# Patient Record
Sex: Female | Born: 1985 | Race: White | Hispanic: No | Marital: Single | Smoking: Current every day smoker
Health system: Southern US, Community
[De-identification: ages and names within clinical notes are randomized; demographics above are authoritative.]

## PROBLEM LIST (undated history)

## (undated) DIAGNOSIS — J45909 Unspecified asthma, uncomplicated: Secondary | ICD-10-CM

## (undated) DIAGNOSIS — T7840XA Allergy, unspecified, initial encounter: Secondary | ICD-10-CM

## (undated) DIAGNOSIS — G8929 Other chronic pain: Secondary | ICD-10-CM

## (undated) DIAGNOSIS — M549 Dorsalgia, unspecified: Secondary | ICD-10-CM

## (undated) DIAGNOSIS — M797 Fibromyalgia: Secondary | ICD-10-CM

## (undated) DIAGNOSIS — F319 Bipolar disorder, unspecified: Secondary | ICD-10-CM

## (undated) DIAGNOSIS — F32A Depression, unspecified: Secondary | ICD-10-CM

## (undated) DIAGNOSIS — F329 Major depressive disorder, single episode, unspecified: Secondary | ICD-10-CM

## (undated) HISTORY — DX: Allergy, unspecified, initial encounter: T78.40XA

## (undated) HISTORY — DX: Other chronic pain: G89.29

## (undated) HISTORY — DX: Depression, unspecified: F32.A

## (undated) HISTORY — DX: Unspecified asthma, uncomplicated: J45.909

## (undated) HISTORY — DX: Dorsalgia, unspecified: M54.9

## (undated) HISTORY — DX: Major depressive disorder, single episode, unspecified: F32.9

## (undated) HISTORY — PX: WISDOM TOOTH EXTRACTION: SHX21

---
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
## 2007-12-30 ENCOUNTER — Ambulatory Visit: Payer: Self-pay | Admitting: Obstetrics & Gynecology

## 2008-03-02 ENCOUNTER — Encounter: Payer: Self-pay | Admitting: Obstetrics and Gynecology

## 2008-03-02 ENCOUNTER — Ambulatory Visit: Payer: Self-pay | Admitting: Obstetrics and Gynecology

## 2009-05-15 ENCOUNTER — Inpatient Hospital Stay (HOSPITAL_COMMUNITY): Admission: AD | Admit: 2009-05-15 | Discharge: 2009-05-16 | Payer: Self-pay | Admitting: Obstetrics & Gynecology

## 2009-05-22 ENCOUNTER — Inpatient Hospital Stay (HOSPITAL_COMMUNITY): Admission: AD | Admit: 2009-05-22 | Discharge: 2009-05-22 | Payer: Self-pay | Admitting: Obstetrics & Gynecology

## 2009-12-19 ENCOUNTER — Encounter: Payer: Self-pay | Admitting: Family Medicine

## 2009-12-19 ENCOUNTER — Ambulatory Visit (HOSPITAL_COMMUNITY): Admission: RE | Admit: 2009-12-19 | Discharge: 2009-12-19 | Payer: Self-pay | Admitting: Family Medicine

## 2010-01-12 ENCOUNTER — Ambulatory Visit (HOSPITAL_COMMUNITY): Admission: RE | Admit: 2010-01-12 | Discharge: 2010-01-12 | Payer: Self-pay | Admitting: Family Medicine

## 2010-01-29 ENCOUNTER — Ambulatory Visit (HOSPITAL_COMMUNITY): Admission: RE | Admit: 2010-01-29 | Discharge: 2010-01-29 | Payer: Self-pay | Admitting: Family Medicine

## 2010-01-29 ENCOUNTER — Encounter: Payer: Self-pay | Admitting: Family Medicine

## 2010-05-01 ENCOUNTER — Ambulatory Visit: Payer: Self-pay | Admitting: Gynecology

## 2010-05-01 ENCOUNTER — Inpatient Hospital Stay (HOSPITAL_COMMUNITY): Admission: AD | Admit: 2010-05-01 | Discharge: 2010-05-02 | Payer: Self-pay | Admitting: Family Medicine

## 2010-05-04 ENCOUNTER — Ambulatory Visit: Payer: Self-pay | Admitting: Advanced Practice Midwife

## 2010-05-04 ENCOUNTER — Inpatient Hospital Stay (HOSPITAL_COMMUNITY): Admission: AD | Admit: 2010-05-04 | Discharge: 2010-05-05 | Payer: Self-pay | Admitting: Obstetrics and Gynecology

## 2010-06-23 ENCOUNTER — Inpatient Hospital Stay (HOSPITAL_COMMUNITY): Admission: AD | Admit: 2010-06-23 | Discharge: 2010-06-23 | Payer: Self-pay | Admitting: Obstetrics & Gynecology

## 2010-07-07 ENCOUNTER — Inpatient Hospital Stay (HOSPITAL_COMMUNITY): Admission: AD | Admit: 2010-07-07 | Discharge: 2010-07-09 | Payer: Self-pay | Admitting: Obstetrics and Gynecology

## 2010-11-04 ENCOUNTER — Encounter: Payer: Self-pay | Admitting: Family Medicine

## 2010-11-15 ENCOUNTER — Other Ambulatory Visit: Payer: Self-pay | Admitting: Obstetrics and Gynecology

## 2010-12-27 LAB — CBC
HCT: 32.2 % — ABNORMAL LOW (ref 36.0–46.0)
HCT: 33.6 % — ABNORMAL LOW (ref 36.0–46.0)
HCT: 37.2 % (ref 36.0–46.0)
Hemoglobin: 11.1 g/dL — ABNORMAL LOW (ref 12.0–15.0)
Hemoglobin: 11.6 g/dL — ABNORMAL LOW (ref 12.0–15.0)
MCH: 32.1 pg (ref 26.0–34.0)
MCH: 32.2 pg (ref 26.0–34.0)
MCHC: 34.4 g/dL (ref 30.0–36.0)
MCHC: 34.5 g/dL (ref 30.0–36.0)
MCHC: 34.6 g/dL (ref 30.0–36.0)
MCV: 93.1 fL (ref 78.0–100.0)
MCV: 93.3 fL (ref 78.0–100.0)
Platelets: 234 K/uL (ref 150–400)
Platelets: 255 K/uL (ref 150–400)
Platelets: 294 10*3/uL (ref 150–400)
RBC: 3.46 MIL/uL — ABNORMAL LOW (ref 3.87–5.11)
RBC: 3.6 MIL/uL — ABNORMAL LOW (ref 3.87–5.11)
RDW: 13.7 % (ref 11.5–15.5)
RDW: 13.8 % (ref 11.5–15.5)
RDW: 13.9 % (ref 11.5–15.5)
WBC: 15.6 K/uL — ABNORMAL HIGH (ref 4.0–10.5)
WBC: 21.8 K/uL — ABNORMAL HIGH (ref 4.0–10.5)

## 2010-12-27 LAB — SYPHILIS: RPR W/REFLEX TO RPR TITER AND TREPONEMAL ANTIBODIES, TRADITIONAL SCREENING AND DIAGNOSIS ALGORITHM: RPR Ser Ql: NONREACTIVE

## 2010-12-27 LAB — RH IMMUNE GLOB WKUP(>/=20WKS)(NOT WOMEN'S HOSP): Fetal Screen: NEGATIVE

## 2010-12-27 LAB — URINALYSIS, DIPSTICK ONLY
Ketones, ur: 40 mg/dL — AB
Leukocytes, UA: NEGATIVE
Nitrite: NEGATIVE
Protein, ur: NEGATIVE mg/dL
Specific Gravity, Urine: 1.015 (ref 1.005–1.030)
Urobilinogen, UA: 0.2 mg/dL (ref 0.0–1.0)
pH: 7 (ref 5.0–8.0)

## 2010-12-29 LAB — URINALYSIS, ROUTINE W REFLEX MICROSCOPIC
Bilirubin Urine: NEGATIVE
Glucose, UA: NEGATIVE mg/dL
Glucose, UA: NEGATIVE mg/dL
Ketones, ur: NEGATIVE mg/dL
Nitrite: NEGATIVE
Specific Gravity, Urine: 1.025 (ref 1.005–1.030)
pH: 6 (ref 5.0–8.0)

## 2010-12-29 LAB — URINE MICROSCOPIC-ADD ON

## 2010-12-29 LAB — URINE CULTURE: Colony Count: 30000

## 2010-12-29 LAB — CBC
HCT: 32.6 % — ABNORMAL LOW (ref 36.0–46.0)
MCH: 31.5 pg (ref 26.0–34.0)
Platelets: 248 10*3/uL (ref 150–400)
RBC: 3.57 MIL/uL — ABNORMAL LOW (ref 3.87–5.11)
RDW: 14 % (ref 11.5–15.5)
WBC: 10.9 10*3/uL — ABNORMAL HIGH (ref 4.0–10.5)

## 2010-12-29 LAB — WET PREP, GENITAL: Yeast Wet Prep HPF POC: NONE SEEN

## 2011-01-19 LAB — HCG, QUANTITATIVE, PREGNANCY
hCG, Beta Chain, Quant, S: 12 m[IU]/mL — ABNORMAL HIGH (ref <5)
hCG, Beta Chain, Quant, S: 19 m[IU]/mL — ABNORMAL HIGH (ref <5)

## 2011-01-19 LAB — CBC
Hemoglobin: 12.4 g/dL (ref 12.0–15.0)
MCHC: 34.9 g/dL (ref 30.0–36.0)
RBC: 3.9 MIL/uL (ref 3.87–5.11)
WBC: 13.7 10*3/uL — ABNORMAL HIGH (ref 4.0–10.5)

## 2011-01-19 LAB — GC/CHLAMYDIA PROBE AMP, GENITAL: Chlamydia, DNA Probe: NEGATIVE

## 2011-01-19 LAB — HCG, SERUM, QUALITATIVE: Preg, Serum: POSITIVE — AB

## 2011-02-26 NOTE — Group Therapy Note (Signed)
NAME:  Sarah Mcclure, Sarah Mcclure NO.:  [id number]   MEDICAL RECORD NO.:  [id number]          PATIENT TYPE:  WOC   LOCATION:  [facility]                   FACILITY:  [facility]   PHYSICIAN:  Elsie Lincoln, MD      DATE OF BIRTH:  28-May-1986   DATE OF SERVICE:  12/30/2007                                  CLINIC NOTE   Patient is a 25 year old para 1 female who presents after a ASCUS Pap  smear with positive HPV.  Colpo revealed CIN 1, and pathology showed  detached fragment of atypical squamous metaplasia.  I called Dr. Dierdre Searles in  pathology, and __________ called it atypical squamous metaplasia because  it was just some random cells, and they were just oriented, and there  was no underlying stroma, so it was hard to say what type of thickness  it was, so it could be dysplasia, or it could just be some random cells  and some metaplasia.  Given that she is only 25 years old, and there was  good visualization of the T zone, we are going to elect to follow her  with Pap smears here at [facility] [place] by the MDs.  If  she does have an abnormal Pap smear again in two months, I think she  should have another colposcopy here at [facility].  She does need birth control because her IUD fell out during the last  colposcopy, and I gave her a prescription for NuvaRing.  Patient is  familiar with this method of birth control.  Patient is to come in two  months for a Pap smear.           ______________________________  Elsie Lincoln, MD     KL/MEDQ  D:  12/30/2007  T:  12/30/2007  Job:  [id number]
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
## 2011-03-01 NOTE — H&P (Signed)
NAME:  Sarah Mcclure, Sarah Mcclure                            ACCOUNT NO.:  [id number]   MEDICAL RECORD NO.:  [id number]                   PATIENT TYPE:  INP   LOCATION:  0100                                 FACILITY:  [facility]   PHYSICIAN:  Beverly Milch, MD                  DATE OF BIRTH:  [birth year]   DATE OF ADMISSION:  03/21/2004  DATE OF DISCHARGE:                         PSYCHIATRIC ADMISSION ASSESSMENT   IDENTIFYING DATA:  This 55-52/25-year-old female, who made some type of  personal arrangements with the 11th grade at [employer] to  finish when she could so that she could live in [place] with mother  rather than with grandmother in [place], is admitted voluntarily in  transfer from [facility] for inpatient  stabilization of depression and anxiety.  The patient was transferred to [facility]  from 1-2 day stay, June 2 and June 3, in transfer from [facility]  as her acute renal failure did not improve but actually exacerbated.  The  patient was vomiting and too physically ill to participate in mental health  treatment.  They did not give at [facility] but rather managed  electrolytes, hydration and nutrition through the course of her  spontaneously gradually remitting acute renal failure so that she did not  also require dialysis.  However, they did feel she could be trusted to  remain safe outside the hospital setting with the patient having only 24  hours without somatic complaints prior to discharge and with some  restoration of energy, interest in participation.  The patient indicates she  has not used any drugs in the interim, so that she has now been free from  marijuana for at least a week.  She has now not had any Lexapro for over a  week.  The patient did have a MRI of the brain and an EKG while at [facility] and her workup for acute renal failure was otherwise negative  except for the overdose of 55 Coricidin Cough & Cold  that necessitated her  original presentation to [facility] and the [facility].  Renal failure was concluded to be due to the overdose but remitting rather  than prerenal as [facility] initially suspected.  The patient also admitted  to overdosing with up to 5 Lexapro in the interim since her January 31, 2004  to February 06, 2004 [facility] hospitalization when she was at  [facility] and noted that she had been noncompliant with the Lexapro as she had run  out early.   HISTORY OF PRESENT ILLNESS:  The patient is known to me from her extensive  dictations of her last two-day hospitalization, including admission  assessment and discharge summary.  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  [facility] was worried that the patient also has an eating disorder though,  from reviewing again, with the patient and mother, the patient was  comfortable eating and likes to eat and has no body image distortion.  However, she has lost 10 pounds since her last admission, though she was in  acute renal failure when she was last admitted.  She had gained weight from  her first hospitalization the end of April, losing from 114 pounds to 110  pounds to 117 pounds during her March 15, 2004 admission.  She now returns at  107 pounds.  The patient has multiple contributing factors to her general  medical symptoms, especially with the gastrointestinal tract.  She may now  be having some SSRI discontinuation, though she only complained of some  dizziness and some headache 18 hours after admission.  The patient uses  cannabis with her older sister, who serves as a surrogate mother.  Mother is  no longer using alcohol and escaped a near-death experience from  consequences of her substance abuse with alcohol two years ago, according to  the patient.   Mother and the patient were opposed to the [facility] inpatient care and had avoided all medical care for three days after  her overdose with 55 Coricidin, self-medicating the patient with Phenergan  and monitoring by the grandmother, who is a nurse until she was sent by  [facility] back to the [facility].  The patient had taken  Zoloft one year ago and reported at [facility] that she had some improvement on  Zoloft but that it caused gastrointestinal side effects.  She had informed  me previously that Zoloft did not result in any improvement but did cause GI  side effects.  She has tolerated the Lexapro well but Lexapro was held by  nursing because of her vomiting the night before transfer to [facility] and then  was held by [facility] as they treated the acute renal failure as that had higher  priority than depression or any SSRI discontinuation symptoms.  She returns  in a good mood, being motivated to get better.  She does seem apprehensive  about returning to mother's home, even though she hesitates to acknowledge  such.  She has been in outpatient psychotherapy for 14 months with Emiliano Dyer at [facility] and has been seeing Dr. Marlou Porch at [facility] for psychiatry care since her hospitalization in late April at  [facility].  Mother and patient sought to see Emiliano Dyer and Dr. Marlou Porch instead of coming to the [facility]  after the patient's serious overdose but no immediate appointments were  available and [facility] insisted that the patient be  taken to [facility] because of her decompensation medically  following the overdose.   PAST MEDICAL HISTORY:  The patient's hypertension, on readmission to the  [facility], March 15, 2004 and again at [facility], has required p.r.n. hydralazine while at [facility] and she was discharged on a dose of  hydrochlorothiazide 12.5 mg daily as  1/2 of a 25 mg tablet.  As mentioned  above, the patient had a CT scan and not a MRI while at [facility], which was  negative and showed no evidence of a bleed and no evidence of any factor  contributing to vomiting and renal failure.  EKG was also normal and showed  no hypertensive changes, particularly no  chronicity to her elevated blood  pressure.  The patient's blood pressure was 144/97 and 135/81 on the day of  discharge from [facility].  Her weight was down from 51.6 to 46.9 kilograms.  Her  streptococcal antibodies were negative, showing no evidence of  glomerulonephritis.  Her creatinine appeared to be down to 1.9 from the  preceding level of 2.4 down from her 7.9 at the time of transfer to [facility].  Calorie count was underway and they feared the patient may have some eating  disorder symptoms, though overall they have given nutritional supplements  and the patient states that she loves to eat.  The patient has healing scars  from self-cutting but she has no active wounds except from intravenous sites  and venipunctures.  The patient had a blood pressure of 151/101 when  admitted on March 15, 2004 to the [facility].  She had been  drinking mostly water at home and her urine sodium was up at 47 along with  her urine protein being 30 and she had some blood in her urine by the time  of discharge.  The patient thinks something is wrong with her liver at the  time of her last readmit and seems to identify with her mother.  Some of her  vomiting may be anxious in origin.  She had taken Zantac in the past for pre-  ulceration of a peptic nature in the fourth grade.  She had multiple  contusions from falls when intoxicated with Coricidin.  She had an abortion  in January of 2005.  She has allergic rhinitis requiring Clarinex p.r.n.  She has had no syncope or seizures.  She has had no heart murmur or  arrhythmia.   REVIEW OF SYSTEMS:  The patient has no somatic complaints at the time of her   readmission but subsequently develops headache, dizziness and vomiting again  episodically.  She has never had porphyria or other endocrine or metabolic  diagnoses.  She did not have any known migraine.  She has hyponatremia in  the emergency room at [facility] with a sodium of 130 that normalized  during her hospital stay.  Her potassium had also been low and normalized.  Her phosphorus was rising during her last hospital stay and, at [facility], these  abnormalities were all normal and regulated by the time of transfer except  for elevated creatinine.  She has no dysuria or arthralgia.   IMMUNIZATIONS:  Up to date.   FAMILY HISTORY:  There is a maternal family history of depression and  paternal family history of anxiety, according to the last admission.  Mother  has had alcoholism, as has father with father being emotionally abusive to the family.  Mother is sober now but we are not certain about father.  Father is not involved in the patient's life currently.  Older sister uses  cannabis with the patient, though the patient predominantly uses her own  supply and that with friends.  The patient indicates using cannabis to cope  and, although she states she is planning to cut down greatly, she will not  yet state that she is going be totally abstinent.  The patient remains  sexually active.  She has switched from the birth control patch to the birth  control pill and then has discontinued all, though apparently planning to  start some type of contraceptive in an upcoming cycle.  The patient did not  get back to school for her EOGs or other exams  but overdosed the day before  she and mother were to come for such testing.  The patient indicated that  she overdosed Mar 11, 2004 because she was failing in all of her  relationships outside of the family and boyfriend.  The patient has not been  more specific in this regard.  Mother indicates the patient will have some  type of pre-employment and  pre-hire education course over the next month and  then will finish summer school work to complete the 11th grade, though the  patient is hoping she can work with animals at [employer] in a job pretty  soon.   ASSETS:  The patient is intellectually capable of benefiting from treatment.   MENTAL STATUS EXAM:  Admission weight is 107 pounds with height of 61  inches.  Blood pressure 156/99 and heart rate of 69 (sitting) and 140/101  with heart rate of 104 (standing).  On the morning after admission, her  blood pressure was 127/84 with heart rate of 87 (supine) and 133/94 with  heart rate of 105 (standing).  The patient is not somatic in her complaints  on return and she indicates a comfort being at the hospital rather than a  conflict.  She does acknowledge that she is not suicidal but [facility] had not  been able to trust her during her inpatient stay on nephrology.  The patient  states she is doing better and functioning better but she does then resume  somatoform concerns, particularly as mother arrives.  The patient had been  more somatic during her last hospital stay with mother and grandmother.  The  patient noted a willingness to resume antidepressant and could agree that  Lexapro would have been a good medicine, although she was open to whatever  was necessary.  The patient reemphasized that she is sober and that she is  proud of being able to go for over a week without cannabis, though she is  not willing to commit to total abstinence but rather to cutting down.  She  and I worked on the need to be able to commit to total sobriety.  [facility] has  been concerned that the patient shows some diathesis towards suicidality but  she has not been suicidal or self-injurious.  The patient is less depressed  but more anxious on return but she did not have somatic complaints until approximately 18 hours into her hospitalization, initially complaining of  dizziness and headache and needing to lay down  and then, subsequently,  complaining of an episode of nausea and vomiting with greasy food.   IMPRESSION:   AXIS I:  1. Dysthymic disorder, early onset, severe with atypical features.  2. Generalized anxiety disorder with social anxiety features.  3. Cannabis dependence.  4. Coricidin abuse.  5. Parent-child problem.  6. Other interpersonal problem.  7. Other specified family circumstances.  8. Noncompliance with treatment.   AXIS II:  Diagnosis deferred.   AXIS III:  1. Resolving acute renal failure due to Coricidin overdose.  2. Hypertension associated with acute renal failure due to Coricidin     overdose.  3. Allergic rhinitis.  4. Allergy to PENICILLIN.  5. Ten-pound weight loss associated with nausea and vomiting, likely     associated with above #6.  6. Pregnancy termination by abortion in January of 2005.  7. History of pre-ulcerative gastritis.  8. Recent discontinuation of birth control pills.  9. Lexapro discontinuation symptoms.   AXIS IV:  Stressors:  Family--severe to extreme,  acute and chronic; peer  relations--severe, acute and chronic; school--severe, acute; phase of life--  severe, acute; medical--severe, acute.   AXIS V:  Global Assessment of Functioning on admission 44; highest in last  year 70.   PLAN:  The patient was admitted for inpatient adolescent psychiatric and  multidisciplinary, multimodal behavioral health treatment in a team-based  program at a locked psychiatric unit.  Will reinstitute Lexapro at 10 mg  nightly.  Will use Zofran as needed for nausea and vomiting as Phenergan did  not work well last admission and mother declines Ativan for this purpose.  The patient is off of birth control pills but did plan to restart  contraception in the future.  Will address behavioral nutrition, start a  multivitamin and monitor blood pressure and mood.  Cognitive behavioral  therapy, substance abuse intervention and family therapy are continued.    ESTIMATED LENGTH OF STAY:  Three to four days with target symptoms for  discharge being stabilization of depression and anxiety, re-establishment of  Lexapro, generalization of safety and containment for safe participation in  outpatient treatment and stabilization of behavioral nutrition.                                               Beverly Milch, MD    GJ/MEDQ  D:  03/22/2004  T:  03/22/2004  Job:  [id number]
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
## 2011-03-01 NOTE — Discharge Summary (Signed)
NAME:  Sarah Mcclure, Sarah Mcclure                            ACCOUNT NO.:  [id number]   MEDICAL RECORD NO.:  [id number]                   PATIENT TYPE:  INP   LOCATION:  0100                                 FACILITY:  [facility]   PHYSICIAN:  Beverly Milch, MD                  DATE OF BIRTH:  [birth year]   DATE OF ADMISSION:  03/21/2004  DATE OF DISCHARGE:  03/24/2004                                 DISCHARGE SUMMARY   IDENTIFYING DATA:  This 40-41/25-year-old female, who is attempting to  complete her 11th grade year at [employer], was admitted  voluntarily in transfer from [facility] pediatric nephrology for  stabilization of depression and suicide risk as her previous treatment at  the [facility] June 2 and March 16, 2004 was interrupted by her  acute renal failure, requiring transfer to [facility].  It was not possible for the  patient to work on her mental health needs while at [facility], though she did not  require dialysis.  She was discharged on hydrochlorothiazide 12.5 mg daily,  though she had required hydralazine p.r.n. to control her blood pressure  during her stay at [facility].  She had been discontinued from other medications,  including her Lexapro, though they had no contraindication or objection to  her being on the Lexapro, but could not attend to the mental health needs  while being treated for acute renal failure.  The patient did acknowledge,  while at [facility], that she had overdosed with up to 5 Lexapro in the interim  since her hospitalization at the [facility] at the end of  April of 2005 and she therefore had been noncompliant with Lexapro at times  including because she had run out too soon.  The patient continued to use  cannabis daily and her renal failure was concluded to be due to a suicide  attempt with 55 Coricidin Cold & Cough tablets, though she also uses  Coricidin recreationally.  For full details, please see the typed admission  assessment.   HISTORY OF PRESENT ILLNESS:  The patient is concluded to have cannabis  dependence as being more evident than during her April hospitalization when  she was more depressed and cannabis abuse was concluded.  The patient had  gained weight from that hospitalization at the end of April, having been  admitted at the end of April with weight of 114, dropping to 110 pounds  before discharge.  She gained weight to 117 pounds at the time of her  admission March 15, 2004 but, while at [facility], dropped to 107 pounds with [facility]  worrying that she may have an eating disorder, though the patient had been  vomiting continually during much of her acute renal failure.  The patient  had attempted to wait out the patient's overdose consequences at  grandmother's home with the patient overdosing Mar 11, 2004 the night  before  she was to return to [place] and take tests at [facility].  She reported that she had been planning suicide for several weeks,  due to disappointment with relationships, apparently outside the family.  She continues to use cannabis with her older sister, who is a surrogate  mother.  Mother apparently remains sober from alcohol, having nearly died  from alcohol consequences a couple of years ago.  The patient has been in  therapy with Emiliano Dyer for 14 months and is now seeing Dr. Marlou Porch for  psychiatric follow-up.  The family was willing to see them after her  overdose Mar 11, 2004 but was not willing to be at the [facility] again, though she was required to do so once the [facility] got her to [facility].  Her EKG and CT of the head at [facility]  were normal, though her blood pressure was elevated.  Her creatinine was  apparently down to the 1.9 to 2.4 range from the 7.9 at the time of  transfer.  She had hyponatremia in the emergency room at [facility] March 15, 2004 with a sodium of 130 when in renal failure and her phosphorus was   rising as well as her potassium with the phosphorus being elevated by the  time of her transfer.   INITIAL MENTAL STATUS EXAM:  The patient denied suicidality but [facility]  concluded that she was not trustworthy during her stay there.  However, her  mood was improved and she was more willing to work on her mental health and  behavioral concerns on return to the [facility] from [place].  She  was much less somatic on return to the [facility] but, by 24  hours into her admission, she was having multiple somatic complaints  including vomiting again.  She was more anxious but less depressed overall  and still not working effectively on her problems but more seeming to depend  on mother and grandmother to nurture her because she was sick.   LABORATORY DATA:  During her readmission this time, the patient had only one  basic metabolic panel on March 23, 2004.  Her creatinine was restored to  normal at 1.4 with reference range 0.5-1.7 and BUN 20.  Her potassium was  low at 2.8 with reference range 3.5-5.5.  She had started vomiting again for  18-24 hours preceding that test, though only intermittently and she was on  hydrochlorothiazide for her hypertension.  Sodium was normal at 137,  chloride 96, glucose 89, CO2 28, calcium 9.8.   HOSPITAL COURSE AND TREATMENT:  General medical exam, by Vic Ripper,  P.A.-C., noted excision sites for suspicious nevi on the left forearm.  There was menarche at age 4 and menses are regular.  She is sexually active  and had abortion of a pregnancy in January of 2005.  She had been anemic in  the past.  Her last GYN exam was one month ago [facility] the [facility].  Mickie Deery Adams, P.A.-C. preferred nephrology consult, though the patient  was not to be in the hospital more than a brief [facility] sponsorship and she had just been discharged from [facility] pediatric nephrology.  Mickie Deery Adams, P.A.-C. ordered  clonidine 0.1 mg for a  blood pressure of 159/108 despite hydrochlorothiazide having been increased  that morning to 25 mg daily.  There was also the need to supplement  potassium chloride elixir for low potassium, receiving 40 mEq the first day  and 20 mEq daily thereafter.  The patient did find that Zofran stabilized  her nausea and vomiting which may have been associated with headaches and  possibly Lexapro discontinuation.  The patient's mood was improved and she  was working much more effectively on problems and generalizing this to home.  She was motivated to complete the remainder of her school testing for the  11th grade and she and mother had specific plans about summer activities  including preparation for employment.  She was free of suicidal ideation at  the time of discharge.   FINAL DIAGNOSES:   AXIS I:  1. Dysthymic disorder, early onset, severe with atypical features.  2. Generalized anxiety disorder with social anxiety features.  3. Cannabis dependence.  4. Coricidin abuse.  5. Parent-child problem.  6. Other interpersonal problem.  7. Other specified family circumstances.  8. Noncompliance with treatment.   AXIS II:  Diagnosis deferred.   AXIS III:  1. Resolving acute renal failure due to Coricidin overdose.  2. Hypertension associated with acute renal failure.  3. Allergic rhinitis.  4. Allergy to PENICILLIN.  5. Weight loss.  6. Pregnancy termination by abortion in January of 2005.  7. Recent discontinuation of birth control pills and Lexapro.  8. Hypokalemia, at least partly associated with hydrochlorothiazide     treatment.   AXIS IV:  Stressors:  Family--severe to extreme, acute and chronic; peer  relations--severe, acute and chronic; school--severe, acute; phase of life--  severe, acute; medical--severe, acute.   AXIS V:  Global Assessment of Functioning on admission 44; highest in last  year 70; discharge Global Assessment of Functioning 54.    CONDITION ON DISCHARGE:  The patient was discharged to mother in improved  condition free of suicidal ideation.  The patient was tolerating her  medications well by the time of discharge with resolution of vomiting.  The  patients' blood pressure on admission had been 141/97 with heart rate of 56  (sitting) and 130/91 with heart rate of 70 (standing).  Her maximum blood  pressure, during the hospital stay, was 147/109 (standing).  On the morning  of discharge, her supine blood pressure was 113/73 with heart rate of 71 and  standing blood pressure 118/84 with heart rate of 95.   DISCHARGE MEDICATIONS:  1. Lexapro 10 mg nightly.  2. Zofran 4 mg every six hours as needed for nausea.  3. Hydrochlorothiazide 25 mg every morning.  4. Potassium chloride elixir 20 mEq every morning.   DIET:  The patient was discharged on a bland diet to tolerance to gradually  restore to a low-sodium diet.  ACTIVITY:  She has no restrictions on physical activity.   FOLLOW UP:  She is to abstain from any use of cannabis, Coricidin or other  substances of abuse.  She needs substance abuse treatment at the [facility].  She will have her individual and family therapy with Emiliano Dyer  with mother setting up that follow-up appointment.  She will see Dr. Marlou Porch  and Elon Spanner for psychiatric medication management with appointment  March 29, 2004 at 10 a.m. and April 04, 2004 at 16:30.  She will seek  substance abuse treatment at the [facility] and will have medical  management, including of her hypertension with Dr. Lucky Cowboy.  Crisis  and safety plans are outlined if needed.  Beverly Milch, MD    GJ/MEDQ  D:  03/26/2004  T:  03/26/2004  Job:  [id number]   cc:   Lucky Cowboy, M.D.  [address], [address]  [place], [place] [address]  Fax: [phone]   Emiliano Dyer  [address]  [place], [place] [address]   Idalia Needle  [address], [address]  [place] [address]  Fax: [phone]   [facility]  [facility]  [address] [place], [place] [address]   [facility]  [address] [place], [place] [address]

## 2011-03-01 NOTE — Discharge Summary (Signed)
NAME:  Sarah Mcclure, Sarah Mcclure                            ACCOUNT NO.:  [id number]   MEDICAL RECORD NO.:  [id number]                   PATIENT TYPE:  INP   LOCATION:  0105                                 FACILITY:  [facility]   PHYSICIAN:  Beverly Milch, MD                  DATE OF BIRTH:  12-Jun-1986   DATE OF ADMISSION:  03/15/2004  DATE OF DISCHARGE:  03/16/2004                                 DISCHARGE SUMMARY   PATIENT IDENTIFICATION:  This 25 year old female who did not complete the  11th grade at [employer] yet, then mother states she has  the missing exams all structured for the future, is admitted voluntarily in  transfer from [facility] Room on a [facility] sponsorship after Child Protective Services report  initiated intervention in the emergency room for inpatient stabilization of  suicide attempt Mar 11, 2004.  The patient overdosed with 55 Coricidin Cough  and Cold in order to kill herself Mar 11, 2004, in the evening at home in  [place] in [employer].  Mother learned of the overdose the  following day and mother and grandmother apparently nursed the patient  primarily at grandmother's house in [place] May 30 through March 14, 2004,  taking the patient to [facility] ER at the insistence of  [facility] when they did contact mental health seeking  further interventions.  They were trying to avoid psychiatric  hospitalization and stated this both to [facility] and to  [facility] Emergency Room.  The patient reported that she intended to kill  herself because of relationship problems apparently outside of the family  and boyfriend.  She was seeking an appointment with Dr. Marlou Porch at [facility] or with Emiliano Dyer, her therapist of 15 months.  She  had been at the [facility] April 19 through February 06, 2004,  after cutting  herself multiple times, requiring sutures in two of the  lacerations as a suicide attempt at that time.  The patient has been  compliant with Lexapro 10 mg nightly most of time though she has had some  times of being noncompliant.  She has had no definite SSRI discontinuation  symptoms or side effects from Lexapro though she did have some  gastrointestinal disturbance from Zoloft one year ago prescribed by Lucky Cowboy, M.D., at [facility].  For  full details, please see the typed admission assessment.   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  She has atypical features to her depression.  She has used cannabis daily for two years and has used Coricidin  episodically including liquid and pill formulations predominantly  recreationally but now as  a suicide attempt.  The patient suggests that  things are going better with mother and older sister though apparently older  sister uses cannabis with the patient sometimes but the patient states she  generally buys her own or obtains it from the friends that she was killing  herself over.  The patient is apparently discontinuing her birth control  pill and planning to start a birth control ring.  She has used Claritin in  the past and more recently has had Phenergan on an outpatient basis  apparently from grandmother that she has been using for nausea and vomiting.  She did fall multiple times when she was intoxicated with Coricidin with  abrasions and contusions, particularly on the forehead and extremities.  She  had some peptic preulceration in the fourth grade and has taken Zantac at  times p.r.n. for GI difficulties.  She reportedly had a blood reaction as an  infant that has not been clarified.  Her renal function studies were normal  during her last  hospitalization at the end of April including creatinine  0.7.  She has gained weight since then, having lost weight during her last  hospitalization from 113 pounds January 31, 2004, to 110 pounds on February 06, 2004.  However, she is readmitted now with a weight of 117 pounds.  She had  an abortion in January 2005 for pregnancy with her current boyfriend.  She  has had precancerous birthmarks excised from the left forearm in two places.  She has allergic rhinitis.  She had asthma during childhood.  She is  allergic to all PENICILLINS.  They now acknowledge a family history of  depression on the maternal side and anxiety on the paternal though during  her last admission they only acknowledged alcoholism in mother and father,  reporting that mother is now sober after nearly dying from alcohol  complications to the liver two years ago.  The patient states that the  family has decided that working with animals in some form of employment will  be the best solution to the patient's relationship problems.  She has had  some stealing of lighters, candy, and cold pills in the past and has missed  a lot of school by skipping, particularly when living with grandmother in  [place] prior to her last psychiatric hospitalization.   INITIAL MENTAL STATUS EXAM:  The patient seems recently dependent on the  nursing care of mother and grandmother.  She states she has now realized  that they really do need her and love her and she has a reason to live.  She  states she can get over the problems with her friends.  She has no  hallucinations or delusions.  She has no manic symptoms.  Her chronic  depression is less severe than five days ago.  Her generalized anxiety is  better on Lexapro though her depression has been better at times and worse  at others.  Both the patient and mother devalue her last hospitalization  seemingly in the service of explaining of why they did not want her to re-  enter psychiatric  hospital.   LABORATORY FINDINGS:  CBC in the [facility] Room at 1821 hours  March 14, 2004, revealed white count elevated at 13,600 with hemoglobin normal  at 13.6, MCV at 85, and platelet count 295,000.  In the emergency room at  the same time, the comprehensive metabolic panel revealed sodium low at 130  with lower limit of normal 135,  glucose 110, BUN 41 with upper limit of  normal 23, and creatinine 7.4 with reference range 0.5-1.7.  Albumin was  slightly low at 3.4 with lower limit of normal 3.5 while calcium was normal  at 9, AST 28, ALT 36, potassium 3.8, chloride 94, and CO2 22 with reference  range 19-32.  During her last hospitalization January 31, 2004, the patient's  sodium was 137, potassium 3.4 with lower limit of normal 3.5, chloride 109,  CO2 23, creatinine 0.7, and BUN 7 with calcium 8.8.  Repeat comprehensive  metabolic panel March 15, 2004, at 1300 hours revealed sodium borderline low  at 134, BUN elevated at 45, creatinine 7.9 with glucose 127, potassium 3.7,  chloride 97, and CO2 24.  On the day of discharge, basic metabolic panel  revealed sodium normal at 137, potassium 4.4, chloride 97, and CO2 27.  Glucose was elevated at 103 fasting, BUN 44, creatinine 7.9, and calcium  9.7.  Urinalysis in the emergency room had urine specific gravity of 1.005,  small amount of occult blood, protein of 30, 0-2 wbc's, 0-2 rbc's, and rare  bacteria with pH 6.5.  The urinalysis was repeated at 1130 on March 15, 2004,  and at 0600 on March 16, 2004, with the urine specific gravity fixed at 1.005  or 1.006, pH 6.5, small occult blood on March 15, 2004, and large March 16, 2004, protein 30 on March 15, 2004, and negative on March 16, 2004, with 0-2  wbc's and 0-2 rbc's on both.  Serum osmolality on the day of discharge was  299 with reference range 275-300 while urine osmolality was 185 with  reference range [phone].  CK was normal at 144 with reference range 24-195.  Urine pregnancy test  was negative.  Free T4 was normal at 1.23 and TSH at  0.879.  Urine drug screen was positive for tetrahydrocannabinol; otherwise  negative.  In the emergency room, acetaminophen was less than 10 and  salicylate less than 4.  At the time of discharge, a urine for potassium,  sodium, and creatinine was being collected but not definitely sent to the  lab before her discharge.   HOSPITAL COURSE AND TREATMENT:  General medical exam at the time of  admission revealed abrasion more than contusion on the right forehead, both  hands, and the lower extremities.  There was full range of motion of the  right shoulder, which was also mechanically painful, having fallen on her  shoulder as well when intoxicated with Coricidin.  The patient's blood  pressures were elevated in the emergency room with maximum value of 151/101.  At the [facility], the patient's height was 62 inches and weight 117 pounds with  blood pressure 141/97 and heart rate 56 sitting on  admission and standing blood pressure 130/91 with heart rate of 70.  At the  time of discharge, supine blood pressure was 123/78 with heart rate of 54  and standing blood pressure 129/98 with heart rate of 68.  The patient had a  urine output of approximately 720 mL over the 12 hours prior to scheduling  her discharge though she vomited approximately 200 mL and drank about 300  mL.  The patient's electrocardiogram in the emergency room revealed sinus  bradycardia with short PR of 104 msec with rate of 56, PR 108, QRS of 104,  and QTc of 443 msec.  The patient continued vomiting despite allowing bed  rest though we did attempt several times to engage  her in the treatment  program and in the family therapy activities.  Family therapy interventions  had to be carried out in the patient's room, therefore.  She did receive  Pepcid but did not receive her Lexapro at bedtime because of vomiting.  Wound care was provided for abrasions with  Neosporin.  She had no other  medications except Phenergan 25 mg two doses, Maalox for one dose, and  Vistaril 50 mg for one dose during her hospital stay.  Therefore, she did  not receive any Lexapro though she had been taking it regularly three days  prior to admission by family history though noncompliant for a few days  intermittently prior to that.  The patient did report some pain that she  thought was in her liver and she seemed to fear that she had done liver  damage like her mother had with alcohol in the past.  Mother thought the  pain was the patient's kidneys.  The patient did not manifest definite SSRI  withdrawal nor other drug withdrawal including from cannabis during her  hospital stay.  She did improve in her mood though still remaining mildly to  moderately dysphoric by the time of discharge.  Her anxiety was much less  than last hospitalization.  Her suicidal ideation remitted even by the time  of hospitalization.  The patient may have been taking some vitamins prior to  admission.  Family therapy and individual therapy were carried out,  particularly relative to anger management, substance abuse intervention and  sobriety, parenting and parent management, as well as compliance.  Mother  did make contact with the patient's outpatient psychotherapist and we  attempted to exchange information with Dr. Deniece Portela Herrick's office including  for setting up subsequent appointments.  Although the PDR mentions that case  reports have been made of persons taking Lexapro having experienced acute  renal failure, they could make no causual association and therefore, there  has been no definite link of Lexapro to acute renal failure.  I did share  this with mother though I have no reservation to the patient continuing  Lexapro though if she goes into more severe renal failure, the dose may need  to be carefully monitored.  As the acute renal failure became more symptomatic than the  suicide attempt and depression, the patient's need for  medical treatment, particularly regarding elevating phosphorus and sustained  elevation of creatinine and BUN require pediatric nephrology treatment.  This was discussed with pediatrics at [facility] including house  staff as well as with Dr. Gregary Signs Hipp at [facility].  Treatment was  available at [facility] and was felt to be best by all and the patient  was transferred there with mother's participation in arranging such as well.  [facility] was notified, particularly regarding their  placement at [facility] for four days.   FINAL DIAGNOSES:   AXIS I:  1. Dysthymic disorder, early onset, severe with atypical features.  2. Generalized anxiety disorder with social anxiety features.  3. Cannabis dependence.  4. Coricidin abuse.  5. Parent-child problem.  6. Other interpersonal problem.  7. Other specified family circumstances.   AXIS II:  Diagnosis deferred.   AXIS III:  1. Acute renal failure.  2. Vomiting and dehydration in the emergency room with failure to respond to     rehydration in the emergency room before transfer to the [facility].  3. Coricidin overdose.  4. Multiple contusions and abrasions.  5. Allergic rhinitis.  6. Allergy to penicillin.  7. History of preulcerative gastritis.  8. Pregnancy managed with abortion in January 2005.  9. Recent discontinuation of birth control pills.   AXIS IV:  Stressors: Family- severe to extreme, acute and chronic; peer  relations- severe, acute and chronic; school- severe, acute; phase of life-  severe, acute.   AXIS V:  Global assessment of functioning at the time of admission was 40  with highest in the last year 70 and discharge global assessment of  functioning 49.   PLAN:  Child Protective Services reporting was initiated in the emergency  room relative to the failure of the family to seek medical  care for the  patient for the overdose and the pattern of suicide attempts prior to crisis  problem-solving.  The patient is in ongoing therapy with Emiliano Dyer in  [place] and they have established communication regarding upcoming  appointment as soon as the patient is released for psychotherapy.  The  patient sees Dr. Idalia Needle and [facility] will call  an appointment to mother for medication management regarding Lexapro.  The  patient is transferred to [facility] in [facility],  Dr. Gregary Signs Hipp.  Continuation of Lexapro 10 mg nightly, quantity #30 with no  refill prescribed is recommended unless pediatric nephrology advises against  continuing it or needs to significantly reduce the dose during the patient's  treatment for acute renal failure.  Crisis and safety plans are outlined  with the patient and mother.  Diet modifications are expected with the patient having received predominantly clear liquids during her hospital  stay.  [facility] is notified of the transfer regarding  the sponsorship.  The patient is psychiatrically stable and cooperative for  the necessary medical care and is emergently transferred to [facility] in  [place].  She is not suicidal at the time of transfer.  Her mood is  improved over admission but still mildly to moderately dysphoric.  Her  anxiety is much less than during her last hospitalization.  There is a  signed release on the chart for the courtesy copies.                                               Beverly Milch, MD    GJ/MEDQ  D:  03/16/2004  T:  03/16/2004  Job:  [id number]   cc:   Elige Radon, M.D.  [facility]  [address]  [place], [place] [address]  Fax [phone]   Emiliano Dyer  [address]  [place], [place] [address]   [facility]  [address]  [place], [place] [address]   Idalia Needle  [address]  [place]  [place] [address]  Fax: [phone]

## 2011-10-15 DIAGNOSIS — G8929 Other chronic pain: Secondary | ICD-10-CM

## 2011-10-15 HISTORY — DX: Other chronic pain: G89.29

## 2012-02-04 IMAGING — US US OB NUCHAL TRANSLUCENCY 1ST GEST
1 series · 14 of 28 positions shown · non-contrast
Comparison: none

OBSTETRICAL ULTRASOUND:
 This ultrasound was performed in The [HOSPITAL], and the [facility] report will be stored to [REDACTED] PACS.  This report is also available in [HOSPITAL]?s accessANYware.

[Series 1: us ob nuchal translucency 1st gest · 14 of 38 slices shown]
[im 2/38]
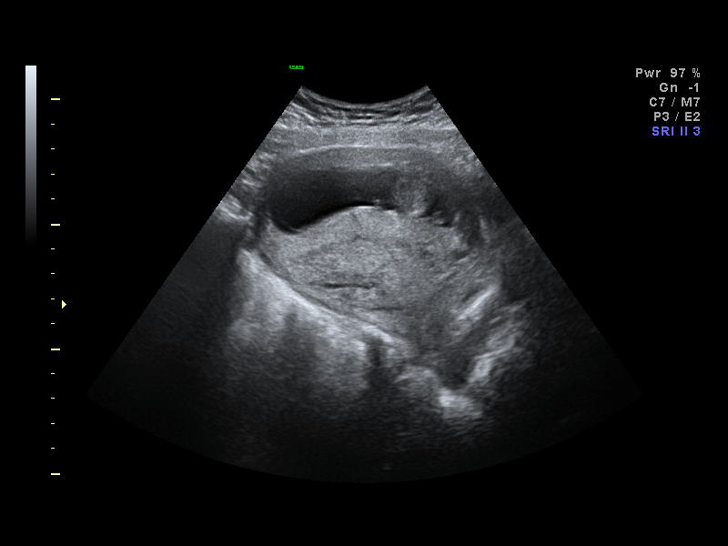
[im 5/38]
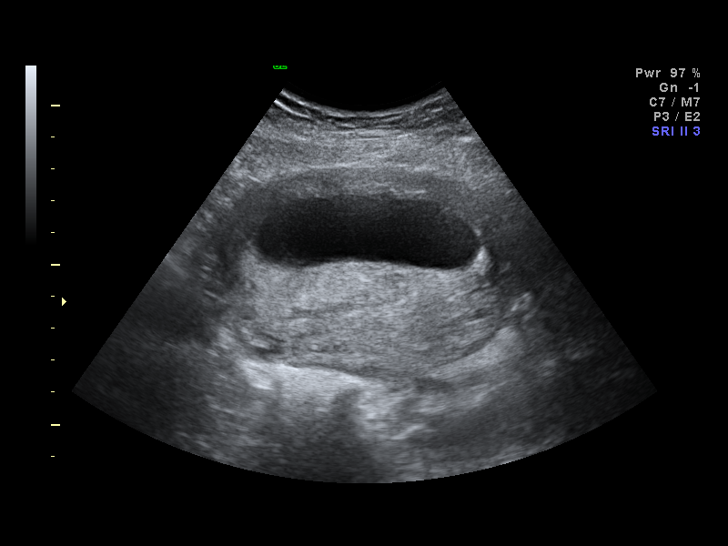
[im 7/38]
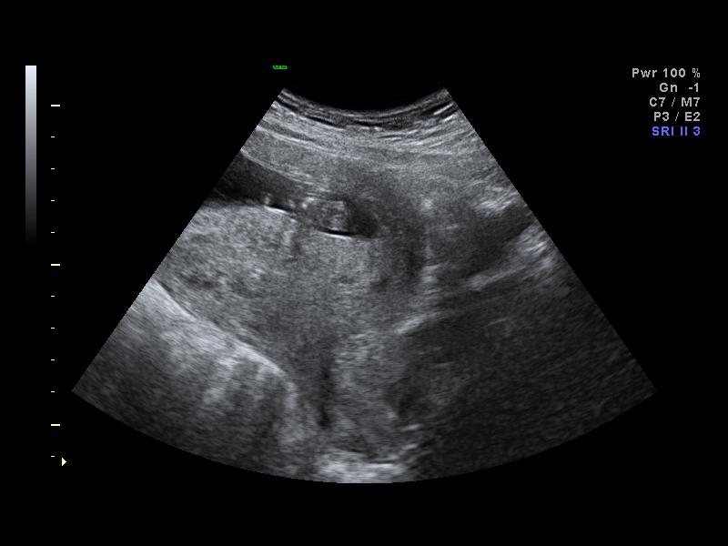
[im 10/38]
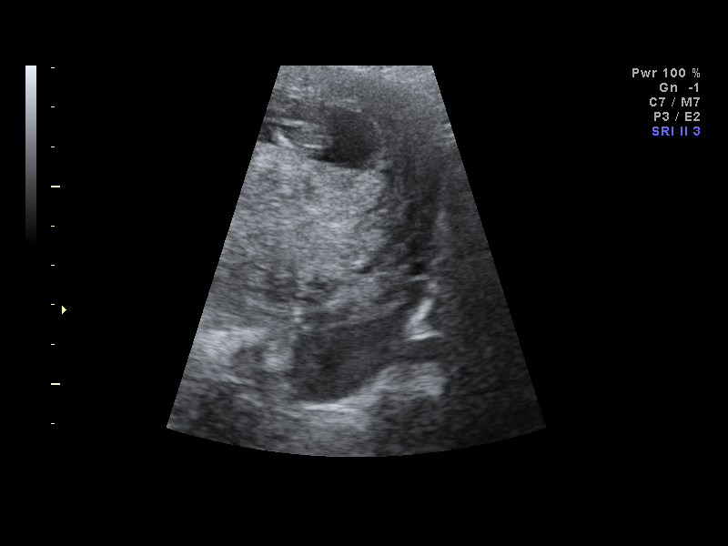
[im 13/38]
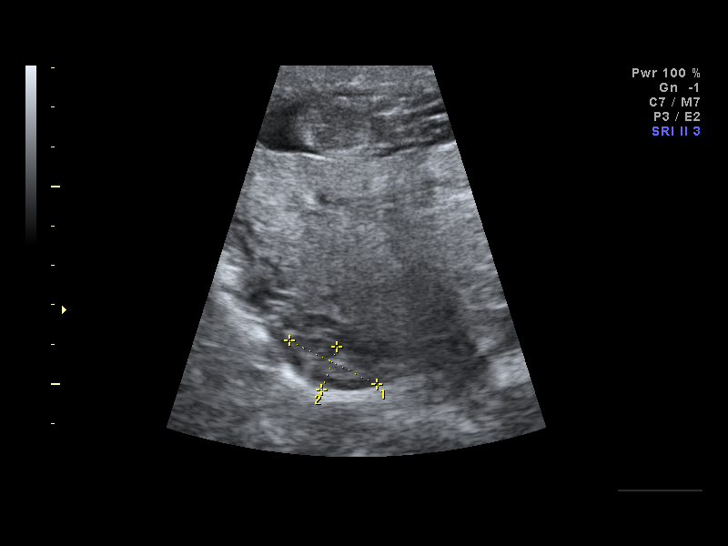
[im 16/38]
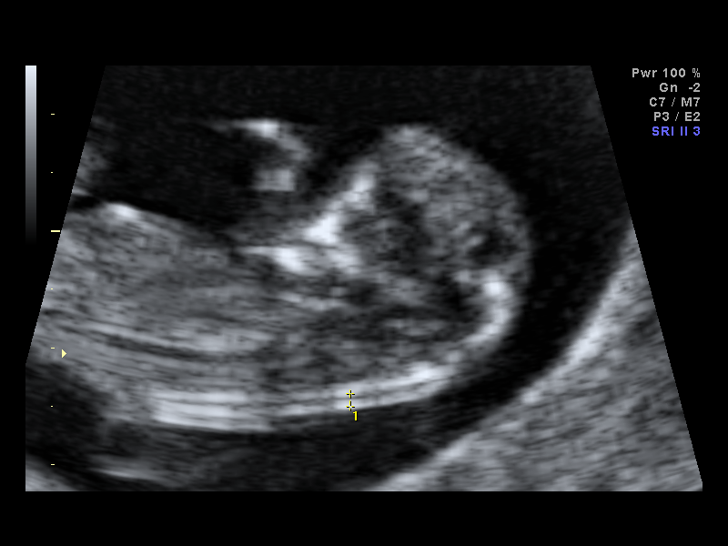
[im 18/38]
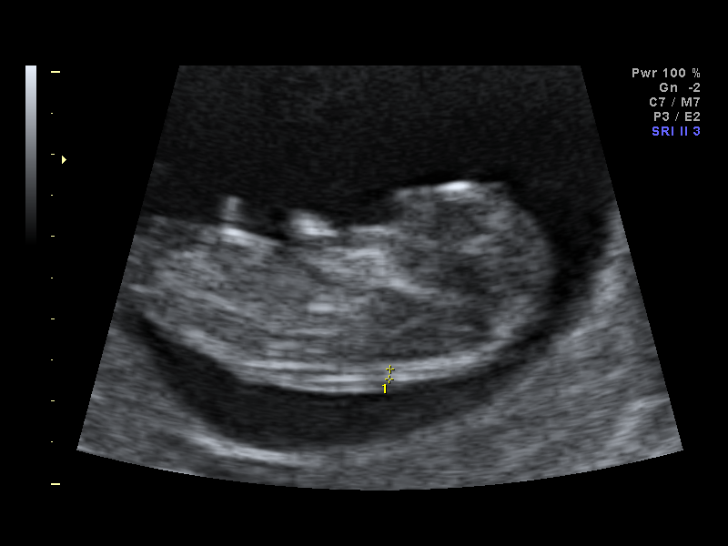
[im 21/38]
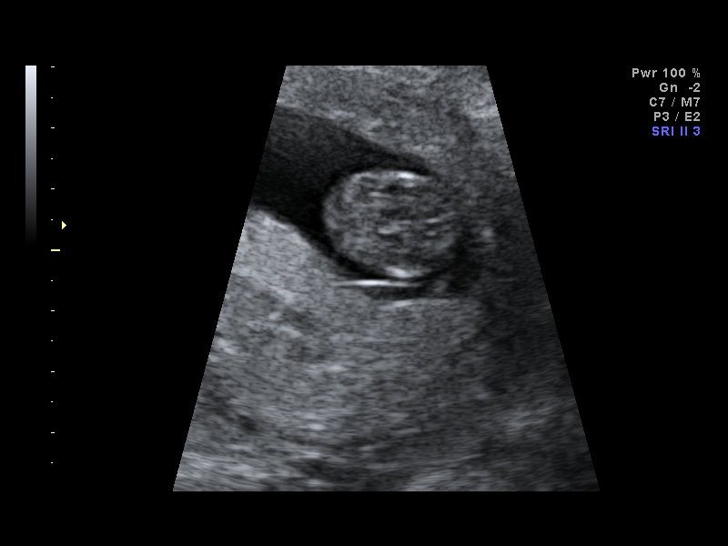
[im 24/38]
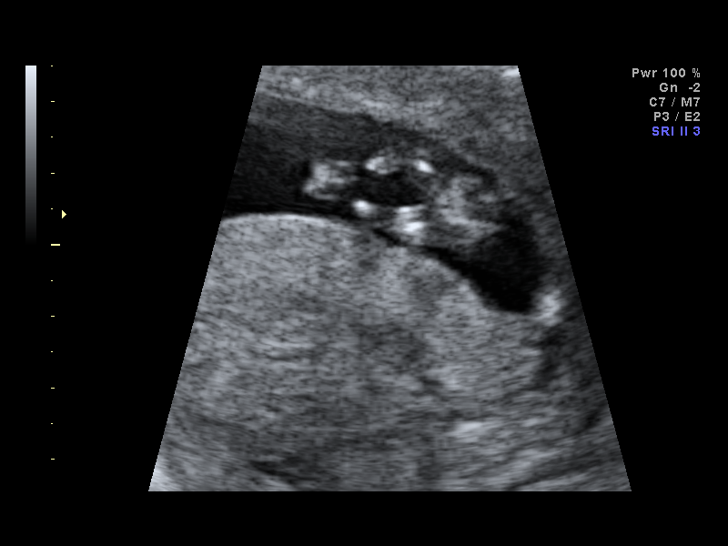
[im 27/38]
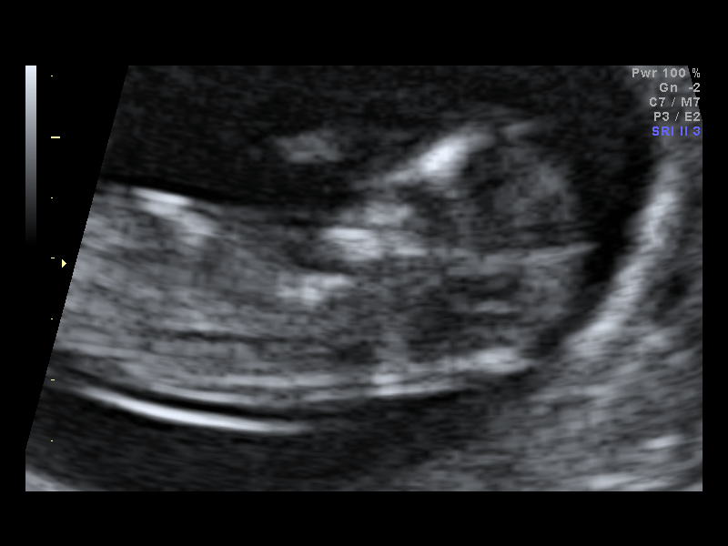
[im 29/38]
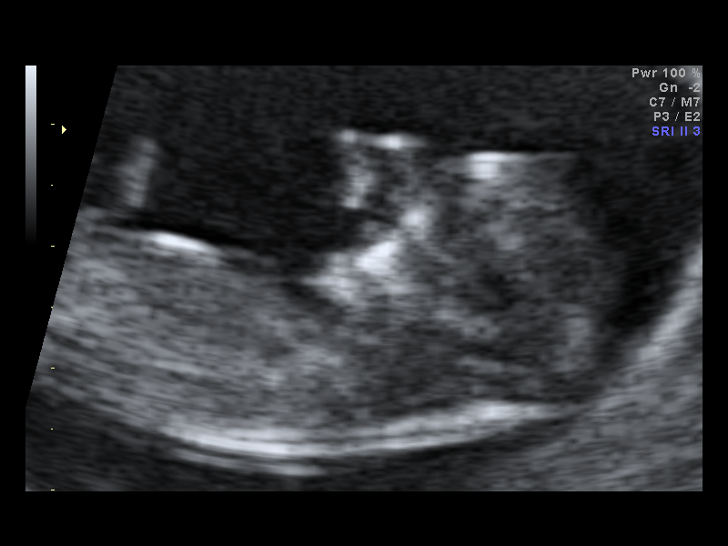
[im 32/38]
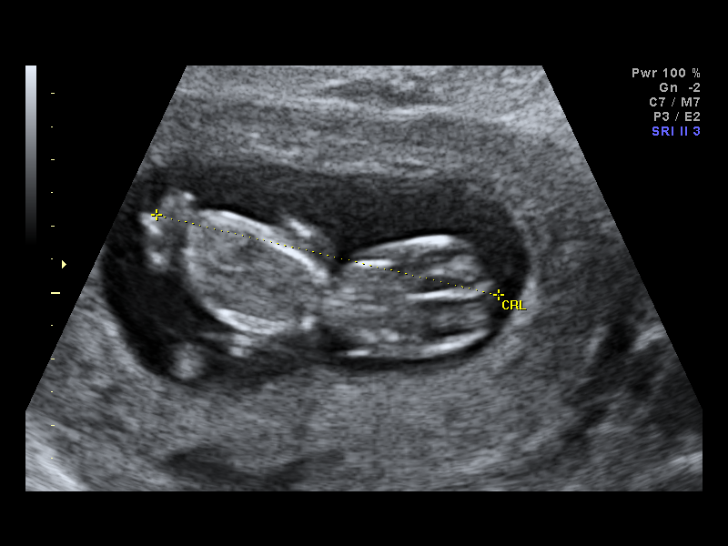
[im 35/38]
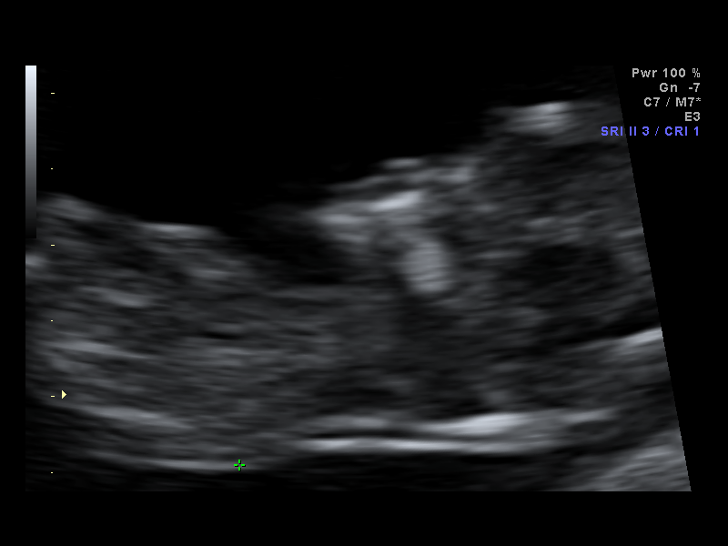
[im 38/38]
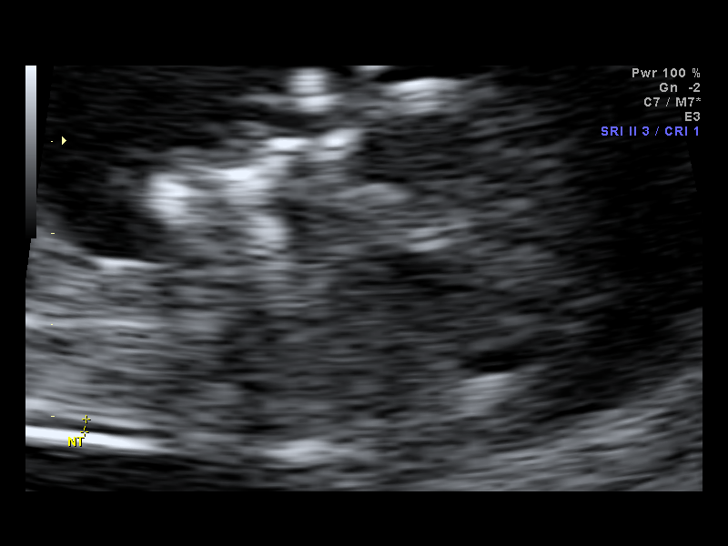

[14 of 28 positions shown; findings below may reference images not displayed]

IMPRESSION: [facility] has also been faxed to the ordering physician.

## 2012-04-13 ENCOUNTER — Encounter: Payer: Self-pay | Admitting: Family Medicine

## 2012-04-13 ENCOUNTER — Ambulatory Visit (INDEPENDENT_AMBULATORY_CARE_PROVIDER_SITE_OTHER): Payer: Medicaid Other | Admitting: Family Medicine

## 2012-04-13 VITALS — BP 121/77 | HR 75 | Temp 98.5°F | Ht 62.5 in | Wt 155.0 lb

## 2012-04-13 DIAGNOSIS — M549 Dorsalgia, unspecified: Secondary | ICD-10-CM

## 2012-04-13 DIAGNOSIS — Z8269 Family history of other diseases of the musculoskeletal system and connective tissue: Secondary | ICD-10-CM

## 2012-04-13 DIAGNOSIS — R071 Chest pain on breathing: Secondary | ICD-10-CM

## 2012-04-13 DIAGNOSIS — S86899A Other injury of other muscle(s) and tendon(s) at lower leg level, unspecified leg, initial encounter: Secondary | ICD-10-CM

## 2012-04-13 DIAGNOSIS — R0781 Pleurodynia: Secondary | ICD-10-CM

## 2012-04-13 DIAGNOSIS — Z840 Family history of diseases of the skin and subcutaneous tissue: Secondary | ICD-10-CM

## 2012-04-13 LAB — COMPREHENSIVE METABOLIC PANEL
ALT: 26 U/L (ref 0–35)
Alkaline Phosphatase: 60 U/L (ref 39–117)
CO2: 24 mEq/L (ref 19–32)
Potassium: 4.1 mEq/L (ref 3.5–5.3)
Sodium: 139 mEq/L (ref 135–145)
Total Bilirubin: 0.3 mg/dL (ref 0.3–1.2)
Total Protein: 5.9 g/dL — ABNORMAL LOW (ref 6.0–8.3)

## 2012-04-13 LAB — POCT URINALYSIS DIPSTICK
Bilirubin, UA: NEGATIVE
Blood, UA: NEGATIVE
Glucose, UA: NEGATIVE
Ketones, UA: NEGATIVE
Spec Grav, UA: 1.01

## 2012-04-13 NOTE — Assessment & Plan Note (Signed)
No current symptoms- not typical for cardiac pain, will work up for Lupus.

## 2012-04-13 NOTE — Assessment & Plan Note (Signed)
Patient's father with early death from Lupus, and she is having a strange conglomerate of symptoms.  Will order ANA, UA (look for protein) ESR, CBC, and CMET.

## 2012-04-13 NOTE — Assessment & Plan Note (Signed)
Advised to continue NSAIDS, hand out given on shin splints- advised icing, rest.

## 2012-04-13 NOTE — Assessment & Plan Note (Signed)
No red flags in exam or history.  Most concerning part is length of symptoms.  Will do testing for Lupus.

## 2012-04-13 NOTE — Patient Instructions (Signed)
It was nice to meet you.  Congratulations on your weight loss and efforts to get healthy- keep it up.  I have ordered some blood work to help determine if your back pain could be from Lupus.  I will call you when I get the results.   Please see the hand out for shin splints- if they do not improve please come back and see me.

## 2012-04-13 NOTE — Progress Notes (Signed)
  Subjective:    Patient ID: Sarah Mcclure, female    DOB: 1985-12-13, 26 y.o.   MRN: [id number]  HPI  Ms. Swarey comes in to establish care.  She has been having trouble with back pain for more than 6 months.  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  They also told her she might have Fibromyalgia.  She says she has lost about 30 lbs since then, because she thought her weight was causing her back pain.  She and her fiance have started exercising and eating healthier.  However, her back has not started feeling better.  She has pain in her upper and mid back, along with some numbness and tingling in her back and hands.  She also has numbness in lower extremities.  She has pain in her lumbar back.  She denies any injuries.    Her father had SLE, and had a heart attack and died at a young age.  She is concerned her symptoms might be from Lupus.  She also says she has had an intermittent rash on her hands and wrists, as well as occasional pain when taking a deep breath.   The patient also complains of shin splints. She says she had them a few years ago, but they went away.  The started again a few months ago.  She is doing low impact exercise, but they still hurt after working out.    Past Medical History  Diagnosis Date  . Asthma     As a child  . Allergy   . Depression   . Chronic back pain greater than 3 months duration 2013   Family History  Problem Relation Age of Onset  . Alcohol abuse Mother   . Arthritis Mother   . Lupus Father   . Alcohol abuse Father   . Heart disease Father   . Depression Father    History  Substance Use Topics  . Smoking status: Never Smoker   . Smokeless tobacco: Never Used  . Alcohol Use: No    Review of Systems Pertinent items in HPI.     Objective:   Physical Exam BP 121/77  Pulse 75  Temp 98.5 F (36.9 C) (Oral)  Ht 5' 2.5" (1.588 m)  Wt 155 lb (70.308 kg)  BMI 27.90 kg/m2  LMP 03/22/2012 General  appearance: alert, cooperative and no distress Head: Normocephalic, without obvious abnormality, atraumatic Lungs: clear to auscultation bilaterally Heart: regular rate and rhythm, S1, S2 normal, no murmur, click, rub or gallop Skin: Skin color, texture, turgor normal. No rashes or lesions Lymph nodes: Cervical, supraclavicular, and axillary nodes normal. Back: FROM, no deformity, normal curvature + TTP at base of neck, bra line, lumbar spine.   Neck: Negative spurling's Full neck range of motion LE: Strength is 5/5 and symmetric, sensation is in tact.        Assessment & Plan:

## 2012-04-14 LAB — CBC WITH DIFFERENTIAL/PLATELET
Basophils Relative: 0 % (ref 0–1)
Eosinophils Absolute: 0.3 10*3/uL (ref 0.0–0.7)
Lymphs Abs: 2.7 10*3/uL (ref 0.7–4.0)
MCH: 30.1 pg (ref 26.0–34.0)
Neutro Abs: 3.8 10*3/uL (ref 1.7–7.7)
Neutrophils Relative %: 52 % (ref 43–77)
Platelets: 278 10*3/uL (ref 150–400)
RBC: 4.32 MIL/uL (ref 3.87–5.11)

## 2012-04-15 ENCOUNTER — Telehealth: Payer: Self-pay | Admitting: Family Medicine

## 2012-04-15 MED ORDER — DICLOFENAC SODIUM 75 MG PO TBEC: 75.0000 mg | DELAYED_RELEASE_TABLET | Freq: Two times a day (BID) | ORAL | Status: DC

## 2012-04-15 NOTE — Telephone Encounter (Signed)
Called patient to let her know- all labs normal (CBC, CMET, ANA, Sed Rate), no signs of autoimmune disease.  Advised to continue exercising as tolerated, do back stretches.  If it does not resolve she can come in for office visit again.  Patient voices understanding.

## 2012-05-25 ENCOUNTER — Ambulatory Visit (INDEPENDENT_AMBULATORY_CARE_PROVIDER_SITE_OTHER): Payer: Medicaid Other | Admitting: Family Medicine

## 2012-05-25 ENCOUNTER — Encounter: Payer: Self-pay | Admitting: Family Medicine

## 2012-05-25 VITALS — BP 118/77 | HR 66 | Temp 98.2°F | Ht 62.5 in | Wt 148.0 lb

## 2012-05-25 DIAGNOSIS — M549 Dorsalgia, unspecified: Secondary | ICD-10-CM

## 2012-05-25 DIAGNOSIS — J3089 Other allergic rhinitis: Secondary | ICD-10-CM

## 2012-05-25 DIAGNOSIS — J3081 Allergic rhinitis due to animal (cat) (dog) hair and dander: Secondary | ICD-10-CM

## 2012-05-25 MED ORDER — FLUTICASONE PROPIONATE 50 MCG/ACT NA SUSP: 2.0000 | Freq: Every day | NASAL | Status: DC

## 2012-05-25 MED ORDER — CETIRIZINE HCL 10 MG PO TABS: 10.0000 mg | ORAL_TABLET | Freq: Every day | ORAL | Status: DC

## 2012-05-25 MED ORDER — TRAMADOL HCL 50 MG PO TABS: 50.0000 mg | ORAL_TABLET | Freq: Three times a day (TID) | ORAL | Status: DC | PRN

## 2012-05-25 NOTE — Progress Notes (Signed)
  Subjective:    Patient ID: Sarah Mcclure, female    DOB: 1986/03/11, 26 y.o.   MRN: [id number]  HPI  Sarah Mcclure comes in to discuss her allergies.  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  She says she sneezes, gets watery eyes, stuffy nose when she works around them.   She says that she thinks overall her back pain is doing OK on the diclofenac and the baclofen.  However, she is under a lot of stress as she is moving and starting a new job.  She says sometimes at the end of her day her back feels like it is on fire.  She says it is her mid and lower back.  No new injuries, no weakness or radicular symptoms.   Past Medical History  Diagnosis Date  . Asthma     As a child  . Allergy   . Depression   . Chronic back pain greater than 3 months duration 2013   Family History  Problem Relation Age of Onset  . Alcohol abuse Mother   . Arthritis Mother   . Lupus Father   . Alcohol abuse Father   . Heart disease Father   . Depression Father    History  Substance Use Topics  . Smoking status: Never Smoker   . Smokeless tobacco: Never Used  . Alcohol Use: No   Review of Systems See HPI    Objective:   Physical Exam BP 118/77  Pulse 66  Temp 98.2 F (36.8 C) (Oral)  Ht 5' 2.5" (1.588 m)  Wt 148 lb (67.132 kg)  BMI 26.64 kg/m2  LMP 05/22/2012 General appearance: alert, cooperative and no distress Eyes: PERRL, EOMIT Ears: normal TM's and external ear canals both ears Nose: Nares normal. Septum midline. Mucosa normal. No drainage or sinus tenderness. Throat: lips, mucosa, and tongue normal; teeth and gums normal Neck: no adenopathy Back: symmetric, no curvature. ROM normal. No CVA tenderness., Mild tenderness to palpation in bilateral thoracic and lumbar areas. Lungs: clear to auscultation  bilaterally Heart: regular rate and rhythm, S1, S2 normal, no murmur, click, rub or gallop       Assessment & Plan:

## 2012-05-25 NOTE — Assessment & Plan Note (Signed)
No red flags or changes- will add tramadol PRN for pain.  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

## 2012-05-25 NOTE — Patient Instructions (Signed)
It was good to see you.  Please try taking the cetirizine and the Flonase every day for your allergies.   For your back pain- try the tramadol as needed.

## 2012-05-25 NOTE — Assessment & Plan Note (Signed)
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

## 2012-07-23 ENCOUNTER — Other Ambulatory Visit: Payer: Self-pay | Admitting: Family Medicine

## 2012-09-15 ENCOUNTER — Other Ambulatory Visit: Payer: Self-pay | Admitting: Family Medicine

## 2012-11-12 ENCOUNTER — Other Ambulatory Visit: Payer: Self-pay | Admitting: Family Medicine

## 2013-01-05 ENCOUNTER — Other Ambulatory Visit: Payer: Self-pay | Admitting: Family Medicine

## 2013-01-24 ENCOUNTER — Other Ambulatory Visit: Payer: Self-pay | Admitting: Family Medicine

## 2013-04-25 ENCOUNTER — Other Ambulatory Visit: Payer: Self-pay | Admitting: Family Medicine

## 2013-06-03 ENCOUNTER — Other Ambulatory Visit: Payer: Self-pay | Admitting: Family Medicine

## 2013-06-07 ENCOUNTER — Other Ambulatory Visit: Payer: Self-pay | Admitting: Family Medicine

## 2013-06-07 NOTE — Telephone Encounter (Signed)
Will forward to MD. Jazmin Hartsell,CMA  

## 2013-06-07 NOTE — Telephone Encounter (Signed)
Would like to see pt in office as has not been seen in >72yr, prior to refill

## 2013-06-07 NOTE — Telephone Encounter (Signed)
Pt states that she still had 2 refills left at pharmacy until FDA changed requirements for tramadol and she was told that she would need a new rx.  Pt states that she no longer has medicaid or any insurance right now.  She would like to know if she can even get a few days worth of medication since she has been out for a week and fibromyalgia is flaring up.  She is fine with making an office visit but would like to know what is the cost of a self pay visit.  Please advise on what she can do in the meantime and I will also send to [place] about visit cost.  [facility]

## 2013-06-07 NOTE — Telephone Encounter (Signed)
Pt called and is asking for a refill on her tramadol. The pharmacy told her with the new laws that are in place you she would have to come get it from [place]. JW

## 2013-06-08 ENCOUNTER — Other Ambulatory Visit: Payer: Self-pay | Admitting: Family Medicine

## 2013-06-08 MED ORDER — TRAMADOL HCL 50 MG PO TABS: ORAL_TABLET | ORAL | Status: DC

## 2013-06-08 NOTE — Telephone Encounter (Signed)
Sarah Mcclure,  Tramadol Rx ready for pickup at front dest for 30tabs.  Needs appt before refill. I agree w/ letting Babs know to see what can be done to assist pt.

## 2013-06-08 NOTE — Telephone Encounter (Signed)
Pt is aware that rx is ready.  Also that she needs to make an appt to get anymore refills.  Pt states that she is working all day and that her husband will pick up her rx for her.  Jazmin Hartsell,CMA

## 2013-06-09 ENCOUNTER — Telehealth: Payer: Self-pay | Admitting: Family Medicine

## 2013-06-09 NOTE — Telephone Encounter (Signed)
Pt called because she found a lump in her breast. She filled out the paperwork for a scholarship at [facility] to have a mammogram done. She was told it was a least a three week waiting period but she wanted you to know and did she have to have a referral for her to have this done? JW

## 2013-06-09 NOTE — Telephone Encounter (Signed)
Please advise - shouldn't pt be seen in office asap for this concern.? Wyatt Haste, RN-BSN

## 2013-06-10 NOTE — Telephone Encounter (Signed)
Yes. Needs to be seen in office. [place] unlikely to be very helpful given her age and density of breast tissue. May need other imaging modality. Have pt come in at earliest convenience

## 2013-06-10 NOTE — Telephone Encounter (Signed)
Called and left message regarding need for appointment -  Wyatt Haste, RN-BSN

## 2013-06-17 ENCOUNTER — Telehealth (HOSPITAL_COMMUNITY): Payer: Self-pay | Admitting: *Deleted

## 2013-06-17 NOTE — Telephone Encounter (Signed)
Telephoned patient at home # and left message to return call to [facility] 

## 2013-06-21 ENCOUNTER — Other Ambulatory Visit: Payer: Self-pay | Admitting: Family Medicine

## 2013-06-23 ENCOUNTER — Ambulatory Visit: Payer: Medicaid Other | Admitting: Family Medicine

## 2013-06-28 ENCOUNTER — Other Ambulatory Visit: Payer: Self-pay | Admitting: *Deleted

## 2013-06-28 DIAGNOSIS — N6320 Unspecified lump in the left breast, unspecified quadrant: Secondary | ICD-10-CM

## 2013-06-29 ENCOUNTER — Ambulatory Visit (INDEPENDENT_AMBULATORY_CARE_PROVIDER_SITE_OTHER): Payer: Medicaid Other | Admitting: Family Medicine

## 2013-06-29 ENCOUNTER — Encounter (HOSPITAL_COMMUNITY): Payer: Self-pay

## 2013-06-29 ENCOUNTER — Ambulatory Visit (HOSPITAL_COMMUNITY)
Admission: RE | Admit: 2013-06-29 | Discharge: 2013-06-29 | Disposition: A | Discharge status: Home / Self Care | Payer: Medicaid Other | Source: Ambulatory Visit | Attending: Obstetrics and Gynecology | Admitting: Obstetrics and Gynecology

## 2013-06-29 ENCOUNTER — Encounter: Payer: Self-pay | Admitting: Family Medicine

## 2013-06-29 ENCOUNTER — Ambulatory Visit: Payer: Medicaid Other | Admitting: Family Medicine

## 2013-06-29 VITALS — BP 120/70 | HR 68 | Ht 61.75 in | Wt 144.0 lb

## 2013-06-29 VITALS — BP 100/62 | Temp 98.7°F | Ht 61.75 in | Wt 147.0 lb

## 2013-06-29 DIAGNOSIS — N6452 Nipple discharge: Secondary | ICD-10-CM

## 2013-06-29 DIAGNOSIS — N6325 Unspecified lump in the left breast, overlapping quadrants: Secondary | ICD-10-CM | POA: Insufficient documentation

## 2013-06-29 DIAGNOSIS — M549 Dorsalgia, unspecified: Secondary | ICD-10-CM

## 2013-06-29 DIAGNOSIS — Z01419 Encounter for gynecological examination (general) (routine) without abnormal findings: Secondary | ICD-10-CM

## 2013-06-29 HISTORY — DX: Fibromyalgia: M79.7

## 2013-06-29 MED ORDER — DICLOFENAC SODIUM 75 MG PO TBEC: 75.0000 mg | DELAYED_RELEASE_TABLET | Freq: Two times a day (BID) | ORAL | Status: DC

## 2013-06-29 NOTE — Addendum Note (Signed)
Encounter addended by: Lurlean Horns, LPN on: 1/61/0960  4:12 PM<BR>     Documentation filed: Visit Diagnoses

## 2013-06-29 NOTE — Addendum Note (Signed)
Encounter addended by: Lurlean Horns, LPN on: 1/61/0960  4:04 PM<BR>     Documentation filed: Visit Diagnoses

## 2013-06-29 NOTE — Patient Instructions (Signed)
Nice to meet you today. We will try to treat your pain with diclofenac. You can take this 2 times a day for the next 10 days and then twice a day as needed after this. If this does not work please let me know and we can try sending in a prescription for the tramadol. If we can not get control of your pain with these methods we may need to consider injections if this ends up being bursitis of your hip.

## 2013-06-29 NOTE — Patient Instructions (Signed)
Taught MELAYA HOSELTON how to perform BSE and gave educational materials to take home. Informed patient that depending on today's Pap smear result will depend on when her next Pap smear is due. Let her know [facility] will cover Pap smears every 3 years unless has a history of abnormal Pap smears. Referred patient to the [facility] for bilateral breast ultrasound. Appointment scheduled for Friday, July 02, 2013 at 1015. Patient aware of appointment and will be there. Let patient know will follow up with her within the next couple weeks with results by phone. Sarah Mcclure verbalized understanding.  Sarah Mcclure, Sarah Maser, RN 2:29 PM

## 2013-06-29 NOTE — Progress Notes (Signed)
Patient ID: Sarah Mcclure, female   DOB: 02/03/1986, 27 y.o.   MRN: [id number] Sarah Mcclure is a 27 y.o. female who presents today for medication refills for back pain.  Back pain: chronic issue. Has previously been seen at [facility] for this issue. Had XR there that per the patient revealed ?vertebral flattening and bursitis.  States pain is like a numbing spasm. Denies incontinence and saddle anesthesia. No radiation of pain. Has been on celebrex in the past but had to stop this due to cost. Is on tramadol now and this is working well, though with change in schedule of this medication she will not be able to afford return appointments for refills every month. Has tried diclofenac in the past, though does not remember how effective this was. Additionally has Rx for methocarbamol for spasms. Also notes noticeable hip pain at times when lying on her sides.  Past Medical History  Diagnosis Date  . Asthma     As a child  . Allergy   . Depression   . Chronic back pain greater than 3 months duration 2013  . Fibromyalgia     History  Smoking status  . Never Smoker   Smokeless tobacco  . Never Used    Family History  Problem Relation Age of Onset  . Alcohol abuse Mother   . Arthritis Mother   . Lupus Father   . Alcohol abuse Father   . Heart disease Father   . Depression Father   . Heart disease Maternal Grandfather     Current Outpatient Prescriptions on File Prior to Visit  Medication Sig Dispense Refill  . cetirizine (ZYRTEC) 10 MG tablet TAKE 1 TABLET EVERY DAY  30 tablet  9  . methocarbamol (ROBAXIN) 500 MG tablet Take 500 mg by mouth 2 (two) times daily.       No current facility-administered medications on file prior to visit.    ROS: Per HPI   Physical Exam Filed Vitals:   06/29/13 1456  BP: 120/70  Pulse: 68    Physical Examination: General appearance - alert, well appearing, and in no distress Neurological - alert, oriented, normal speech, no focal findings  or movement disorder noted, motor and sensory grossly normal bilaterally Musculoskeletal - lower back in paraspinal region tender to palpation bilaterally, no spinous process tenderness, no tenderness over trochanteric bursa Extremities - no pedal edema noted Skin - normal coloration and turgor, no rashes, no suspicious skin lesions noted   Assessment/Plan: Please see individual problem list.

## 2013-06-29 NOTE — Assessment & Plan Note (Signed)
Patient with chronic back pain. Well controlled with current tramadol/methocarbamol regimen, though patient is having cost issues with tramadol and required appointments for refills. Will try diclofenac 75 mg BID for 10 days and then BID prn. If this does not work we will give prescription for tramadol.

## 2013-06-29 NOTE — Progress Notes (Signed)
Complaints of left breast lump x 4 months with pain that comes and goes. Patient rates pain at a 2 out of 10. Patient complained of left breast clear to milky colored discharge when expresses breast.  Pap Smear:    Pap smear completed today. Patients last Pap smear was 11/25/2010 at [facility] and normal. Per patient has a history of an abnormal Pap smear in 2009 that required a colposocopy for follow up. Per patient all Pap smears since her last abnormal Pap smear have been normal. Pap smear result above is in EPIC.  Physical exam: Breasts Breasts symmetrical. No skin abnormalities bilateral breasts. No nipple retraction bilateral breasts. No nipple discharge right breat. Clear nipple discharge left breast when expressed. Sample of discharge sent to cytology. No lymphadenopathy. Palpated moveable lump within left breast at 6 o'clock 2 cm under nipple. Palpated pea sized right breast lump at 3 o'clock 6 cm from the nipple. Patient complained of tenderness when palpated left lower breast lump. Referred patient to the [facility] for bilateral breast ultrasound. Appointment scheduled for Friday, July 02, 2013 at 1015.          Pelvic/Bimanual   Ext Genitalia No lesions, no swelling and no discharge observed on external genitalia.         Vagina Vagina pink and normal texture. No lesions or discharge observed in vagina.          Cervix Cervix is present. Cervix pink and of normal texture. No discharge observed.     Uterus Uterus is present and palpable. Uterus in normal position and normal size.        Adnexae Bilateral ovaries present and palpable. No tenderness on palpation.          Rectovaginal No rectal exam completed today since patient had no rectal complaints. No skin abnormalities observed on exam.

## 2013-06-30 NOTE — Addendum Note (Signed)
Encounter addended by: Lurlean Horns, LPN on: 1/61/0960 10:51 AM<BR>     Documentation filed: Orders

## 2013-07-02 ENCOUNTER — Other Ambulatory Visit: Payer: Medicaid Other

## 2013-07-02 ENCOUNTER — Ambulatory Visit
Admission: RE | Admit: 2013-07-02 | Discharge: 2013-07-02 | Disposition: A | Discharge status: Home / Self Care | Payer: No Typology Code available for payment source | Source: Ambulatory Visit | Attending: Obstetrics and Gynecology | Admitting: Obstetrics and Gynecology

## 2013-07-02 ENCOUNTER — Other Ambulatory Visit: Payer: Self-pay | Admitting: Obstetrics and Gynecology

## 2013-07-02 DIAGNOSIS — N6320 Unspecified lump in the left breast, unspecified quadrant: Secondary | ICD-10-CM

## 2013-07-05 ENCOUNTER — Telehealth (HOSPITAL_COMMUNITY): Payer: Self-pay | Admitting: *Deleted

## 2013-07-05 NOTE — Telephone Encounter (Signed)
Telephoned patient at home # and discussed negative pap smears. Next pap due in 3 years. Patient voiced understanding.

## 2014-01-05 ENCOUNTER — Other Ambulatory Visit: Payer: Self-pay | Admitting: Family Medicine

## 2014-08-15 ENCOUNTER — Encounter: Payer: Self-pay | Admitting: Family Medicine

## 2017-09-10 ENCOUNTER — Encounter (HOSPITAL_COMMUNITY): Payer: Self-pay

## 2017-12-11 ENCOUNTER — Ambulatory Visit (HOSPITAL_COMMUNITY)
Admission: RE | Admit: 2017-12-11 | Discharge: 2017-12-11 | Disposition: A | Discharge status: Home / Self Care | Payer: Medicaid Other | Attending: Psychiatry | Admitting: Psychiatry

## 2017-12-11 ENCOUNTER — Emergency Department (HOSPITAL_COMMUNITY)
Admission: EM | Admit: 2017-12-11 | Discharge: 2017-12-11 | Disposition: A | Discharge status: Home / Self Care | Payer: Medicaid Other | Attending: Emergency Medicine | Admitting: Emergency Medicine

## 2017-12-11 ENCOUNTER — Encounter (HOSPITAL_COMMUNITY): Payer: Self-pay

## 2017-12-11 DIAGNOSIS — F1994 Other psychoactive substance use, unspecified with psychoactive substance-induced mood disorder: Secondary | ICD-10-CM | POA: Diagnosis not present

## 2017-12-11 DIAGNOSIS — F1020 Alcohol dependence, uncomplicated: Secondary | ICD-10-CM | POA: Diagnosis not present

## 2017-12-11 DIAGNOSIS — Z8249 Family history of ischemic heart disease and other diseases of the circulatory system: Secondary | ICD-10-CM | POA: Insufficient documentation

## 2017-12-11 DIAGNOSIS — Z818 Family history of other mental and behavioral disorders: Secondary | ICD-10-CM | POA: Diagnosis not present

## 2017-12-11 DIAGNOSIS — Z880 Allergy status to penicillin: Secondary | ICD-10-CM | POA: Insufficient documentation

## 2017-12-11 DIAGNOSIS — F1120 Opioid dependence, uncomplicated: Secondary | ICD-10-CM | POA: Diagnosis present

## 2017-12-11 DIAGNOSIS — R440 Auditory hallucinations: Secondary | ICD-10-CM | POA: Diagnosis present

## 2017-12-11 DIAGNOSIS — F1220 Cannabis dependence, uncomplicated: Secondary | ICD-10-CM | POA: Diagnosis not present

## 2017-12-11 DIAGNOSIS — F3113 Bipolar disorder, current episode manic without psychotic features, severe: Secondary | ICD-10-CM | POA: Diagnosis not present

## 2017-12-11 DIAGNOSIS — M797 Fibromyalgia: Secondary | ICD-10-CM | POA: Diagnosis not present

## 2017-12-11 DIAGNOSIS — F1920 Other psychoactive substance dependence, uncomplicated: Secondary | ICD-10-CM | POA: Diagnosis not present

## 2017-12-11 DIAGNOSIS — Z8261 Family history of arthritis: Secondary | ICD-10-CM | POA: Insufficient documentation

## 2017-12-11 DIAGNOSIS — Z811 Family history of alcohol abuse and dependence: Secondary | ICD-10-CM | POA: Diagnosis not present

## 2017-12-11 DIAGNOSIS — Z8269 Family history of other diseases of the musculoskeletal system and connective tissue: Secondary | ICD-10-CM | POA: Diagnosis not present

## 2017-12-11 DIAGNOSIS — Z79899 Other long term (current) drug therapy: Secondary | ICD-10-CM | POA: Diagnosis not present

## 2017-12-11 LAB — COMPREHENSIVE METABOLIC PANEL
ALBUMIN: 4.2 g/dL (ref 3.5–5.0)
ALK PHOS: 66 U/L (ref 38–126)
ALT: 21 U/L (ref 14–54)
AST: 20 U/L (ref 15–41)
Anion gap: 9 (ref 5–15)
BUN: 8 mg/dL (ref 6–20)
CALCIUM: 9.2 mg/dL (ref 8.9–10.3)
CHLORIDE: 105 mmol/L (ref 101–111)
CO2: 23 mmol/L (ref 22–32)
Creatinine, Ser: 0.7 mg/dL (ref 0.44–1.00)
GFR calc Af Amer: >60 mL/min (ref >60)
GFR calc non Af Amer: >60 mL/min (ref >60)
GLUCOSE: 120 mg/dL — AB (ref 65–99)
POTASSIUM: 3 mmol/L — AB (ref 3.5–5.1)
SODIUM: 137 mmol/L (ref 135–145)
Total Bilirubin: 0.7 mg/dL (ref 0.3–1.2)
Total Protein: 6.9 g/dL (ref 6.5–8.1)

## 2017-12-11 LAB — RAPID URINE DRUG SCREEN, HOSP PERFORMED
AMPHETAMINES: POSITIVE — AB
BENZODIAZEPINES: POSITIVE — AB
Barbiturates: NOT DETECTED
COCAINE: NOT DETECTED
OPIATES: POSITIVE — AB
TETRAHYDROCANNABINOL: POSITIVE — AB

## 2017-12-11 LAB — I-STAT BETA HCG BLOOD, ED (MC, WL, AP ONLY): I-stat hCG, quantitative: <5 m[IU]/mL (ref <5)

## 2017-12-11 LAB — CBC
HEMATOCRIT: 36.9 % (ref 36.0–46.0)
HEMOGLOBIN: 12.8 g/dL (ref 12.0–15.0)
MCH: 30.5 pg (ref 26.0–34.0)
MCHC: 34.7 g/dL (ref 30.0–36.0)
MCV: 87.9 fL (ref 78.0–100.0)
Platelets: 338 10*3/uL (ref 150–400)
RBC: 4.2 MIL/uL (ref 3.87–5.11)
RDW: 12.3 % (ref 11.5–15.5)
WBC: 11.1 10*3/uL — AB (ref 4.0–10.5)

## 2017-12-11 LAB — ACETAMINOPHEN LEVEL

## 2017-12-11 LAB — SALICYLATE LEVEL: Salicylate Lvl: <7 mg/dL (ref 2.8–30.0)

## 2017-12-11 LAB — ETHANOL: Alcohol, Ethyl (B): <10 mg/dL (ref <10)

## 2017-12-11 MED ORDER — ONDANSETRON 4 MG PO TBDP: 4.0000 mg | ORAL_TABLET | Freq: Four times a day (QID) | ORAL | Status: DC | PRN

## 2017-12-11 MED ORDER — CLONIDINE HCL 0.1 MG PO TABS: 0.1000 mg | ORAL_TABLET | ORAL | Status: DC

## 2017-12-11 MED ORDER — NICOTINE 21 MG/24HR TD PT24
21.0000 mg | MEDICATED_PATCH | Freq: Every day | TRANSDERMAL | Status: DC
  Administered 2017-12-11 (×2): 21 mg via TRANSDERMAL
  Filled 2017-12-11 (×2): qty 1

## 2017-12-11 MED ORDER — NAPROXEN 500 MG PO TABS
500.0000 mg | ORAL_TABLET | Freq: Two times a day (BID) | ORAL | Status: DC | PRN
  Administered 2017-12-11: 500 mg via ORAL
  Filled 2017-12-11: qty 1

## 2017-12-11 MED ORDER — LORAZEPAM 1 MG PO TABS
1.0000 mg | ORAL_TABLET | Freq: Once | ORAL | Status: AC
  Administered 2017-12-11: 1 mg via ORAL
  Filled 2017-12-11: qty 1

## 2017-12-11 MED ORDER — POTASSIUM CHLORIDE CRYS ER 20 MEQ PO TBCR
40.0000 meq | EXTENDED_RELEASE_TABLET | Freq: Once | ORAL | Status: AC
  Administered 2017-12-11: 40 meq via ORAL
  Filled 2017-12-11: qty 2

## 2017-12-11 MED ORDER — METHOCARBAMOL 500 MG PO TABS
500.0000 mg | ORAL_TABLET | Freq: Three times a day (TID) | ORAL | Status: DC | PRN
  Administered 2017-12-11: 500 mg via ORAL
  Filled 2017-12-11: qty 1

## 2017-12-11 MED ORDER — ACETAMINOPHEN 325 MG PO TABS
650.0000 mg | ORAL_TABLET | ORAL | Status: DC | PRN
  Administered 2017-12-11: 650 mg via ORAL
  Filled 2017-12-11: qty 2

## 2017-12-11 MED ORDER — DICYCLOMINE HCL 20 MG PO TABS: 20.0000 mg | ORAL_TABLET | Freq: Four times a day (QID) | ORAL | Status: DC | PRN

## 2017-12-11 MED ORDER — CLONIDINE HCL 0.1 MG PO TABS: 0.1000 mg | ORAL_TABLET | Freq: Every day | ORAL | Status: DC

## 2017-12-11 MED ORDER — LOPERAMIDE HCL 2 MG PO CAPS: 2.0000 mg | ORAL_CAPSULE | ORAL | Status: DC | PRN

## 2017-12-11 MED ORDER — HYDROXYZINE HCL 25 MG PO TABS
25.0000 mg | ORAL_TABLET | Freq: Four times a day (QID) | ORAL | Status: DC | PRN
  Administered 2017-12-11: 25 mg via ORAL
  Filled 2017-12-11: qty 1

## 2017-12-11 MED ORDER — CLONIDINE HCL 0.1 MG PO TABS
0.1000 mg | ORAL_TABLET | Freq: Four times a day (QID) | ORAL | Status: DC
  Administered 2017-12-11: 0.1 mg via ORAL
  Filled 2017-12-11 (×2): qty 1
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
## 2017-12-11 NOTE — ED Notes (Signed)
Pt taking a shower 

## 2017-12-11 NOTE — ED Provider Notes (Signed)
[facility]-EMERGENCY DEPT Provider Note   CSN: [id number] Arrival date & time: 12/11/17  0209     History   Chief Complaint Chief Complaint  Patient presents with  . Medical Clearance    HPI Sarah Mcclure is a 32 y.o. female.  Patient presents with concern for symptoms of mania, including decreased sleep, unable to concentrate, auditory hallucinations. No SI/HI. She admits to infrequent drug use. She is increasingly paranoid, asking her significant other if she is seeing someone else. She feels under a significant amount of stress and cannot tolerate her current situation. She was on Lamictal in the past but tapered herself off the medication and is no longer on anything. She cannot remember how long she has gone without medication.    The history is provided by the patient. No language interpreter was used.    Past Medical History:  Diagnosis Date  . Allergy   . Asthma    As a child  . Chronic back pain greater than 3 months duration 2013  . Depression   . Fibromyalgia     Patient Active Problem List   Diagnosis Date Noted  . Breast lump on left side at 6 o'clock position 06/29/2013  . Breast lump on right side at 3 o'clock position 06/29/2013  . Cat allergies 05/25/2012  . Family history of systemic lupus erythematosus 04/13/2012  . Back pain 04/13/2012  . Pleuritic chest pain 04/13/2012  . Shin splints 04/13/2012    History reviewed. No pertinent surgical history.  OB History    Gravida Para Term Preterm AB Living   5 2 2   3 2    SAB TAB Ectopic Multiple Live Births   2 1             Home Medications    Prior to Admission medications   Medication Sig Start Date End Date Taking? Authorizing Provider  carisoprodol (SOMA) 350 MG tablet Take 350 mg by mouth 3 (three) times daily. 11/30/17  Yes [provider]  ibuprofen (ADVIL,MOTRIN) 200 MG tablet Take 600 mg by mouth every 6 (six) hours as needed for moderate pain.   Yes  [provider]  LamoTRIgine (LAMICTAL PO) Take by mouth as directed.   Yes [provider]  cetirizine (ZYRTEC) 10 MG tablet TAKE 1 TABLET EVERY DAY Patient not taking: Reported on 12/11/2017 06/21/13   Ozella Rocks, MD  diclofenac (VOLTAREN) 75 MG EC tablet TAKE ONE TABLET BY MOUTH TWICE DAILY FOR 10 DAYS AND THEN CAN TAKE TWICE DAILY AS NEEDED Patient not taking: Reported on 12/11/2017    Ozella Rocks, MD    Family History Family History  Problem Relation Age of Onset  . Alcohol abuse Mother   . Arthritis Mother   . Lupus Father   . Alcohol abuse Father   . Heart disease Father   . Depression Father   . Heart disease Maternal Grandfather     Social History Social History   Tobacco Use  . Smoking status: Never Smoker  . Smokeless tobacco: Never Used  Substance Use Topics  . Alcohol use: No  . Drug use: No     Allergies   Amoxicillin and Penicillins   Review of Systems Review of Systems  Constitutional: Negative for chills and fever.  HENT: Negative.   Respiratory: Negative.   Cardiovascular: Negative.   Gastrointestinal: Negative.   Musculoskeletal: Negative.   Skin: Negative.   Neurological: Negative.   Psychiatric/Behavioral: Positive for agitation,  decreased concentration, hallucinations and sleep disturbance. Negative for suicidal ideas. The patient is nervous/anxious and is hyperactive.      Physical Exam Updated Vital Signs BP 111/71 (BP Location: Left Arm)   Pulse 72   Temp 98 F (36.7 C) (Oral)   Resp 12   LMP 11/19/2017   SpO2 100%   Physical Exam  Constitutional: She is oriented to person, place, and time. She appears well-developed and well-nourished.  HENT:  Head: Normocephalic.  Neck: Normal range of motion. Neck supple.  Cardiovascular: Normal rate and regular rhythm.  Pulmonary/Chest: Effort normal and breath sounds normal. She has no wheezes. She has no rales.  Abdominal: Soft. Bowel sounds are normal. There is  no tenderness. There is no rebound and no guarding.  Musculoskeletal: Normal range of motion.  Neurological: She is alert and oriented to person, place, and time.  Skin: Skin is warm and dry. No rash noted.  Psychiatric: She has a normal mood and affect.     ED Treatments / Results  Labs (all labs ordered are listed, but only abnormal results are displayed) Labs Reviewed  COMPREHENSIVE METABOLIC PANEL - Abnormal; Notable for the following components:      Result Value   Potassium 3.0 (*)    Glucose, Bld 120 (*)    All other components within normal limits  ACETAMINOPHEN LEVEL - Abnormal; Notable for the following components:   Acetaminophen (Tylenol), Serum <10 (*)    All other components within normal limits  CBC - Abnormal; Notable for the following components:   WBC 11.1 (*)    All other components within normal limits  RAPID URINE DRUG SCREEN, HOSP PERFORMED - Abnormal; Notable for the following components:   Opiates POSITIVE (*)    Benzodiazepines POSITIVE (*)    Amphetamines POSITIVE (*)    Tetrahydrocannabinol POSITIVE (*)    All other components within normal limits  ETHANOL  SALICYLATE LEVEL  I-STAT BETA HCG BLOOD, ED ([facility], [facility], [facility] ONLY)  I-STAT BETA HCG BLOOD, ED ([facility], [facility], [facility] ONLY)    EKG  EKG Interpretation None       Radiology No results found.  Procedures Procedures (including critical care time)  Medications Ordered in ED Medications  nicotine (NICODERM CQ - dosed in mg/24 hours) patch 21 mg (21 mg Transdermal Patch Applied 12/11/17 0420)  acetaminophen (TYLENOL) tablet 650 mg (650 mg Oral Given 12/11/17 0359)  LORazepam (ATIVAN) tablet 1 mg (not administered)     Initial Impression / Assessment and Plan / ED Course  I have reviewed the triage vital signs and the nursing notes.  Pertinent labs & imaging results that were available during my care of the patient were reviewed by me and considered in my medical decision making (see chart for  details).     Patient is here for hyperactivity, paranoia, auditory hallucinations, lack of sleep. No SI/HI. She will need to be evaluated by TTS to help determine appropriate disposition.   She has a mildly low potassium that will be supplemented here. Ativan provided for agitation. She is offered a nicotine patch for as needed use.   Final Clinical Impressions(s) / ED Diagnoses   Final diagnoses:  None   1. Mania  ED Discharge Orders    None       Elpidio AnisUpstill, Saia Derossett, Cordelia Poche-C 12/12/17 [id number]    Shaune PollackIsaacs, Cameron, MD 12/12/17 1106

## 2017-12-11 NOTE — ED Notes (Signed)
Peer support with pt.

## 2017-12-11 NOTE — Discharge Instructions (Signed)
To help you maintain a sober lifestyle, a substance abuse treatment program may be beneficial to you.  Contact one of the following facilities at your earliest opportunity to ask about enrolling: ° °RESIDENTIAL PROGRAMS: ° °     [facility] °     [address] °     [place], [place] [address] °     [phone] ° °     [facility] °     [address] °     [place], [place] [address] °     [phone] ° °     [facility] °     [address] °     [place], [place] [address] °     [phone] ° °OUTPATIENT PROGRAMS: ° °     [facility] °     [address] °     [place], [place] [address] °     [phone] °     New patients are seen at the walk-in clinic every Tuesday from 9:00 am - 12:00 pm °

## 2017-12-11 NOTE — ED Notes (Signed)
Pt d/c home per MD order. Discharge summary reviewed with pt. Pt verbalizes understanding. Pt denies SI/HI/AVH. Personal property returned to pt. Pt signed e-signature. Ambulatory off unit.  

## 2017-12-11 NOTE — ED Triage Notes (Signed)
Pt is here voluntarily with significant other Pt is not answering questions coherently and questioning her girlfriend about being with someone else Pt's girlfriend says that she's been off her meds and under a lot of stress

## 2017-12-11 NOTE — H&P (Signed)
Behavioral Health Medical Screening Exam  Sarah Mcclure is an 32 y.o. female who presents with her (partner) with reported x one week duration of marked mania, to include insomnia, declined executive fxns, mood dysregulation and AVH. She has a hx of bipolar d/o and weaned herself off of Lamictal a month ago. She has been self medicating with cannabis, Xanax and undisclosed recreational drugs. She is denying any physical ailments and or exacerbated comorbid conditions.  Total Time spent with patient: 20 minutes  Psychiatric Specialty Exam: Physical Exam  Constitutional: She is oriented to person, place, and time. She appears well-developed and well-nourished. No distress.  HENT:  Head: Normocephalic.  Eyes: Pupils are equal, round, and reactive to light.  Respiratory: Effort normal and breath sounds normal. No respiratory distress.  Neurological: She is alert and oriented to person, place, and time. No cranial nerve deficit.  Skin: Skin is warm and dry. She is not diaphoretic.  Psychiatric: Her mood appears anxious. Her affect is inappropriate. Her speech is rapid and/or pressured. She is agitated and hyperactive. Cognition and memory are impaired. She expresses impulsivity. She exhibits a depressed mood. She expresses no homicidal and no suicidal ideation. She expresses no suicidal plans and no homicidal plans.    Review of Systems  Constitutional: Negative for chills, diaphoresis, fever, malaise/fatigue and weight loss.  Respiratory: Negative for cough and shortness of breath.   Cardiovascular: Negative for chest pain.  Neurological: Negative for weakness.  Psychiatric/Behavioral: Positive for hallucinations and substance abuse. The patient is nervous/anxious and has insomnia.     Blood pressure 111/71, pulse 72, temperature 98 F (36.7 C), temperature source Oral, resp. rate 12, last menstrual period 11/19/2017, SpO2 100 %.There is no height or weight on file to calculate BMI.  General  Appearance: Disheveled  Eye Contact:  Fair  Speech:  Pressured  Volume:  Normal  Mood:  Anxious  Affect:  Congruent  Thought Process:  Disorganized  Orientation:  Full (Time, Place, and Person)  Thought Content:  Hallucinations: Auditory  Suicidal Thoughts:  No  Homicidal Thoughts:  No  Memory:  Immediate;   Poor  Judgement:  Impaired  Insight:  Lacking  Psychomotor Activity:  Restlessness  Concentration: Concentration: Poor  Recall:  Poor  Fund of Knowledge:Poor  Language: Fair  Akathisia:  Negative  Handed:  Right  AIMS (if indicated):     Assets:  Desire for Improvement  Sleep:       Musculoskeletal: Strength & Muscle Tone: within normal limits Gait & Station: normal Patient leans: N/A  Blood pressure 111/71, pulse 72, temperature 98 F (36.7 C), temperature source Oral, resp. rate 12, last menstrual period 11/19/2017, SpO2 100 %.  Recommendations:  Based on my evaluation the patient appears to have an emergency medical condition for which I recommend the patient be transferred to the emergency department for further evaluation.  Kerry HoughSpencer E Roni Scow, PA-C 12/11/2017, 4:43 AM

## 2017-12-11 NOTE — Patient Outreach (Signed)
ED Peer Support Specialist Patient Intake (Complete at intake & 30-60 Day Follow-up)  Name: Sarah Mcclure  MRN: [id number]  Age: 32 y.o.   Date of Admission: 12/11/2017  Intake: Initial Comments:      Primary Reason Admitted: The pt came in with her fiance Alain Marion [phone].  According to the the fiance, the pt came off of her lamictal about a month ago and has been manic for the past 5 days.  The pt has been sleeping about an hour a day and has "conspitracy theories" about many different things.  The pt also appears to be paranoid and thinking her fiance and other people are doing things to her.  The pt stated that she stopped the lamictal, because she was depressed while on the lamictal and now she has her "passion" back.  The pt was on lamictal for the past 3 years.  The pt described her main stressors as arguing with her fiance and not having reliable transportation.    Lab values: Alcohol/ETOH: Positive Positive UDS? Yes Amphetamines: Yes Barbiturates: No Benzodiazepines: Yes Cocaine: No Opiates: Yes Cannabinoids: Yes  Demographic information: Gender: Female Ethnicity: White Marital Status: Single Insurance Status: Uninsured/Self-pay Ecologist (Work Neurosurgeon, Physicist, medical, etc.: Yes(Food stamps ) Lives with: Friend/Rommate Living situation: House/Apartment  Reported Patient History: Patient reported health conditions: Anxiety disorders, Bipolar disorder, Depression Patient aware of HIV and hepatitis status: No  In past year, has patient visited ED for any reason? No  Number of ED visits:    Reason(s) for visit:    In past year, has patient been hospitalized for any reason? No  Number of hospitalizations:    Reason(s) for hospitalization:    In past year, has patient been arrested? No  Number of arrests:    Reason(s) for arrest:    In past year, has patient been incarcerated? No  Number of incarcerations:     Reason(s) for incarceration:    In past year, has patient received medication-assisted treatment? Yes, [facility] ([facility]  In past year, patient received the following treatments: Residential treatment (non-hospital)  In past year, has patient received any harm reduction services? Yes  Did this include any of the following? Other (comment)  In past year, has patient received care from a mental health provider for diagnosis other than SUD? Yes([facility])  In past year, is this first time patient has overdosed? No  Number of past overdoses:    In past year, is this first time patient has been hospitalized for an overdose? No  Number of hospitalizations for overdose(s):    Is patient currently receiving treatment for a mental health diagnosis? Yes(Stated Yes with this visit )  Patient reports experiencing difficulty participating in SUD treatment: No    Most important reason(s) for this difficulty?    Has patient received prior services for treatment? Yes  In past, patient has received services from following agencies: Other (comment)  Plan of Care:  Suggested follow up at these agencies/treatment centers: Other (comment)([facility] )  Other information: CPSS met with Pt and was able to complete the assessment and complete motivation interviewing to process with Pt. CPSS asked Pt if she wants to look into receiving treatment. CPSS discussed the importance of Pt wanting to work on change her lifestyle an work on a positive Publishing rights manager. CPSS made Pt aware that [place] an Aaron Edelman will be willing to work with Pt in the community to better the quality of her life.  CPSS John gave Pt information about the [facility] program that will help her with her substance abuse issues.    Aaron Edelman Payton Moder, CPSS  12/11/2017 11:35 AM

## 2017-12-11 NOTE — ED Notes (Signed)
Patient admitted on unit. Pacing and appear restless. Complained of generalized body pain. Patient stated "I can't explain how I feel but I know that I don't feel good. I feel horrible". Patient tearful and continue to ask to see her "girl friend". Patient appear paranoid. Denies SI/HI, AH/VH at this time. Speech tangential and incoherent at times.  Karleen HampshireSpencer NP notified - orders placed. Patient accepted. In bed at this time. Will continue to monitor patient.

## 2017-12-11 NOTE — ED Notes (Signed)
Bed: [id number] Expected date:  Expected time:  Means of arrival:  Comments: WTR5

## 2017-12-11 NOTE — BH Assessment (Signed)
[facility] Assessment Progress Note  Per Jacqueline Norman, DO, this pt does not require psychiatric hospitalization at this time.  Pt is to be discharged from [facility] with referral information for area substance abuse treatment providers.  This has been included in pt's discharge instructions.  Pt would also benefit from seeing Peer Support Specialists; they will be asked to speak to pt.  Pt's nurse, Ashley, has been notified.  Sarah Krass, MA Triage Specialist [phone]     

## 2017-12-12 ENCOUNTER — Encounter (HOSPITAL_COMMUNITY): Payer: Self-pay

## 2017-12-12 ENCOUNTER — Emergency Department (HOSPITAL_COMMUNITY)
Admission: EM | Admit: 2017-12-12 | Discharge: 2017-12-15 | Disposition: A | Discharge status: Other Acute Inpatient Hospital | Payer: Medicaid Other | Attending: Emergency Medicine | Admitting: Emergency Medicine

## 2017-12-12 ENCOUNTER — Emergency Department (HOSPITAL_COMMUNITY): Payer: Medicaid Other

## 2017-12-12 ENCOUNTER — Other Ambulatory Visit: Payer: Self-pay

## 2017-12-12 DIAGNOSIS — R0789 Other chest pain: Secondary | ICD-10-CM | POA: Insufficient documentation

## 2017-12-12 DIAGNOSIS — R1011 Right upper quadrant pain: Secondary | ICD-10-CM | POA: Diagnosis not present

## 2017-12-12 DIAGNOSIS — R451 Restlessness and agitation: Secondary | ICD-10-CM | POA: Diagnosis not present

## 2017-12-12 DIAGNOSIS — J45909 Unspecified asthma, uncomplicated: Secondary | ICD-10-CM | POA: Insufficient documentation

## 2017-12-12 DIAGNOSIS — Z79899 Other long term (current) drug therapy: Secondary | ICD-10-CM | POA: Insufficient documentation

## 2017-12-12 DIAGNOSIS — Z046 Encounter for general psychiatric examination, requested by authority: Secondary | ICD-10-CM | POA: Diagnosis not present

## 2017-12-12 DIAGNOSIS — R440 Auditory hallucinations: Secondary | ICD-10-CM | POA: Insufficient documentation

## 2017-12-12 DIAGNOSIS — F1920 Other psychoactive substance dependence, uncomplicated: Secondary | ICD-10-CM | POA: Diagnosis not present

## 2017-12-12 DIAGNOSIS — F1120 Opioid dependence, uncomplicated: Secondary | ICD-10-CM | POA: Insufficient documentation

## 2017-12-12 DIAGNOSIS — F419 Anxiety disorder, unspecified: Secondary | ICD-10-CM | POA: Insufficient documentation

## 2017-12-12 DIAGNOSIS — R112 Nausea with vomiting, unspecified: Secondary | ICD-10-CM | POA: Insufficient documentation

## 2017-12-12 DIAGNOSIS — J1100 Influenza due to unidentified influenza virus with unspecified type of pneumonia: Secondary | ICD-10-CM

## 2017-12-12 DIAGNOSIS — Z818 Family history of other mental and behavioral disorders: Secondary | ICD-10-CM | POA: Diagnosis not present

## 2017-12-12 DIAGNOSIS — R45851 Suicidal ideations: Secondary | ICD-10-CM | POA: Insufficient documentation

## 2017-12-12 DIAGNOSIS — Z9114 Patient's other noncompliance with medication regimen: Secondary | ICD-10-CM | POA: Diagnosis not present

## 2017-12-12 DIAGNOSIS — Z811 Family history of alcohol abuse and dependence: Secondary | ICD-10-CM | POA: Diagnosis not present

## 2017-12-12 LAB — COMPREHENSIVE METABOLIC PANEL
ALT: 18 U/L (ref 14–54)
AST: 17 U/L (ref 15–41)
Albumin: 4.2 g/dL (ref 3.5–5.0)
Alkaline Phosphatase: 63 U/L (ref 38–126)
Anion gap: 8 (ref 5–15)
BILIRUBIN TOTAL: 0.7 mg/dL (ref 0.3–1.2)
BUN: 10 mg/dL (ref 6–20)
CO2: 25 mmol/L (ref 22–32)
Calcium: 9.4 mg/dL (ref 8.9–10.3)
Chloride: 107 mmol/L (ref 101–111)
Creatinine, Ser: 0.68 mg/dL (ref 0.44–1.00)
GFR calc Af Amer: >60 mL/min (ref >60)
Glucose, Bld: 91 mg/dL (ref 65–99)
POTASSIUM: 3.5 mmol/L (ref 3.5–5.1)
Sodium: 140 mmol/L (ref 135–145)
TOTAL PROTEIN: 6.7 g/dL (ref 6.5–8.1)

## 2017-12-12 LAB — I-STAT BETA HCG BLOOD, ED (MC, WL, AP ONLY): I-stat hCG, quantitative: <5 m[IU]/mL (ref <5)

## 2017-12-12 LAB — CBC
HCT: 37 % (ref 36.0–46.0)
Hemoglobin: 13 g/dL (ref 12.0–15.0)
MCH: 30.3 pg (ref 26.0–34.0)
MCHC: 35.1 g/dL (ref 30.0–36.0)
MCV: 86.2 fL (ref 78.0–100.0)
PLATELETS: 315 10*3/uL (ref 150–400)
RBC: 4.29 MIL/uL (ref 3.87–5.11)
RDW: 12.3 % (ref 11.5–15.5)
WBC: 13.9 10*3/uL — AB (ref 4.0–10.5)

## 2017-12-12 LAB — I-STAT TROPONIN, ED: Troponin i, poc: 0 ng/mL (ref 0.00–0.08)

## 2017-12-12 LAB — ETHANOL

## 2017-12-12 LAB — ACETAMINOPHEN LEVEL: Acetaminophen (Tylenol), Serum: <10 ug/mL — ABNORMAL LOW (ref 10–30)

## 2017-12-12 LAB — SALICYLATE LEVEL: Salicylate Lvl: <7 mg/dL (ref 2.8–30.0)

## 2017-12-12 MED ORDER — ONDANSETRON HCL 4 MG PO TABS
4.0000 mg | ORAL_TABLET | Freq: Three times a day (TID) | ORAL | Status: DC | PRN
  Administered 2017-12-13 – 2017-12-15 (×4): 4 mg via ORAL
  Filled 2017-12-12 (×4): qty 1

## 2017-12-12 MED ORDER — OLANZAPINE 5 MG PO TBDP: 5.0000 mg | ORAL_TABLET | Freq: Every day | ORAL | Status: DC

## 2017-12-12 MED ORDER — ALUM & MAG HYDROXIDE-SIMETH 200-200-20 MG/5ML PO SUSP: 30.0000 mL | Freq: Four times a day (QID) | ORAL | Status: DC | PRN

## 2017-12-12 MED ORDER — ACETAMINOPHEN 325 MG PO TABS: 650.0000 mg | ORAL_TABLET | ORAL | Status: DC | PRN

## 2017-12-12 MED ORDER — OLANZAPINE 5 MG PO TBDP
5.0000 mg | ORAL_TABLET | Freq: Once | ORAL | Status: AC
  Administered 2017-12-12: 5 mg via ORAL
  Filled 2017-12-12: qty 1

## 2017-12-12 NOTE — ED Notes (Signed)
[id number] Expected date:  Expected time:  Means of arrival:  Comments: [facility]

## 2017-12-12 NOTE — ED Notes (Signed)
SBAR Report received from previous nurse. Pt received calm and visible on unit. Pt unable to focus and give meaningful answers to assessment questions related to current SI/ HI, A/V H, depression, anxiety, or pain at this time, but appears otherwise stable and free of distress. Pt reminded of camera surveillance, q 15 min rounds, and rules of the milieu. Will continue to assess.

## 2017-12-12 NOTE — ED Notes (Signed)
Bed: [facility] Expected date:  Expected time:  Means of arrival:  Comments: 

## 2017-12-12 NOTE — ED Triage Notes (Addendum)
Pt reports suicidal ideation, hallucinations, and reports that she is withdrawing from [place]. Also endorses chest pain. A&Ox4. Seen and discharged yesterday. Today, pt had a specific episode where she tied a shoe string around her neck and stated that the devil was weighing her down.

## 2017-12-12 NOTE — ED Provider Notes (Signed)
[facility]-EMERGENCY DEPT Provider Note   CSN: [id number] Arrival date & time: 12/12/17  2037     History   Chief Complaint Chief Complaint  Patient presents with  . Suicidal  . Chest Pain    HPI Sarah Mcclure is a 32 y.o. female.  HPI   32 year old female with past medical history as below here with suicidal ideation, diffuse pain, and hallucinations.  Patient was just seen here overnight and sent home after being evaluated by psychiatry without apparent inpatient need for detox or hospitalization for bipolar disorder.  Patient reportedly had been off of her medications.  The patient's mother found her wandering in the parking lot.  She was wearing hospital scrubs.  The patient has been hallucinating and hearing voices.  She is endorsing active suicidal ideation.  She reports chest pain but then reports pain "all over" and denies any shortness of breath.  She feels like her skin is crawling.  She is requesting help.  Level 5 caveat invoked as remainder of history, ROS, and physical exam limited due to patient's psychiatric instability.   Past Medical History:  Diagnosis Date  . Allergy   . Asthma    As a child  . Chronic back pain greater than 3 months duration 2013  . Depression   . Fibromyalgia     Patient Active Problem List   Diagnosis Date Noted  . Substance induced mood disorder (HCC) 12/11/2017  . Polysubstance dependence including opioid type drug, episodic abuse (HCC) 12/11/2017  . Breast lump on left side at 6 o'clock position 06/29/2013  . Breast lump on right side at 3 o'clock position 06/29/2013  . Cat allergies 05/25/2012  . Family history of systemic lupus erythematosus 04/13/2012  . Back pain 04/13/2012  . Pleuritic chest pain 04/13/2012  . Shin splints 04/13/2012    History reviewed. No pertinent surgical history.  OB History    Gravida Para Term Preterm AB Living   5 2 2   3 2    SAB TAB Ectopic Multiple Live Births   2 1              Home Medications    Prior to Admission medications   Medication Sig Start Date End Date Taking? Authorizing Provider  ibuprofen (ADVIL,MOTRIN) 200 MG tablet Take 600 mg by mouth every 6 (six) hours as needed for moderate pain.   Yes [provider]  LamoTRIgine (LAMICTAL PO) Take by mouth as directed.   Yes [provider]    Family History Family History  Problem Relation Age of Onset  . Alcohol abuse Mother   . Arthritis Mother   . Lupus Father   . Alcohol abuse Father   . Heart disease Father   . Depression Father   . Heart disease Maternal Grandfather     Social History Social History   Tobacco Use  . Smoking status: Never Smoker  . Smokeless tobacco: Never Used  Substance Use Topics  . Alcohol use: No  . Drug use: No     Allergies   Amoxicillin and Penicillins   Review of Systems Review of Systems  Unable to perform ROS: Psychiatric disorder     Physical Exam Updated Vital Signs BP 113/77 (BP Location: Left Arm)   Pulse 99   Temp 97.8 F (36.6 C) (Oral)   Resp 20   LMP 11/19/2017   SpO2 99%   Physical Exam  Constitutional: She is oriented to person, place, and time. She  appears well-developed and well-nourished. She appears distressed.  HENT:  Head: Normocephalic and atraumatic.  Eyes: Conjunctivae are normal.  Neck: Neck supple.  Cardiovascular: Normal rate, regular rhythm and normal heart sounds. Exam reveals no friction rub.  No murmur heard. Pulmonary/Chest: Effort normal and breath sounds normal. No respiratory distress. She has no wheezes. She has no rales.  Abdominal: She exhibits no distension.  Musculoskeletal: She exhibits no edema.  Neurological: She is alert and oriented to person, place, and time. She exhibits normal muscle tone.  Skin: Skin is warm. Capillary refill takes less than 2 seconds.  Psychiatric: Her mood appears anxious. Her affect is labile. Her speech is rapid and/or pressured. She is  hyperactive. Thought content is paranoid. She expresses suicidal ideation.  Nursing note and vitals reviewed.    ED Treatments / Results  Labs (all labs ordered are listed, but only abnormal results are displayed) Labs Reviewed  ACETAMINOPHEN LEVEL - Abnormal; Notable for the following components:      Result Value   Acetaminophen (Tylenol), Serum <10 (*)    All other components within normal limits  CBC - Abnormal; Notable for the following components:   WBC 13.9 (*)    All other components within normal limits  COMPREHENSIVE METABOLIC PANEL  ETHANOL  SALICYLATE LEVEL  RAPID URINE DRUG SCREEN, HOSP PERFORMED  I-STAT BETA HCG BLOOD, ED ([facility], [facility], [facility] ONLY)  I-STAT TROPONIN, ED    EKG  EKG Interpretation  Date/Time:  Friday December 12 2017 21:29:20 EST Ventricular Rate:  62 PR Interval:    QRS Duration: 104 QT Interval:  386 QTC Calculation: 392 R Axis:   87 Text Interpretation:  Sinus rhythm Short PR interval Baseline wander in lead(s) II III aVF No significant change since last tracing Confirmed by Shaune Pollack [phone]) on 12/12/2017 11:36:21 PM       Radiology Dg Chest 2 View  Result Date: 12/12/2017 CLINICAL DATA:  Chest pain EXAM: CHEST  2 VIEW COMPARISON:  None. FINDINGS: Lungs are clear.  No pleural effusion or pneumothorax. The heart is normal in size. Visualized osseous structures are within normal limits. IMPRESSION: Normal chest radiographs. Electronically Signed   By: Charline Bills M.D.   On: 12/12/2017 21:24    Procedures Procedures (including critical care time)  Medications Ordered in ED Medications  acetaminophen (TYLENOL) tablet 650 mg (not administered)  ondansetron (ZOFRAN) tablet 4 mg (not administered)  alum & mag hydroxide-simeth (MAALOX/MYLANTA) 200-200-20 MG/5ML suspension 30 mL (not administered)  OLANZapine zydis (ZYPREXA) disintegrating tablet 5 mg (5 mg Oral Given 12/12/17 2302)     Initial Impression / Assessment and Plan / ED Course    I have reviewed the triage vital signs and the nursing notes.  Pertinent labs & imaging results that were available during my care of the patient were reviewed by me and considered in my medical decision making (see chart for details).     32 year old female here with diffuse body pain, including chest pain, hallucinations, paranoia, and disordered thought.  Suspect this is multifactorial secondary to substance withdrawal versus abuse with component of possible underlying mania.  Regarding her chest pain, she has had constant chest pain since this morning and has a negative troponin with nonischemic EKG and her pain is more consistent with her diffuse pain likely due to withdrawal.  Do not suspect ACS or PE.  She is not hypoxic.  Vital signs are normal.  She is medically stable for psychiatric evaluation and disposition.  Final Clinical Impressions(s) /  ED Diagnoses   Final diagnoses:  Suicidal ideation  Atypical chest pain    ED Discharge Orders    None       Shaune Pollack, MD 12/12/17 2339

## 2017-12-13 ENCOUNTER — Emergency Department (HOSPITAL_COMMUNITY): Payer: Medicaid Other

## 2017-12-13 LAB — COMPREHENSIVE METABOLIC PANEL
ALBUMIN: 5.1 g/dL — AB (ref 3.5–5.0)
ALK PHOS: 77 U/L (ref 38–126)
ALT: 23 U/L (ref 14–54)
ANION GAP: 12 (ref 5–15)
AST: 24 U/L (ref 15–41)
BUN: 10 mg/dL (ref 6–20)
CO2: 28 mmol/L (ref 22–32)
CREATININE: 0.64 mg/dL (ref 0.44–1.00)
Calcium: 10.4 mg/dL — ABNORMAL HIGH (ref 8.9–10.3)
Chloride: 101 mmol/L (ref 101–111)
GFR calc Af Amer: >60 mL/min (ref >60)
GFR calc non Af Amer: >60 mL/min (ref >60)
GLUCOSE: 106 mg/dL — AB (ref 65–99)
Potassium: 3.8 mmol/L (ref 3.5–5.1)
SODIUM: 141 mmol/L (ref 135–145)
Total Bilirubin: 0.7 mg/dL (ref 0.3–1.2)
Total Protein: 8.5 g/dL — ABNORMAL HIGH (ref 6.5–8.1)

## 2017-12-13 LAB — CBC WITH DIFFERENTIAL/PLATELET
Basophils Absolute: 0 10*3/uL (ref 0.0–0.1)
Basophils Relative: 0 %
EOS ABS: 0 10*3/uL (ref 0.0–0.7)
Eosinophils Relative: 0 %
HEMATOCRIT: 43.1 % (ref 36.0–46.0)
HEMOGLOBIN: 15.1 g/dL — AB (ref 12.0–15.0)
LYMPHS ABS: 2.3 10*3/uL (ref 0.7–4.0)
Lymphocytes Relative: 14 %
MCH: 30.7 pg (ref 26.0–34.0)
MCHC: 35 g/dL (ref 30.0–36.0)
MCV: 87.6 fL (ref 78.0–100.0)
MONO ABS: 1.1 10*3/uL — AB (ref 0.1–1.0)
MONOS PCT: 6 %
NEUTROS PCT: 80 %
Neutro Abs: 13.1 10*3/uL — ABNORMAL HIGH (ref 1.7–7.7)
Platelets: 406 10*3/uL — ABNORMAL HIGH (ref 150–400)
RBC: 4.92 MIL/uL (ref 3.87–5.11)
RDW: 12.4 % (ref 11.5–15.5)
WBC: 16.5 10*3/uL — ABNORMAL HIGH (ref 4.0–10.5)

## 2017-12-13 LAB — I-STAT TROPONIN, ED: TROPONIN I, POC: 0 ng/mL (ref 0.00–0.08)

## 2017-12-13 LAB — URINALYSIS, ROUTINE W REFLEX MICROSCOPIC
BACTERIA UA: NONE SEEN
BILIRUBIN URINE: NEGATIVE
Glucose, UA: NEGATIVE mg/dL
KETONES UR: 80 mg/dL — AB
LEUKOCYTES UA: NEGATIVE
Nitrite: NEGATIVE
Protein, ur: NEGATIVE mg/dL
Specific Gravity, Urine: 1.017 (ref 1.005–1.030)
pH: 8 (ref 5.0–8.0)

## 2017-12-13 LAB — RAPID URINE DRUG SCREEN, HOSP PERFORMED
Amphetamines: NOT DETECTED
BARBITURATES: NOT DETECTED
BENZODIAZEPINES: POSITIVE — AB
Cocaine: NOT DETECTED
Opiates: POSITIVE — AB
TETRAHYDROCANNABINOL: POSITIVE — AB

## 2017-12-13 LAB — LIPASE, BLOOD: Lipase: 20 U/L (ref 11–51)

## 2017-12-13 MED ORDER — PROMETHAZINE HCL 25 MG PO TABS
25.0000 mg | ORAL_TABLET | Freq: Once | ORAL | Status: AC
  Administered 2017-12-13: 25 mg via ORAL
  Filled 2017-12-13: qty 1

## 2017-12-13 MED ORDER — KETOROLAC TROMETHAMINE 30 MG/ML IJ SOLN
15.0000 mg | Freq: Once | INTRAMUSCULAR | Status: AC
Start: 2017-12-13 — End: 2017-12-13
  Administered 2017-12-13: 15 mg via INTRAVENOUS
  Filled 2017-12-13: qty 1

## 2017-12-13 MED ORDER — ONDANSETRON 4 MG PO TBDP
4.0000 mg | ORAL_TABLET | Freq: Once | ORAL | Status: AC
Start: 2017-12-13 — End: 2017-12-13
  Administered 2017-12-13: 4 mg via ORAL
  Filled 2017-12-13: qty 1

## 2017-12-13 MED ORDER — LORAZEPAM 2 MG/ML IJ SOLN
2.0000 mg | Freq: Once | INTRAMUSCULAR | Status: AC
  Administered 2017-12-13: 2 mg via INTRAMUSCULAR
  Filled 2017-12-13: qty 1

## 2017-12-13 MED ORDER — DIPHENHYDRAMINE HCL 50 MG/ML IJ SOLN
50.0000 mg | Freq: Once | INTRAMUSCULAR | Status: AC
  Administered 2017-12-13: 50 mg via INTRAVENOUS
  Filled 2017-12-13: qty 1

## 2017-12-13 MED ORDER — STERILE WATER FOR INJECTION IJ SOLN
INTRAMUSCULAR | Status: AC
  Administered 2017-12-13: 10 mL
  Filled 2017-12-13: qty 10

## 2017-12-13 MED ORDER — HALOPERIDOL LACTATE 5 MG/ML IJ SOLN
2.5000 mg | Freq: Once | INTRAMUSCULAR | Status: AC
  Administered 2017-12-13: 2.5 mg via INTRAVENOUS
  Filled 2017-12-13: qty 1

## 2017-12-13 MED ORDER — HYDROXYZINE HCL 25 MG PO TABS
50.0000 mg | ORAL_TABLET | Freq: Three times a day (TID) | ORAL | Status: DC
  Administered 2017-12-13 – 2017-12-15 (×6): 50 mg via ORAL
  Filled 2017-12-13 (×6): qty 2

## 2017-12-13 MED ORDER — SODIUM CHLORIDE 0.9 % IV BOLUS (SEPSIS)
1000.0000 mL | Freq: Once | INTRAVENOUS | Status: AC
  Administered 2017-12-13: 1000 mL via INTRAVENOUS

## 2017-12-13 MED ORDER — ZIPRASIDONE MESYLATE 20 MG IM SOLR
10.0000 mg | Freq: Once | INTRAMUSCULAR | Status: AC
  Administered 2017-12-13: 10 mg via INTRAMUSCULAR
  Filled 2017-12-13: qty 20

## 2017-12-13 MED ORDER — PROMETHAZINE HCL 25 MG/ML IJ SOLN
25.0000 mg | Freq: Once | INTRAMUSCULAR | Status: AC
  Administered 2017-12-13: 25 mg via INTRAVENOUS
  Filled 2017-12-13: qty 1

## 2017-12-13 NOTE — ED Notes (Signed)
IVC papers served. 

## 2017-12-13 NOTE — ED Notes (Signed)
Patient continuing to vomit.  MD notified.

## 2017-12-13 NOTE — ED Notes (Signed)
Pt going into others rooms, hitting the code blue call light, not accepting re-direction.

## 2017-12-13 NOTE — ED Notes (Signed)
IVC papers innitiated

## 2017-12-13 NOTE — ED Notes (Signed)
Dr Isaacs at bedside 

## 2017-12-13 NOTE — ED Notes (Signed)
Pt A&O x 3, no distress noted at present.  Transferred from TCU with complaint of vomiting.  No vomiting noted at present. Pt stable.  Presented with SI and hallucinating in manic state.  Pt calmer at present.  Monitoring for safety, Q 15 min checks in effect, will continue to monitor.

## 2017-12-13 NOTE — ED Provider Notes (Signed)
I was called to reevaluate the patient for persistent nausea and vomiting.  Patient has some very mild hypercalcemia, but I suspect this is due to her albumin and mild dehydration.  She has some mild ketonuria but no evidence of UTI.  Serial exam shows soft, nontender abdomen.  Repeat plain films are negative.  Ultrasound obtained and is negative.  Troponin is negative.  No signs of acute emergent pathology.  I suspect this is due to her withdrawal.  Will continue supportive care, encourage fluids, and continue psychiatric evaluation. She has been given 2L IVF for rehydration.   Shaune PollackIsaacs, Reyn Faivre, MD 12/13/17 2103

## 2017-12-13 NOTE — ED Notes (Signed)
Patient c/o abdominal pain and chest pain.  EKG done and given to Dr. Erma HeritageIsaacs.  Patient rates pain an 8/10.  Charge nurse and Shawn StallAshley Lassiter notified of need to transfer patient to room 26 in TCU.

## 2017-12-13 NOTE — ED Notes (Signed)
Patient asking for emesis container due to nausea.  EDP notified.
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
## 2017-12-13 NOTE — ED Notes (Signed)
Patient transported to X-ray 

## 2017-12-13 NOTE — ED Notes (Signed)
Patient transferred to room 26 after x-ray.

## 2017-12-13 NOTE — Progress Notes (Signed)
TTS consulted with Nira ConnJason Berry, NP who recommends inpt treatment. EDP Shaune PollackIsaacs, Cameron, MD and pt's nurse Darel HongJudy, RN advised of the disposition. TTS to seek placement.   IVC papers in process  Princess BruinsAquicha Chieko Neises, MSW, LCSW Therapeutic Triage Specialist  [phone]

## 2017-12-13 NOTE — ED Notes (Signed)
Pt. IV removed per RN,Elaine.

## 2017-12-13 NOTE — ED Notes (Signed)
Patient still c/o nausea.  Dr. Juleen ChinaKohut notified.  Zofran 4mg  ordered.

## 2017-12-13 NOTE — ED Notes (Signed)
Patient told tech she was still nauseated and had vomited x3.  Nurse went in to assess patient.  Patient lying in bed and initially responded to nurse, but when asked if she was still nauseated she did not respond to nurse's question.  Continued to ask patient questions about her symptoms but patient would not respond verbally to nurse.

## 2017-12-13 NOTE — Progress Notes (Addendum)
This patient continues to meet inpatient criteria. CSW fax information to the following facilities:   [facility]  [facility]  [facility]  [facility]  Declined:  [facility]  Reise Hietala, LCSWA Emergency Room Clinical Social Worker [phone]   

## 2017-12-14 DIAGNOSIS — R4587 Impulsiveness: Secondary | ICD-10-CM | POA: Diagnosis not present

## 2017-12-14 DIAGNOSIS — Z975 Presence of (intrauterine) contraceptive device: Secondary | ICD-10-CM

## 2017-12-14 DIAGNOSIS — Z818 Family history of other mental and behavioral disorders: Secondary | ICD-10-CM

## 2017-12-14 DIAGNOSIS — F1920 Other psychoactive substance dependence, uncomplicated: Secondary | ICD-10-CM | POA: Diagnosis not present

## 2017-12-14 DIAGNOSIS — Z811 Family history of alcohol abuse and dependence: Secondary | ICD-10-CM | POA: Diagnosis not present

## 2017-12-14 DIAGNOSIS — F1120 Opioid dependence, uncomplicated: Secondary | ICD-10-CM

## 2017-12-14 MED ORDER — ZIPRASIDONE HCL 20 MG PO CAPS
40.0000 mg | ORAL_CAPSULE | Freq: Once | ORAL | Status: AC
  Administered 2017-12-14: 40 mg via ORAL
  Filled 2017-12-14: qty 2

## 2017-12-14 MED ORDER — ZIPRASIDONE MESYLATE 20 MG IM SOLR
10.0000 mg | Freq: Once | INTRAMUSCULAR | Status: AC
  Administered 2017-12-14: 10 mg via INTRAMUSCULAR
  Filled 2017-12-14: qty 20

## 2017-12-14 MED ORDER — LORAZEPAM 2 MG/ML IJ SOLN
2.0000 mg | Freq: Once | INTRAMUSCULAR | Status: AC
  Administered 2017-12-14: 2 mg via INTRAMUSCULAR
  Filled 2017-12-14: qty 1

## 2017-12-14 MED ORDER — DIPHENHYDRAMINE HCL 50 MG/ML IJ SOLN: 50.0000 mg | Freq: Once | INTRAMUSCULAR | Status: AC

## 2017-12-14 MED ORDER — LORAZEPAM 2 MG/ML IJ SOLN: 1.0000 mg | Freq: Once | INTRAMUSCULAR | Status: AC

## 2017-12-14 MED ORDER — TRAZODONE HCL 50 MG PO TABS: 50.0000 mg | ORAL_TABLET | Freq: Every day | ORAL | Status: DC

## 2017-12-14 MED ORDER — LORAZEPAM 1 MG PO TABS
1.0000 mg | ORAL_TABLET | Freq: Once | ORAL | Status: AC
  Administered 2017-12-14: 1 mg via ORAL
  Filled 2017-12-14: qty 1

## 2017-12-14 MED ORDER — DIPHENHYDRAMINE HCL 25 MG PO CAPS
50.0000 mg | ORAL_CAPSULE | Freq: Once | ORAL | Status: AC
  Administered 2017-12-14: 50 mg via ORAL
  Filled 2017-12-14: qty 2

## 2017-12-14 MED ORDER — ZIPRASIDONE MESYLATE 20 MG IM SOLR: 10.0000 mg | Freq: Once | INTRAMUSCULAR | Status: AC

## 2017-12-14 MED ORDER — STERILE WATER FOR INJECTION IJ SOLN
INTRAMUSCULAR | Status: AC
  Administered 2017-12-14: 10 mL
  Filled 2017-12-14: qty 10

## 2017-12-14 NOTE — ED Notes (Addendum)
[facility]  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

## 2017-12-14 NOTE — ED Notes (Signed)
Patient woke up briefly requesting for water. Appear sleepy/drowsy. Water offered and patient went right back sleeping.

## 2017-12-14 NOTE — ED Notes (Signed)
Chaplain at bedside

## 2017-12-14 NOTE — ED Notes (Signed)
Patient sleeping at this time.

## 2017-12-14 NOTE — ED Notes (Addendum)
Patient agitated stating, "I need to get out of here.  If I have to go downtown that's fine.  I want to go."

## 2017-12-14 NOTE — ED Notes (Signed)
Received call from Chaplain stating she is on her way to see patient.

## 2017-12-14 NOTE — Consult Note (Addendum)
[place] Psychiatry Consult   Reason for Consult:  ER admission and initial expression of suicidal ideation and auditory and visual hallucinations. Referring Physician:  EDP Patient Identification: Sarah Mcclure MRN:  [id number] Principal Diagnosis: Polysubstance dependence including opioid type drug, episodic abuse [facility]) Diagnosis:   Patient Active Problem List   Diagnosis Date Noted  . Substance induced mood disorder ([place]) [F19.94] 12/11/2017  . Polysubstance dependence including opioid type drug, episodic abuse ([place]) [F11.20, F19.20] 12/11/2017  . Breast lump on left side at 6 o'clock position [N63.20] 06/29/2013  . Breast lump on right side at 3 o'clock position [N63.10] 06/29/2013  . Cat allergies [J30.81] 05/25/2012  . Family history of systemic lupus erythematosus [Z82.69] 04/13/2012  . Back pain [M54.9] 04/13/2012  . Pleuritic chest pain [R07.81] 04/13/2012  . Shin splints [T70.177L] 04/13/2012    Total Time spent with patient: 30 minutes  HPI:  Sarah Mcclure is a 32 y.o. female patient admitted to the ED following personal and partner concern for increasing mania and suicidal ideation. Patient's partner expressed concern for increasing paranoia over the last few days. Patient notes that she has a history of drug use and feels that the drugs have made things worse for her and may be the cause of what was going on. Patient has had a good nights sleep and states that she feels well rested. Patient notes that she took drugs that were in a gram bag but is unable to states what the drug was only that she found it and did not tell anyone else and when she took it had a bad reaction. Patient states a personal history significant for long term drug use and notes that in just the last year she walked away from her job, her family and her kids. Patient notes prior attempts to get her into out patient treatment and she does not endorse following up. This morning patient denies suicidal  ideation, and thoughts of hurting herself or others. Patient notes that she was ot intentionally trying to hurt herself. Patient denies AVH. Patient unable to contract with this provider for safety and has no social supports for safety if able to leave the hospital. Pt would benefit from an inpatient psychiatric admission for medication management and crisis stabilization.   Past Psychiatric History:  Bipolar I Disorder Substance Use Disorder   Risk to Self: Suicidal Ideation: Yes-Currently Present Suicidal Intent: No Is patient at risk for suicide?: Yes Suicidal Plan?: No Access to Means: No What has been your use of drugs/alcohol within the last 12 months?: pt admits to using what she thought was [place] but states she does not know what it was, per chart, pt also using marijuana and alcohol  How many times?: 3(per chart review ) Triggers for Past Attempts: Unknown Intentional Self Injurious Behavior: None Risk to Others: Homicidal Ideation: No Thoughts of Harm to Others: No Current Homicidal Intent: No Current Homicidal Plan: No Access to Homicidal Means: No History of harm to others?: No Assessment of Violence: None Noted Does patient have access to weapons?: No Criminal Charges Pending?: No Does patient have a court date: No Prior Inpatient Therapy: Prior Inpatient Therapy: Yes Prior Therapy Dates: 2012 Prior Therapy Facilty/Provider(s): [facility] and [facility] Reason for Treatment: overdose Prior Outpatient Therapy: Prior Outpatient Therapy: Yes Prior Therapy Dates: 10/2017 Prior Therapy Facilty/Provider(s): [facility] Reason for Treatment: biploar disorder Does patient have an ACCT team?: No Does patient have Intensive In-House Services?  : No Does patient have [employer]  services? : Yes Does patient have P4CC services?: No  Past Medical History:  Past Medical History:  Diagnosis Date  . Allergy   . Asthma    As a child  . Chronic back pain greater than 3 months duration  2013  . Depression   . Fibromyalgia    History reviewed. No pertinent surgical history. Family History:  Family History  Problem Relation Age of Onset  . Alcohol abuse Mother   . Arthritis Mother   . Lupus Father   . Alcohol abuse Father   . Heart disease Father   . Depression Father   . Heart disease Maternal Grandfather     Social History:  Social History   Substance and Sexual Activity  Alcohol Use No     Social History   Substance and Sexual Activity  Drug Use No    Social History   Socioeconomic History  . Marital status: Single    Spouse name: None  . Number of children: None  . Years of education: None  . Highest education level: None  Social Needs  . Financial resource strain: None  . Food insecurity - worry: None  . Food insecurity - inability: None  . Transportation needs - medical: None  . Transportation needs - non-medical: None  Occupational History  . None  Tobacco Use  . Smoking status: Never Smoker  . Smokeless tobacco: Never Used  Substance and Sexual Activity  . Alcohol use: No  . Drug use: No  . Sexual activity: Yes    Birth control/protection: IUD  Other Topics Concern  . None  Social History Narrative  . None   Allergies:   Allergies  Allergen Reactions  . Amoxicillin Other (See Comments)    Has patient had a PCN reaction causing immediate rash, facial/tongue/throat swelling, SOB or lightheadedness with hypotension: Unknown Has patient had a PCN reaction causing severe rash involving mucus membranes or skin necrosis: No Has patient had a PCN reaction that required hospitalization: Unknown Has patient had a PCN reaction occurring within the last 10 years: No If all of the above answers are "NO", then may proceed with Cephalosporin use.   Marland Kitchen Penicillins Other (See Comments)    Has patient had a PCN reaction causing immediate rash, facial/tongue/throat swelling, SOB or lightheadedness with hypotension: Unknown Has patient had a PCN  reaction causing severe rash involving mucus membranes or skin necrosis: No Has patient had a PCN reaction that required hospitalization: Unknown Has patient had a PCN reaction occurring within the last 10 years: No If all of the above answers are "NO", then may proceed with Cephalosporin use.     Labs:  Results for orders placed or performed during the hospital encounter of 12/12/17 (from the past 48 hour(s))  Rapid urine drug screen (hospital performed)     Status: Abnormal   Collection Time: 12/12/17  8:59 PM  Result Value Ref Range   Opiates POSITIVE (A) NONE DETECTED   Cocaine NONE DETECTED NONE DETECTED   Benzodiazepines POSITIVE (A) NONE DETECTED   Amphetamines NONE DETECTED NONE DETECTED   Tetrahydrocannabinol POSITIVE (A) NONE DETECTED   Barbiturates NONE DETECTED NONE DETECTED    Comment: (NOTE) DRUG SCREEN FOR MEDICAL PURPOSES ONLY.  IF CONFIRMATION IS NEEDED FOR ANY PURPOSE, NOTIFY LAB WITHIN 5 DAYS. LOWEST DETECTABLE LIMITS FOR URINE DRUG SCREEN Drug Class                     Cutoff (ng/mL) Amphetamine and metabolites  1000 Barbiturate and metabolites    200 Benzodiazepine                 174 Tricyclics and metabolites     300 Opiates and metabolites        300 Cocaine and metabolites        300 THC                            50 Performed at [facility], [place] [address], [place], [place] [address]   Comprehensive metabolic panel     Status: None   Collection Time: 12/12/17  9:33 PM  Result Value Ref Range   Sodium 140 135 - 145 mmol/L   Potassium 3.5 3.5 - 5.1 mmol/L   Chloride 107 101 - 111 mmol/L   CO2 25 22 - 32 mmol/L   Glucose, Bld 91 65 - 99 mg/dL   BUN 10 6 - 20 mg/dL   Creatinine, Ser 0.68 0.44 - 1.00 mg/dL   Calcium 9.4 8.9 - 10.3 mg/dL   Total Protein 6.7 6.5 - 8.1 g/dL   Albumin 4.2 3.5 - 5.0 g/dL   AST 17 15 - 41 U/L   ALT 18 14 - 54 U/L   Alkaline Phosphatase 63 38 - 126 U/L   Total Bilirubin 0.7 0.3 - 1.2 mg/dL   GFR  calc non Af Amer >60 >60 mL/min   GFR calc Af Amer >60 >60 mL/min    Comment: (NOTE) The eGFR has been calculated using the CKD EPI equation. This calculation has not been validated in all clinical situations. eGFR's persistently <60 mL/min signify possible Chronic Kidney Disease.    Anion gap 8 5 - 15    Comment: Performed at [facility], [place] [address], [place], [place] [address]  cbc     Status: Abnormal   Collection Time: 12/12/17  9:33 PM  Result Value Ref Range   WBC 13.9 (H) 4.0 - 10.5 K/uL   RBC 4.29 3.87 - 5.11 MIL/uL   Hemoglobin 13.0 12.0 - 15.0 g/dL   HCT 37.0 36.0 - 46.0 %   MCV 86.2 78.0 - 100.0 fL   MCH 30.3 26.0 - 34.0 pg   MCHC 35.1 30.0 - 36.0 g/dL   RDW 12.3 11.5 - 15.5 %   Platelets 315 150 - 400 K/uL    Comment: Performed at [facility], [place] [address], [place], [place] [address]  Ethanol     Status: None   Collection Time: 12/12/17  9:34 PM  Result Value Ref Range   Alcohol, Ethyl (B) <10 <10 mg/dL    Comment:        LOWEST DETECTABLE LIMIT FOR SERUM ALCOHOL IS 10 mg/dL FOR MEDICAL PURPOSES ONLY Performed at [facility], [place] [address], [place], [place] [address]   Salicylate level     Status: None   Collection Time: 12/12/17  9:34 PM  Result Value Ref Range   Salicylate Lvl <7.0 2.8 - 30.0 mg/dL    Comment: <7.0 Performed at [facility], [place] [address], [place], [place] [address]   Acetaminophen level     Status: Abnormal   Collection Time: 12/12/17  9:34 PM  Result Value Ref Range   Acetaminophen (Tylenol), Serum <10 (L) 10 - 30 ug/mL    Comment:        THERAPEUTIC CONCENTRATIONS VARY SIGNIFICANTLY. A RANGE OF 10-30 ug/mL  MAY BE AN EFFECTIVE CONCENTRATION FOR MANY PATIENTS. HOWEVER, SOME ARE BEST TREATED AT CONCENTRATIONS OUTSIDE THIS RANGE. ACETAMINOPHEN CONCENTRATIONS >150 ug/mL AT 4 HOURS AFTER INGESTION AND >50 ug/mL AT 12 HOURS AFTER INGESTION ARE OFTEN  ASSOCIATED WITH TOXIC REACTIONS. Performed at [facility], [place] [address], [place], [place] [address]   I-Stat beta hCG blood, ED     Status: None   Collection Time: 12/12/17  9:45 PM  Result Value Ref Range   I-stat hCG, quantitative <5.0 <5 mIU/mL   Comment 3            Comment:   GEST. AGE      CONC.  (mIU/mL)   <=1 WEEK        5 - 50     2 WEEKS       50 - 500     3 WEEKS       100 - 10,000     4 WEEKS     1,000 - 30,000        FEMALE AND NON-PREGNANT FEMALE:     LESS THAN 5 mIU/mL   I-stat troponin, ED     Status: None   Collection Time: 12/12/17  9:45 PM  Result Value Ref Range   Troponin i, poc 0.00 0.00 - 0.08 ng/mL   Comment 3            Comment: Due to the release kinetics of cTnI, a negative result within the first hours of the onset of symptoms does not rule out myocardial infarction with certainty. If myocardial infarction is still suspected, repeat the test at appropriate intervals.   Comprehensive metabolic panel     Status: Abnormal   Collection Time: 12/13/17  6:36 PM  Result Value Ref Range   Sodium 141 135 - 145 mmol/L   Potassium 3.8 3.5 - 5.1 mmol/L   Chloride 101 101 - 111 mmol/L   CO2 28 22 - 32 mmol/L   Glucose, Bld 106 (H) 65 - 99 mg/dL   BUN 10 6 - 20 mg/dL   Creatinine, Ser 0.64 0.44 - 1.00 mg/dL   Calcium 10.4 (H) 8.9 - 10.3 mg/dL   Total Protein 8.5 (H) 6.5 - 8.1 g/dL   Albumin 5.1 (H) 3.5 - 5.0 g/dL   AST 24 15 - 41 U/L   ALT 23 14 - 54 U/L   Alkaline Phosphatase 77 38 - 126 U/L   Total Bilirubin 0.7 0.3 - 1.2 mg/dL   GFR calc non Af Amer >60 >60 mL/min   GFR calc Af Amer >60 >60 mL/min    Comment: (NOTE) The eGFR has been calculated using the CKD EPI equation. This calculation has not been validated in all clinical situations. eGFR's persistently <60 mL/min signify possible Chronic Kidney Disease.    Anion gap 12 5 - 15    Comment: Performed at [facility], [place] [address], [place], [place]  [address]  Lipase, blood     Status: None   Collection Time: 12/13/17  6:36 PM  Result Value Ref Range   Lipase 20 11 - 51 U/L    Comment: Performed at [facility], [place] [address], [place], [place] [address]  CBC with Differential     Status: Abnormal   Collection Time: 12/13/17  6:36 PM  Result Value Ref Range   WBC 16.5 (H) 4.0 - 10.5 K/uL   RBC 4.92 3.87 - 5.11 MIL/uL   Hemoglobin 15.1 (H) 12.0 -  15.0 g/dL   HCT 43.1 36.0 - 46.0 %   MCV 87.6 78.0 - 100.0 fL   MCH 30.7 26.0 - 34.0 pg   MCHC 35.0 30.0 - 36.0 g/dL   RDW 12.4 11.5 - 15.5 %   Platelets 406 (H) 150 - 400 K/uL   Neutrophils Relative % 80 %   Neutro Abs 13.1 (H) 1.7 - 7.7 K/uL   Lymphocytes Relative 14 %   Lymphs Abs 2.3 0.7 - 4.0 K/uL   Monocytes Relative 6 %   Monocytes Absolute 1.1 (H) 0.1 - 1.0 K/uL   Eosinophils Relative 0 %   Eosinophils Absolute 0.0 0.0 - 0.7 K/uL   Basophils Relative 0 %   Basophils Absolute 0.0 0.0 - 0.1 K/uL    Comment: Performed at [facility], [place] [address] [place], [place] [address]  I-Stat Troponin, ED (not at [facility])     Status: None   Collection Time: 12/13/17  7:07 PM  Result Value Ref Range   Troponin i, poc 0.00 0.00 - 0.08 ng/mL   Comment 3            Comment: Due to the release kinetics of cTnI, a negative result within the first hours of the onset of symptoms does not rule out myocardial infarction with certainty. If myocardial infarction is still suspected, repeat the test at appropriate intervals.   Urinalysis, Routine w reflex microscopic     Status: Abnormal   Collection Time: 12/13/17  7:24 PM  Result Value Ref Range   Color, Urine YELLOW YELLOW   APPearance CLEAR CLEAR   Specific Gravity, Urine 1.017 1.005 - 1.030   pH 8.0 5.0 - 8.0   Glucose, UA NEGATIVE NEGATIVE mg/dL   Hgb urine dipstick SMALL (A) NEGATIVE   Bilirubin Urine NEGATIVE NEGATIVE   Ketones, ur 80 (A) NEGATIVE mg/dL   Protein, ur NEGATIVE NEGATIVE mg/dL    Nitrite NEGATIVE NEGATIVE   Leukocytes, UA NEGATIVE NEGATIVE   RBC / HPF 0-5 0 - 5 RBC/hpf   WBC, UA 0-5 0 - 5 WBC/hpf   Bacteria, UA NONE SEEN NONE SEEN   Squamous Epithelial / LPF 0-5 (A) NONE SEEN   Budding Yeast PRESENT     Comment: Performed at [facility], [place] [address] [place], [place] [address]    Current Facility-Administered Medications  Medication Dose Route Frequency Provider Last Rate Last Dose  . acetaminophen (TYLENOL) tablet 650 mg  650 mg Oral Q4H PRN Duffy Bruce, MD      . alum & mag hydroxide-simeth (MAALOX/MYLANTA) 200-200-20 MG/5ML suspension 30 mL  30 mL Oral Q6H PRN Duffy Bruce, MD      . hydrOXYzine (ATARAX/VISTARIL) tablet 50 mg  50 mg Oral TID Ethelene Hal, NP   50 mg at 12/14/17 1047  . ondansetron (ZOFRAN) tablet 4 mg  4 mg Oral Q8H PRN Duffy Bruce, MD   4 mg at 12/14/17 1335   Current Outpatient Medications  Medication Sig Dispense Refill  . ibuprofen (ADVIL,MOTRIN) 200 MG tablet Take 600 mg by mouth every 6 (six) hours as needed for moderate pain.    . LamoTRIgine (LAMICTAL PO) Take by mouth as directed.      Musculoskeletal: Strength & Muscle Tone: within normal limits Gait & Station: normal Patient leans: N/A  Psychiatric Specialty Exam: Physical Exam  Constitutional: She is oriented to person, place, and time. She appears well-developed and well-nourished.  Neck: Normal range of motion.  Neurological: She is alert  and oriented to person, place, and time. She has normal strength.  Skin: Skin is warm and dry.  Psychiatric: She has a normal mood and affect. Her speech is normal and behavior is normal. Thought content normal. Cognition and memory are normal. She expresses impulsivity.    ROS  Blood pressure 138/79, pulse (!) 56, temperature 99.7 F (37.6 C), temperature source Oral, resp. rate 18, last menstrual period 11/19/2017, SpO2 100 %.There is no height or weight on file to calculate BMI.  General  Appearance: Casual  Eye Contact:  Minimal  Speech:  Clear and Coherent  Volume:  Normal  Mood:  Euthymic  Affect:  Full Range  Thought Process:  Coherent  Orientation:  Full (Time, Place, and Person)  Thought Content:  Logical  Suicidal Thoughts:  No  Homicidal Thoughts:  No  Memory:  Immediate;   Good Recent;   Good Remote;   Good  Judgement:  Impaired  Insight:  Lacking  Psychomotor Activity:  Normal  Concentration:  Concentration: Fair  Recall:  Good  Fund of Knowledge:  Good  Language:  Good  Akathisia:  No  Handed:  Right  AIMS (if indicated):     Assets:  Communication Skills Resilience  ADL's:  Intact  Cognition:  WNL  Sleep:        Treatment Plan Summary: Daily contact with patient to assess and evaluate symptoms and progress in treatment and Medication management (see MAR)  Disposition: Recommend psychiatric Inpatient admission when medically cleared. TTS to seek placement  Ethelene Hal, NP 12/14/2017 1:45 PM    Patient seen face to face for this evaluation, case discussed with treatment team and physician extender and formulated treatment plan. Reviewed the information documented and agree with the treatment plan.  Ambrose Finland, MD

## 2017-12-14 NOTE — Progress Notes (Signed)
This patient continues to meet inpatient criteria. CSW fax information to the following facilities:   [facility]  [facility]  [facility]  [facility]  Declined:  [facility]  Karri Kallenbach, [place] Emergency Room Clinical Social Worker [phone]

## 2017-12-14 NOTE — ED Notes (Signed)
Patient requesting to speak with chaplain.  Clinical cytogeneticistWriter paged chaplain.  Awaiting call back.

## 2017-12-14 NOTE — ED Notes (Addendum)
Patient became agitated while speaking with mother on the telephone.  Patient became loud and hit wall x1.  Telephone call terminated and patient verbally deescalated by staff.  Patient receptive.

## 2017-12-14 NOTE — ED Notes (Signed)
Received call from patient's mother checking on progress of patient.  Mother states she will be in to see patient this afternoon.

## 2017-12-14 NOTE — Progress Notes (Signed)
Name: Sarah Mcclure  Location: [facility]  Ashby DawesNature of call: Emotional and Social Support.  Chaplain was called to provide emotional support and a listening ear. When Chaplain arrived Pt was fidgety. She was also concern about being able to go home. Chaplain explained that she still need treatment and detox. Pt. said she understood and was willing to comply with the treatment plan. Chaplain shared time listening to the Pt express her fears and hopes about the future.

## 2017-12-14 NOTE — ED Notes (Signed)
Patient observed being restless; walking up and down hallway, in and out of room. Patient continues to pick up phone and dial then hang up. Patient has been redirected from looking into other patients rooms, particularly room 34. Patient is talking to self and appears agitated.

## 2017-12-14 NOTE — ED Notes (Signed)
Patient taking shower.

## 2017-12-15 ENCOUNTER — Other Ambulatory Visit: Payer: Self-pay

## 2017-12-15 ENCOUNTER — Inpatient Hospital Stay (HOSPITAL_COMMUNITY)
Admission: AD | Admit: 2017-12-15 | Discharge: 2017-12-19 | DRG: 885 | Disposition: A | Discharge status: Home / Self Care | Payer: Medicaid Other | Source: Intra-hospital | Attending: Psychiatry | Admitting: Psychiatry

## 2017-12-15 ENCOUNTER — Encounter (HOSPITAL_COMMUNITY): Payer: Self-pay | Admitting: *Deleted

## 2017-12-15 ENCOUNTER — Emergency Department (HOSPITAL_COMMUNITY): Payer: Medicaid Other

## 2017-12-15 DIAGNOSIS — R450 Nervousness: Secondary | ICD-10-CM | POA: Diagnosis not present

## 2017-12-15 DIAGNOSIS — R112 Nausea with vomiting, unspecified: Secondary | ICD-10-CM

## 2017-12-15 DIAGNOSIS — F1920 Other psychoactive substance dependence, uncomplicated: Secondary | ICD-10-CM | POA: Diagnosis present

## 2017-12-15 DIAGNOSIS — F1390 Sedative, hypnotic, or anxiolytic use, unspecified, uncomplicated: Secondary | ICD-10-CM | POA: Diagnosis not present

## 2017-12-15 DIAGNOSIS — Z91410 Personal history of adult physical and sexual abuse: Secondary | ICD-10-CM

## 2017-12-15 DIAGNOSIS — F419 Anxiety disorder, unspecified: Secondary | ICD-10-CM | POA: Diagnosis present

## 2017-12-15 DIAGNOSIS — Z79899 Other long term (current) drug therapy: Secondary | ICD-10-CM

## 2017-12-15 DIAGNOSIS — R45851 Suicidal ideations: Secondary | ICD-10-CM | POA: Diagnosis present

## 2017-12-15 DIAGNOSIS — Z818 Family history of other mental and behavioral disorders: Secondary | ICD-10-CM

## 2017-12-15 DIAGNOSIS — Z811 Family history of alcohol abuse and dependence: Secondary | ICD-10-CM | POA: Diagnosis not present

## 2017-12-15 DIAGNOSIS — F1190 Opioid use, unspecified, uncomplicated: Secondary | ICD-10-CM | POA: Diagnosis not present

## 2017-12-15 DIAGNOSIS — R441 Visual hallucinations: Secondary | ICD-10-CM | POA: Diagnosis not present

## 2017-12-15 DIAGNOSIS — F4310 Post-traumatic stress disorder, unspecified: Secondary | ICD-10-CM | POA: Diagnosis present

## 2017-12-15 DIAGNOSIS — G4700 Insomnia, unspecified: Secondary | ICD-10-CM | POA: Diagnosis present

## 2017-12-15 DIAGNOSIS — Z915 Personal history of self-harm: Secondary | ICD-10-CM | POA: Diagnosis not present

## 2017-12-15 DIAGNOSIS — F315 Bipolar disorder, current episode depressed, severe, with psychotic features: Principal | ICD-10-CM | POA: Diagnosis present

## 2017-12-15 DIAGNOSIS — F17210 Nicotine dependence, cigarettes, uncomplicated: Secondary | ICD-10-CM | POA: Diagnosis present

## 2017-12-15 DIAGNOSIS — R440 Auditory hallucinations: Secondary | ICD-10-CM | POA: Diagnosis not present

## 2017-12-15 DIAGNOSIS — Z880 Allergy status to penicillin: Secondary | ICD-10-CM

## 2017-12-15 DIAGNOSIS — F319 Bipolar disorder, unspecified: Secondary | ICD-10-CM

## 2017-12-15 DIAGNOSIS — F1120 Opioid dependence, uncomplicated: Secondary | ICD-10-CM | POA: Diagnosis present

## 2017-12-15 DIAGNOSIS — Z630 Problems in relationship with spouse or partner: Secondary | ICD-10-CM | POA: Diagnosis not present

## 2017-12-15 DIAGNOSIS — F1290 Cannabis use, unspecified, uncomplicated: Secondary | ICD-10-CM | POA: Diagnosis not present

## 2017-12-15 DIAGNOSIS — R4587 Impulsiveness: Secondary | ICD-10-CM | POA: Diagnosis not present

## 2017-12-15 DIAGNOSIS — Z975 Presence of (intrauterine) contraceptive device: Secondary | ICD-10-CM | POA: Diagnosis not present

## 2017-12-15 MED ORDER — ONDANSETRON 4 MG PO TBDP: 4.0000 mg | ORAL_TABLET | Freq: Four times a day (QID) | ORAL | Status: DC | PRN

## 2017-12-15 MED ORDER — NAPROXEN 500 MG PO TABS
500.0000 mg | ORAL_TABLET | Freq: Two times a day (BID) | ORAL | Status: DC | PRN
  Administered 2017-12-15 – 2017-12-19 (×4): 500 mg via ORAL
  Filled 2017-12-15 (×4): qty 1

## 2017-12-15 MED ORDER — LOPERAMIDE HCL 2 MG PO CAPS: 2.0000 mg | ORAL_CAPSULE | ORAL | Status: DC | PRN

## 2017-12-15 MED ORDER — CLONIDINE HCL 0.1 MG PO TABS
0.1000 mg | ORAL_TABLET | Freq: Four times a day (QID) | ORAL | Status: AC
  Administered 2017-12-15 – 2017-12-17 (×8): 0.1 mg via ORAL
  Filled 2017-12-15 (×13): qty 1

## 2017-12-15 MED ORDER — TRAZODONE HCL 50 MG PO TABS
50.0000 mg | ORAL_TABLET | Freq: Every day | ORAL | Status: DC
  Administered 2017-12-15 – 2017-12-18 (×4): 50 mg via ORAL
  Filled 2017-12-15 (×6): qty 1

## 2017-12-15 MED ORDER — HYDROXYZINE HCL 50 MG PO TABS
50.0000 mg | ORAL_TABLET | Freq: Three times a day (TID) | ORAL | Status: DC
  Administered 2017-12-16 (×2): 50 mg via ORAL
  Filled 2017-12-15 (×8): qty 1

## 2017-12-15 MED ORDER — MAGNESIUM HYDROXIDE 400 MG/5ML PO SUSP: 30.0000 mL | Freq: Every day | ORAL | Status: DC | PRN

## 2017-12-15 MED ORDER — ZIPRASIDONE MESYLATE 20 MG IM SOLR: 20.0000 mg | Freq: Two times a day (BID) | INTRAMUSCULAR | Status: DC | PRN

## 2017-12-15 MED ORDER — CLONIDINE HCL 0.1 MG PO TABS
0.1000 mg | ORAL_TABLET | ORAL | Status: DC
  Administered 2017-12-18 – 2017-12-19 (×3): 0.1 mg via ORAL
  Filled 2017-12-15 (×4): qty 1

## 2017-12-15 MED ORDER — DIPHENHYDRAMINE HCL 50 MG/ML IJ SOLN
50.0000 mg | Freq: Once | INTRAMUSCULAR | Status: AC
  Administered 2017-12-15: 50 mg via INTRAMUSCULAR
  Filled 2017-12-15: qty 1

## 2017-12-15 MED ORDER — ONDANSETRON HCL 4 MG PO TABS: 4.0000 mg | ORAL_TABLET | Freq: Three times a day (TID) | ORAL | Status: DC | PRN

## 2017-12-15 MED ORDER — STERILE WATER FOR INJECTION IJ SOLN
INTRAMUSCULAR | Status: AC
  Administered 2017-12-15: 10 mL
  Filled 2017-12-15: qty 10

## 2017-12-15 MED ORDER — HYDROXYZINE HCL 25 MG PO TABS
25.0000 mg | ORAL_TABLET | Freq: Four times a day (QID) | ORAL | Status: DC | PRN
  Administered 2017-12-15: 25 mg via ORAL
  Filled 2017-12-15: qty 1

## 2017-12-15 MED ORDER — CLONIDINE HCL 0.1 MG PO TABS
0.1000 mg | ORAL_TABLET | Freq: Every day | ORAL | Status: DC
  Filled 2017-12-15: qty 1

## 2017-12-15 MED ORDER — ACETAMINOPHEN 325 MG PO TABS: 650.0000 mg | ORAL_TABLET | Freq: Four times a day (QID) | ORAL | Status: DC | PRN

## 2017-12-15 MED ORDER — LORAZEPAM 2 MG/ML IJ SOLN
1.0000 mg | Freq: Once | INTRAMUSCULAR | Status: AC
  Administered 2017-12-15: 1 mg via INTRAMUSCULAR
  Filled 2017-12-15: qty 1

## 2017-12-15 MED ORDER — DICYCLOMINE HCL 20 MG PO TABS: 20.0000 mg | ORAL_TABLET | Freq: Four times a day (QID) | ORAL | Status: DC | PRN

## 2017-12-15 MED ORDER — ALUM & MAG HYDROXIDE-SIMETH 200-200-20 MG/5ML PO SUSP
30.0000 mL | ORAL | Status: DC | PRN
  Administered 2017-12-16: 30 mL via ORAL
  Filled 2017-12-15: qty 30

## 2017-12-15 MED ORDER — LORAZEPAM 1 MG PO TABS: 1.0000 mg | ORAL_TABLET | ORAL | Status: DC | PRN

## 2017-12-15 MED ORDER — METHOCARBAMOL 500 MG PO TABS
500.0000 mg | ORAL_TABLET | Freq: Three times a day (TID) | ORAL | Status: DC | PRN
  Administered 2017-12-17: 500 mg via ORAL
  Filled 2017-12-15 (×2): qty 1

## 2017-12-15 MED ORDER — ZIPRASIDONE MESYLATE 20 MG IM SOLR
10.0000 mg | Freq: Once | INTRAMUSCULAR | Status: AC
  Administered 2017-12-15: 10 mg via INTRAMUSCULAR
  Filled 2017-12-15: qty 20

## 2017-12-15 NOTE — ED Notes (Signed)
Patient complained of nausea

## 2017-12-15 NOTE — ED Notes (Signed)
Pt was given IM medication and she allowed it to be given without difficulty. She has calmed down and is not emotional or labile. She continues to be anxious about her admission.

## 2017-12-15 NOTE — BH Assessment (Signed)
[facility] Assessment Progress Note  Per Juanetta Beets, DO, this pt requires psychiatric hospitalization.  Malva Limes, RN, [facility] has assigned pt to [facility] Rm 507-[facility]; she will call when [facility] is ready to receive pt.  Pt presents under IVC initiated by EDP Linwood Dibbles, MD, and IVC documents have been faxed to [facility].  Pt's nurse, Diane, has been notified, and agrees to call report to [phone].  Pt is to be transported via Patent examiner.   Doylene Canning, [place] Behavioral Health Coordinator [phone]

## 2017-12-15 NOTE — ED Notes (Signed)
Pt's behavior has become increased in restlessness and she is labile and attention seeking. When she gets on the telephone she has difficulty staying in control of her emotions and starts raving and crying, especially when she talks with her mother. She spoke with a staff member and revealed that she has a plan for escaping.

## 2017-12-15 NOTE — ED Provider Notes (Signed)
Called to evaluate pt for nausea.  Reviewed labs, no sign of UTI, pregnancy test negative. Abdominal exam benign.  Suspect likely withdrawal related nausea.  Recommend continued nausea medications.  Patient has tolerated lunch without emesis. [facility].    Sarah Mcclure, Sarah Garlick, MD 12/15/17 [phone]

## 2017-12-15 NOTE — ED Notes (Signed)
Pt has vomited x2 this morning.

## 2017-12-15 NOTE — ED Notes (Signed)
Patient left ambulatory and in a stable condition via law enforcement. Patient has no belongings.

## 2017-12-15 NOTE — Tx Team (Signed)
Initial Treatment Plan 12/15/2017 10:16 PM Sarah MurphyLaura M Alter [id number]    PATIENT STRESSORS: Marital or family conflict Medication change or noncompliance Substance abuse   PATIENT STRENGTHS: Ability for insight Average or above average intelligence Capable of independent living General fund of knowledge Motivation for treatment/growth   PATIENT IDENTIFIED PROBLEMS: Depression Mania "Get me back stable on my medications"                     DISCHARGE CRITERIA:  Ability to meet basic life and health needs Improved stabilization in mood, thinking, and/or behavior Verbal commitment to aftercare and medication compliance  PRELIMINARY DISCHARGE PLAN: Attend aftercare/continuing care group Return to previous living arrangement  PATIENT/FAMILY INVOLVEMENT: This treatment plan has been presented to and reviewed with the patient, Sarah Mcclure, and/or family member, .  The patient and family have been given the opportunity to ask questions and make suggestions.  Pranathi Winfree, [place] MillsBrook Wayne, [place]RN 12/15/2017, 10:16 PM

## 2017-12-15 NOTE — Progress Notes (Signed)
Sarah Mcclure is a 32 year old female pt admitted on involuntary basis. On admission, she spoke about how she believes her mother got her here and spoke about how her mother said that she was suicidal and trying to kill herself with her shoelaces. She adamantly denies any of this and says that she was never suicidal and is able to contract for safety while in the hospital. She did share that she was on lamictal and she spoke about how [facility] but then continued with her PCP who was giving her medications. She reports her PCP was Dr. Ronne BinningMcKenzie and reports he just lost his license and reports that she is going to need a new provider. She spoke about how she was tapering herself off lamictal as she felt she did not need them anymore and spoke about how she was using molly and then went into a manic state. She does report use of benzos and opiates and reports they were prescribed for her and also endorses marijuana usage, and denies any alcohol abuse. She reports that she needs to get on medicine to help stabilize her. She reports that she lives with her partner Sarah Mcclure and reports that she will go back there once she is discharged. She was oriented to the unit and safety maintained.

## 2017-12-15 NOTE — BH Assessment (Signed)
[facility]. AC to coordinate.

## 2017-12-15 NOTE — Consult Note (Addendum)
[facility] Consult   Reason for Consult:  ED admission with coinciding substance abuse Referring Physician:  EDP Patient Identification: Sarah Mcclure MRN:  [id number] Principal Diagnosis: Polysubstance dependence including opioid type drug, episodic abuse [facility]) Diagnosis:   Patient Active Problem List   Diagnosis Date Noted  . Substance induced mood disorder ([place]) [F19.94] 12/11/2017  . Polysubstance dependence including opioid type drug, episodic abuse ([place]) [F11.20, F19.20] 12/11/2017  . Breast lump on left side at 6 o'clock position [N63.20] 06/29/2013  . Breast lump on right side at 3 o'clock position [N63.10] 06/29/2013  . Cat allergies [J30.81] 05/25/2012  . Family history of systemic lupus erythematosus [Z82.69] 04/13/2012  . Back pain [M54.9] 04/13/2012  . Pleuritic chest pain [R07.81] 04/13/2012  . Shin splints [Q30.092Z] 04/13/2012    Total Time spent with patient: 30 minutes  HPI:   Sarah Mcclure is a 32 y.o. female patient admitted to the ED following concern for suicidal ideation and substance use withdrawal. Of note patient has recent ER admission for similar issue: 12/11/17 due to the pt being manic for several days and expressing paranoid ideations. Pt was subsequently d/c from the ED on 12/11/17 and given substance abuse treatment resources. Patient states that she had taken a gram bag of unknown substance that she had hidden from her other roommates. Patient notes that she has felt very ill since coming off of this unknown substance and yesterday had to receive IV fluids with additional nausea medications. Patient states when she initially came in she was hallucinating, but is no longer having this issue. Patient denies suicidal ideation today but did note suicidal ideation with a plan yesterday. Patient notes that she is aware of community resources for substance use and would like to go home. Pt has been intrusive, pacing, and demanding while in the ED.  Pt was over heard having a loud argument on the phone with her mother today. Pt's mother had been here earlier to visit and Pt's behavior escalated and her mother was asked to leave. Pt would benefit from an inpatient psychiatric admission for crisis stabilization and medication  Management.  Past Psychiatric History:  Bipolar I disorder and stimulant use disorder.   Risk to Self: Suicidal Ideation: None currently.  Risk to Others: Homicidal Ideation: No Thoughts of Harm to Others: No Current Homicidal Intent: No Current Homicidal Plan: No Access to Homicidal Means: No History of harm to others?: No Assessment of Violence: None Noted Does patient have access to weapons?: No Criminal Charges Pending?: No Does patient have a court date: No Prior Inpatient Therapy: Prior Inpatient Therapy: Yes Prior Therapy Dates: 2012 Prior Therapy Facilty/Provider(s): [facility] and [facility] Reason for Treatment: overdose Prior Outpatient Therapy: Prior Outpatient Therapy: Yes Prior Therapy Dates: 10/2017 Prior Therapy Facilty/Provider(s): [facility] Reason for Treatment: biploar disorder Does patient have an ACCT team?: No Does patient have Intensive In-House Services?  : No Does patient have [facility] services? : Yes Does patient have [facility] services?: No  Past Medical History:  Past Medical History:  Diagnosis Date  . Allergy   . Asthma    As a child  . Chronic back pain greater than 3 months duration 2013  . Depression   . Fibromyalgia    History reviewed. No pertinent surgical history. Family History:  Family History  Problem Relation Age of Onset  . Alcohol abuse Mother   . Arthritis Mother   . Lupus Father   . Alcohol abuse Father   .  Heart disease Father   . Depression Father   . Heart disease Maternal Grandfather    Social History:  Social History   Substance and Sexual Activity  Alcohol Use No     Social History   Substance and Sexual Activity  Drug Use No    Social  History   Socioeconomic History  . Marital status: Single    Spouse name: None  . Number of children: None  . Years of education: None  . Highest education level: None  Social Needs  . Financial resource strain: None  . Food insecurity - worry: None  . Food insecurity - inability: None  . Transportation needs - medical: None  . Transportation needs - non-medical: None  Occupational History  . None  Tobacco Use  . Smoking status: Never Smoker  . Smokeless tobacco: Never Used  Substance and Sexual Activity  . Alcohol use: No  . Drug use: No  . Sexual activity: Yes    Birth control/protection: IUD  Other Topics Concern  . None  Social History Narrative  . None   Allergies:   Allergies  Allergen Reactions  . Amoxicillin Other (See Comments)    Has patient had a PCN reaction causing immediate rash, facial/tongue/throat swelling, SOB or lightheadedness with hypotension: Unknown Has patient had a PCN reaction causing severe rash involving mucus membranes or skin necrosis: No Has patient had a PCN reaction that required hospitalization: Unknown Has patient had a PCN reaction occurring within the last 10 years: No If all of the above answers are "NO", then may proceed with Cephalosporin use.   Marland Kitchen Penicillins Other (See Comments)    Has patient had a PCN reaction causing immediate rash, facial/tongue/throat swelling, SOB or lightheadedness with hypotension: Unknown Has patient had a PCN reaction causing severe rash involving mucus membranes or skin necrosis: No Has patient had a PCN reaction that required hospitalization: Unknown Has patient had a PCN reaction occurring within the last 10 years: No If all of the above answers are "NO", then may proceed with Cephalosporin use.     Labs:  Results for orders placed or performed during the hospital encounter of 12/12/17 (from the past 48 hour(s))  Comprehensive metabolic panel     Status: Abnormal   Collection Time: 12/13/17  6:36  PM  Result Value Ref Range   Sodium 141 135 - 145 mmol/L   Potassium 3.8 3.5 - 5.1 mmol/L   Chloride 101 101 - 111 mmol/L   CO2 28 22 - 32 mmol/L   Glucose, Bld 106 (H) 65 - 99 mg/dL   BUN 10 6 - 20 mg/dL   Creatinine, Ser 0.64 0.44 - 1.00 mg/dL   Calcium 10.4 (H) 8.9 - 10.3 mg/dL   Total Protein 8.5 (H) 6.5 - 8.1 g/dL   Albumin 5.1 (H) 3.5 - 5.0 g/dL   AST 24 15 - 41 U/L   ALT 23 14 - 54 U/L   Alkaline Phosphatase 77 38 - 126 U/L   Total Bilirubin 0.7 0.3 - 1.2 mg/dL   GFR calc non Af Amer >60 >60 mL/min   GFR calc Af Amer >60 >60 mL/min    Comment: (NOTE) The eGFR has been calculated using the CKD EPI equation. This calculation has not been validated in all clinical situations. eGFR's persistently <60 mL/min signify possible Chronic Kidney Disease.    Anion gap 12 5 - 15    Comment: Performed at [facility], [address] [place], [place]  [address]  Lipase, blood     Status: None   Collection Time: 12/13/17  6:36 PM  Result Value Ref Range   Lipase 20 11 - 51 U/L    Comment: Performed at [facility], [place] [address] [place], [place] [address]  CBC with Differential     Status: Abnormal   Collection Time: 12/13/17  6:36 PM  Result Value Ref Range   WBC 16.5 (H) 4.0 - 10.5 K/uL   RBC 4.92 3.87 - 5.11 MIL/uL   Hemoglobin 15.1 (H) 12.0 - 15.0 g/dL   HCT 43.1 36.0 - 46.0 %   MCV 87.6 78.0 - 100.0 fL   MCH 30.7 26.0 - 34.0 pg   MCHC 35.0 30.0 - 36.0 g/dL   RDW 12.4 11.5 - 15.5 %   Platelets 406 (H) 150 - 400 K/uL   Neutrophils Relative % 80 %   Neutro Abs 13.1 (H) 1.7 - 7.7 K/uL   Lymphocytes Relative 14 %   Lymphs Abs 2.3 0.7 - 4.0 K/uL   Monocytes Relative 6 %   Monocytes Absolute 1.1 (H) 0.1 - 1.0 K/uL   Eosinophils Relative 0 %   Eosinophils Absolute 0.0 0.0 - 0.7 K/uL   Basophils Relative 0 %   Basophils Absolute 0.0 0.0 - 0.1 K/uL    Comment: Performed at [facility], [place] [address] [place],  [place] [address]  I-Stat Troponin, ED (not at [facility])     Status: None   Collection Time: 12/13/17  7:07 PM  Result Value Ref Range   Troponin i, poc 0.00 0.00 - 0.08 ng/mL   Comment 3            Comment: Due to the release kinetics of cTnI, a negative result within the first hours of the onset of symptoms does not rule out myocardial infarction with certainty. If myocardial infarction is still suspected, repeat the test at appropriate intervals.   Urinalysis, Routine w reflex microscopic     Status: Abnormal   Collection Time: 12/13/17  7:24 PM  Result Value Ref Range   Color, Urine YELLOW YELLOW   APPearance CLEAR CLEAR   Specific Gravity, Urine 1.017 1.005 - 1.030   pH 8.0 5.0 - 8.0   Glucose, UA NEGATIVE NEGATIVE mg/dL   Hgb urine dipstick SMALL (A) NEGATIVE   Bilirubin Urine NEGATIVE NEGATIVE   Ketones, ur 80 (A) NEGATIVE mg/dL   Protein, ur NEGATIVE NEGATIVE mg/dL   Nitrite NEGATIVE NEGATIVE   Leukocytes, UA NEGATIVE NEGATIVE   RBC / HPF 0-5 0 - 5 RBC/hpf   WBC, UA 0-5 0 - 5 WBC/hpf   Bacteria, UA NONE SEEN NONE SEEN   Squamous Epithelial / LPF 0-5 (A) NONE SEEN   Budding Yeast PRESENT     Comment: Performed at [facility], [place] [address], [place], [place] [address]    Current Facility-Administered Medications  Medication Dose Route Frequency Provider Last Rate Last Dose  . acetaminophen (TYLENOL) tablet 650 mg  650 mg Oral Q4H PRN Duffy Bruce, MD      . alum & mag hydroxide-simeth (MAALOX/MYLANTA) 200-200-20 MG/5ML suspension 30 mL  30 mL Oral Q6H PRN Duffy Bruce, MD      . diphenhydrAMINE (BENADRYL) injection 50 mg  50 mg Intramuscular Once Ethelene Hal, NP      . hydrOXYzine (ATARAX/VISTARIL) tablet 50 mg  50 mg Oral TID Ethelene Hal, NP   50 mg at 12/15/17 1052  . LORazepam (  ATIVAN) injection 1 mg  1 mg Intramuscular Once Ethelene Hal, NP      . ondansetron [facility]) tablet 4 mg  4 mg Oral Q8H PRN Duffy Bruce, MD   4  mg at 12/15/17 0650  . traZODone (DESYREL) tablet 50 mg  50 mg Oral QHS Ethelene Hal, NP      . ziprasidone (GEODON) injection 10 mg  10 mg Intramuscular Once Ethelene Hal, NP       Current Outpatient Medications  Medication Sig Dispense Refill  . ibuprofen (ADVIL,MOTRIN) 200 MG tablet Take 600 mg by mouth every 6 (six) hours as needed for moderate pain.    . LamoTRIgine (LAMICTAL PO) Take by mouth as directed.      Musculoskeletal: Strength & Muscle Tone: within normal limits Gait & Station: normal Patient leans: N/A  Psychiatric Specialty Exam: Physical Exam  Nursing note and vitals reviewed. Constitutional: She is oriented to person, place, and time. She appears well-developed.  Patient looks ill and continues to verbalize nausea  HENT:  Head: Normocephalic and atraumatic.  Neck: Normal range of motion.  Respiratory: Effort normal.  Musculoskeletal: Normal range of motion.  Neurological: She is alert and oriented to person, place, and time. She has normal strength.  Skin: Skin is warm and dry.  Psychiatric: She has a normal mood and affect. Her speech is normal. Thought content normal. She is agitated. Cognition and memory are normal. She expresses impulsivity.    Review of Systems  Constitutional: Negative for chills and fever.  Respiratory: Positive for cough.   Gastrointestinal: Positive for nausea and vomiting.  Psychiatric/Behavioral: The patient does not have insomnia.   All other systems reviewed and are negative.   Blood pressure 113/69, pulse 62, temperature 98.5 F (36.9 C), temperature source Oral, resp. rate 18, last menstrual period 11/19/2017, SpO2 99 %.There is no height or weight on file to calculate BMI.  General Appearance: Fairly Groomed  Eye Contact:  Minimal  Speech:  Clear and Coherent  Volume:  Normal  Mood:  Irritable  Affect:  Congruent  Thought Process:  Coherent  Orientation:  Full (Time, Place, and Person)  Thought  Content:  Logical  Suicidal Thoughts:  No  Homicidal Thoughts:  No  Memory:  Immediate;   Good Recent;   Good Remote;   Good  Judgement:  Impaired  Insight:  Lacking  Psychomotor Activity:  Normal  Concentration:  Concentration: Good and Attention Span: Good  Recall:  Good  Fund of Knowledge:  Good  Language:  Good  Akathisia:  No  Handed:  Right  AIMS (if indicated):   N/A  Assets:  Agricultural consultant Housing Resilience Social Support  ADL's:  Intact  Cognition:  WNL  Sleep:   N/A     Treatment Plan Summary: Daily contact with patient to assess and evaluate symptoms and progress in treatment and Medication management (see MAR)  Disposition: Recommend psychiatric Inpatient admission when medically cleared.  TTS to seek placement. Ethelene Hal, NP 12/15/2017 2:28 PM   Patient seen face-to-face for psychiatric evaluation, chart reviewed and case discussed with the physician extender and developed treatment plan. Reviewed the information documented and agree with the treatment plan.  Buford Dresser, DO 12/15/17 5:35 PM

## 2017-12-15 NOTE — ED Notes (Signed)
[facility] [employer] [facility] for [facility]

## 2017-12-15 NOTE — ED Notes (Signed)
Patient seen with her significant other at bedside. Patient seems excited and stated to this writer "she is the best thing ever. I just realized that my mother is a stress to me and I wish not to see her here again". [facility]. Will continue to monitor patient.

## 2017-12-16 DIAGNOSIS — F17210 Nicotine dependence, cigarettes, uncomplicated: Secondary | ICD-10-CM

## 2017-12-16 DIAGNOSIS — F1290 Cannabis use, unspecified, uncomplicated: Secondary | ICD-10-CM

## 2017-12-16 DIAGNOSIS — F419 Anxiety disorder, unspecified: Secondary | ICD-10-CM

## 2017-12-16 DIAGNOSIS — R450 Nervousness: Secondary | ICD-10-CM

## 2017-12-16 DIAGNOSIS — F315 Bipolar disorder, current episode depressed, severe, with psychotic features: Principal | ICD-10-CM

## 2017-12-16 DIAGNOSIS — F1920 Other psychoactive substance dependence, uncomplicated: Secondary | ICD-10-CM

## 2017-12-16 DIAGNOSIS — Z630 Problems in relationship with spouse or partner: Secondary | ICD-10-CM

## 2017-12-16 DIAGNOSIS — Z811 Family history of alcohol abuse and dependence: Secondary | ICD-10-CM

## 2017-12-16 DIAGNOSIS — G4700 Insomnia, unspecified: Secondary | ICD-10-CM

## 2017-12-16 DIAGNOSIS — F319 Bipolar disorder, unspecified: Secondary | ICD-10-CM

## 2017-12-16 DIAGNOSIS — F1390 Sedative, hypnotic, or anxiolytic use, unspecified, uncomplicated: Secondary | ICD-10-CM

## 2017-12-16 DIAGNOSIS — R45851 Suicidal ideations: Secondary | ICD-10-CM

## 2017-12-16 DIAGNOSIS — Z818 Family history of other mental and behavioral disorders: Secondary | ICD-10-CM

## 2017-12-16 DIAGNOSIS — F1190 Opioid use, unspecified, uncomplicated: Secondary | ICD-10-CM

## 2017-12-16 MED ORDER — ARIPIPRAZOLE 10 MG PO TABS
10.0000 mg | ORAL_TABLET | Freq: Every day | ORAL | Status: DC
  Administered 2017-12-16 – 2017-12-19 (×4): 10 mg via ORAL
  Filled 2017-12-16 (×5): qty 1

## 2017-12-16 MED ORDER — HYDROXYZINE HCL 50 MG PO TABS
50.0000 mg | ORAL_TABLET | Freq: Four times a day (QID) | ORAL | Status: DC | PRN
  Administered 2017-12-16 – 2017-12-19 (×4): 50 mg via ORAL
  Filled 2017-12-16 (×2): qty 1
  Filled 2017-12-16: qty 10

## 2017-12-16 MED ORDER — GABAPENTIN 300 MG PO CAPS
300.0000 mg | ORAL_CAPSULE | Freq: Three times a day (TID) | ORAL | Status: DC
  Administered 2017-12-16 – 2017-12-19 (×9): 300 mg via ORAL
  Filled 2017-12-16 (×11): qty 1
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
## 2017-12-16 NOTE — [facility] Suicide Risk Assessment (Signed)
[facility]   Nursing information obtained from:    Demographic factors:    Current Mental Status:    Loss Factors:    Historical Factors:    Risk Reduction Factors:     Total Time spent with patient: 1 hour Principal Problem: Bipolar I disorder, current or most recent episode depressed, with psychotic features [facility](HCC) Diagnosis:   Patient Active Problem List   Diagnosis Date Noted  . Bipolar I disorder, current or most recent episode depressed, with psychotic features (HCC) [F31.5] 12/16/2017  . Polysubstance (including opioids) dependence, daily use (HCC) [F19.20] 12/15/2017  . Substance induced mood disorder (HCC) [F19.94] 12/11/2017  . Polysubstance dependence including opioid type drug, episodic abuse (HCC) [F11.20, F19.20] 12/11/2017  . Breast lump on left side at 6 o'clock position [N63.20] 06/29/2013  . Breast lump on right side at 3 o'clock position [N63.10] 06/29/2013  . Cat allergies [J30.81] 05/25/2012  . Family history of systemic lupus erythematosus [Z82.69] 04/13/2012  . Back pain [M54.9] 04/13/2012  . Pleuritic chest pain [R07.81] 04/13/2012  . Shin splints [I69.629B][S86.899A] 04/13/2012   Subjective Data: see H&P for details  Continued Clinical Symptoms:  Alcohol Use Disorder Identification Test Final Score (AUDIT): 3 The "Alcohol Use Disorders Identification Test", Guidelines for Use in Primary Care, Second Edition.  World Science writerHealth Organization [facility](WHO). Score between 0-7:  no or low risk or alcohol related problems. Score between 8-15:  moderate risk of alcohol related problems. Score between 16-19:  high risk of alcohol related problems. Score 20 or above:  warrants further diagnostic evaluation for alcohol dependence and treatment.   CLINICAL FACTORS:   Severe Anxiety and/or Agitation Bipolar Disorder:   Mixed State Alcohol/Substance Abuse/Dependencies More than one psychiatric diagnosis Unstable or Poor Therapeutic Relationship Previous  Psychiatric Diagnoses and Treatments  Psychiatric Specialty Exam: Physical Exam  Nursing note and vitals reviewed.   ROS - see H&P for details  Blood pressure 116/77, pulse 64, temperature 98.6 F (37 C), temperature source Oral, resp. rate 18, height 5\' 1"  (1.549 m), weight 53.5 kg (118 lb), last menstrual period 11/19/2017.Body mass index is 22.3 kg/m.      COGNITIVE FEATURES THAT CONTRIBUTE TO RISK:  None    SUICIDE RISK:   Minimal: No identifiable suicidal ideation.  Patients presenting with no risk factors but with morbid ruminations; may be classified as minimal risk based on the severity of the depressive symptoms  PLAN OF CARE: see H&P for details  I certify that inpatient services furnished can reasonably be expected to improve the patient's condition.   Micheal Likenshristopher T Yarethzi Branan, MD 12/16/2017, 4:08 PM
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
## 2017-12-16 NOTE — Progress Notes (Signed)
Recreation Therapy Notes  INPATIENT RECREATION THERAPY ASSESSMENT  Patient Details Name: Soyla MurphyLaura M Pettway MRN: [id number] DOB: 02-13-1986 Today's Date: 12/16/2017       Information Obtained From: Patient  Able to Participate in Assessment/Interview: Yes  Patient Presentation: Alert, Oriented  Reason for Admission (Per Patient): Patient reported being admitted into the hospital IVC. Patient reports that she thinks that her mother is the reason why she is here.   Patient Stressors: Family  Coping Skills:   Film/video editorsolation, Write, Music, Art, Talk, Deep Breathing, Hot Bath/Shower, Dance, Meditate, Substance Abuse  Leisure Interests (2+):  Individual - Napping, Social - Family  Frequency of Recreation/Participation: Weekly  Awareness of Community Resources:  Yes  Current Use: No  Expressed Interest in [employer] CorporationCommunity Resource Information: No  County of Residence:  [place]   Patient Main Form of Transportation: Car  Patient Strengths:  Communicating and trying to fix things   Patient Identified Areas of Improvement:  Being more strong   Patient Goal for Hospitalization:  Continue to move foward   Current SI (including self-harm):  No  Current HI:  No  Current AVH: No  Staff Intervention Plan: Group Attendance  Consent to Intern Participation: Yes  Sheryle Hailarian Chizara Mena, Recreation Therapy Intern   Sheryle HailDarian Lari Linson 12/16/2017, 1:40 PM

## 2017-12-16 NOTE — Progress Notes (Signed)
D: Pt denies SI/HI/AVH. Pt has no insight into Tx at this time, pt is bizarre and paranoid at times. Pt labile on the unit, pt gets angry and is argumentative , then she appears calm. Pt was moved to her own room and made no-roommate due to this instability and not being to tolerate a roommate at this time, will continue to reevaluate.  A: Pt was offered support and encouragement. Pt was given scheduled medications. Pt was encourage to attend groups. Q 15 minute checks were done for safety.  R: safety maintained on unit.

## 2017-12-16 NOTE — Plan of Care (Signed)
  Activity: Sleeping patterns will improve 12/16/2017 2031 - Progressing by Delos HaringPhillips, Moreen Piggott A, RN Note Pt slept over 5 hrs last night

## 2017-12-16 NOTE — Progress Notes (Signed)
Adult Psychoeducational Group Note  Date:  12/16/2017 Time:  8:10 PM  Group Topic/Focus:  Wrap-Up Group:   The focus of this group is to help patients review their daily goal of treatment and discuss progress on daily workbooks.  Participation Level:  Active  Participation Quality:  Appropriate  Affect:  Appropriate  Cognitive:  Appropriate  Insight: Appropriate  Engagement in Group:  Engaged  Modes of Intervention:  Discussion  Additional Comments: The patient expressed that she rates today a 10.The patient also said that she felt like the Community group was encouraging  for her.  Octavio Mannshigpen, Johnross Nabozny Lee 12/16/2017, 8:10 PM

## 2017-12-16 NOTE — Progress Notes (Signed)
Patient denies SI, HI and AVH.  Patient has been noted on the unit engaged in unit activities and groups.  Patient was compliant with all medications and treatments.  Patient has had no incidents of behavioral dyscontrol and has had minimal need for staff redirection.   Assess patient for safety, offer medications as prescribed engage patient in 1:1 staff talks.   Patient able to contract for safety. Continue to monitor as prescribed.  

## 2017-12-16 NOTE — BHH Group Notes (Signed)
LCSW Group Therapy Notes 12/16/2017 1:15pm Type of Therapy and Topic:  Group Therapy:  Communication Participation Level:  Active  Description of Group: Patients will identify how individuals communicate with one another appropriately and inappropriately.  Patients will be guided to discuss their thoughts, feelings and behaviors related to barriers when communicating.  The group will process together ways to execute positive and appropriate communication with attention given to how one uses behavior, tone and body language.  Patients will be encouraged to reflect on a situation where they were successfully able to communicate and what made this example successful.  Group will identify specific changes they are motivated to make in order to overcome communication barriers with self, peers, authority, and parents.  This group will be process-oriented with patients participating in exploration of their own experiences, giving and receiving support, and challenging self and other group members.   Therapeutic Goals 1. Patient will identify how people communicate (body language, facial expression, and electronics).  Group will also discuss tone, voice and how these impact what is communicated and what is received. 2. Patient will identify feelings (such as fear or worry), thought process and behaviors related to why people internalize feelings rather than express self openly. 3. Patient will identify two changes they are willing to make to overcome communication barriers 4. Members will then practice through role play how to communicate using I statements, I feel statements, and acknowledging feelings rather than displacing feelings on others Summary of Patient Progress:  Stayed the entire time, engaged throughout.  At one point advocated for another patient to others and asked them to be patient with her-pointed out that she has some processing issues that affect her.  Talked about having good supports  outside of here, but also how she gets caught up in caring for others [as evidenced by her intervention in group] and ignores her own needs.  "I don't want to do that anymore or else I could end up in the hospital again." Therapeutic Modalities Cognitive Behavioral Therapy Motivational Interviewing Solution Focused Therapy  Sarah RogueRodney B Keina Mutch, LCSW 12/16/2017 2:20 PM

## 2017-12-17 DIAGNOSIS — Z915 Personal history of self-harm: Secondary | ICD-10-CM

## 2017-12-17 DIAGNOSIS — R440 Auditory hallucinations: Secondary | ICD-10-CM

## 2017-12-17 DIAGNOSIS — R441 Visual hallucinations: Secondary | ICD-10-CM

## 2017-12-17 LAB — LIPID PANEL
CHOLESTEROL: 128 mg/dL (ref 0–200)
HDL: 44 mg/dL (ref >40)
LDL Cholesterol: 69 mg/dL (ref 0–99)
Total CHOL/HDL Ratio: 2.9 RATIO
Triglycerides: 76 mg/dL (ref <150)
VLDL: 15 mg/dL (ref 0–40)

## 2017-12-17 LAB — HEMOGLOBIN A1C
HEMOGLOBIN A1C: 5 % (ref 4.8–5.6)
MEAN PLASMA GLUCOSE: 96.8 mg/dL

## 2017-12-17 LAB — TSH: TSH: 1.384 u[IU]/mL (ref 0.350–4.500)

## 2017-12-17 NOTE — Tx Team (Signed)
Interdisciplinary Treatment and Diagnostic Plan Update  12/17/2017 Time of Session: 8:26 AM  Sarah Mcclure MRN: [id number]  Principal Diagnosis: Bipolar I disorder, current or most recent episode depressed, with psychotic features ([place])  Secondary Diagnoses: Principal Problem:   Bipolar I disorder, current or most recent episode depressed, with psychotic features ([place]) Active Problems:   Polysubstance (including opioids) dependence, daily use ([place])   Current Medications:  Current Facility-Administered Medications  Medication Dose Route Frequency Provider Last Rate Last Dose  . acetaminophen (TYLENOL) tablet 650 mg  650 mg Oral Q6H PRN Ethelene Hal, NP      . alum & mag hydroxide-simeth (MAALOX/MYLANTA) 200-200-20 MG/5ML suspension 30 mL  30 mL Oral Q4H PRN Ethelene Hal, NP   30 mL at 12/16/17 [phone]  . ARIPiprazole (ABILIFY) tablet 10 mg  10 mg Oral Daily Pennelope Bracken, MD   10 mg at 12/17/17 0759  . cloNIDine (CATAPRES) tablet 0.1 mg  0.1 mg Oral QID Patriciaann Clan E, PA-C   0.1 mg at 12/17/17 0759   Followed by  . [START ON 12/18/2017] cloNIDine (CATAPRES) tablet 0.1 mg  0.1 mg Oral BH-qamhs Simon, Spencer E, PA-C       Followed by  . [START ON 12/20/2017] cloNIDine (CATAPRES) tablet 0.1 mg  0.1 mg Oral QAC breakfast Laverle Hobby, PA-C      . dicyclomine (BENTYL) tablet 20 mg  20 mg Oral Q6H PRN Patriciaann Clan E, PA-C      . gabapentin (NEURONTIN) capsule 300 mg  300 mg Oral TID Pennelope Bracken, MD   300 mg at 12/17/17 0759  . hydrOXYzine (ATARAX/VISTARIL) tablet 50 mg  50 mg Oral Q6H PRN Pennelope Bracken, MD   50 mg at 12/16/17 2121  . loperamide (IMODIUM) capsule 2-4 mg  2-4 mg Oral PRN Laverle Hobby, PA-C      . ziprasidone (GEODON) injection 20 mg  20 mg Intramuscular Q12H PRN Ethelene Hal, NP       And  . LORazepam (ATIVAN) tablet 1 mg  1 mg Oral PRN Ethelene Hal, NP      . magnesium hydroxide (MILK OF MAGNESIA)  suspension 30 mL  30 mL Oral Daily PRN Ethelene Hal, NP      . methocarbamol (ROBAXIN) tablet 500 mg  500 mg Oral Q8H PRN Laverle Hobby, PA-C   500 mg at 12/17/17 0119  . naproxen (NAPROSYN) tablet 500 mg  500 mg Oral BID PRN Laverle Hobby, PA-C   500 mg at 12/17/17 1740  . ondansetron (ZOFRAN) tablet 4 mg  4 mg Oral Q8H PRN Ethelene Hal, NP      . traZODone (DESYREL) tablet 50 mg  50 mg Oral QHS Ethelene Hal, NP   50 mg at 12/17/17 0119    PTA Medications: Medications Prior to Admission  Medication Sig Dispense Refill Last Dose  . ibuprofen (ADVIL,MOTRIN) 200 MG tablet Take 600 mg by mouth every 6 (six) hours as needed for moderate pain.   Past Week at Unknown time  . LamoTRIgine (LAMICTAL PO) Take by mouth as directed.   Past Month at Unknown time    Patient Stressors: Marital or family conflict Medication change or noncompliance Substance abuse  Patient Strengths: Ability for insight Average or above average intelligence Capable of independent living General fund of knowledge Motivation for treatment/growth  Treatment Modalities: Medication Management, Group therapy, Case management,  1 to 1 session with clinician, Psychoeducation, Recreational  therapy.   Physician Treatment Plan for Primary Diagnosis: Bipolar I disorder, current or most recent episode depressed, with psychotic features ([place]) Long Term Goal(s): Improvement in symptoms so as ready for discharge  Short Term Goals: Ability to identify triggers associated with substance abuse/mental health issues will improve Ability to demonstrate self-control will improve  Medication Management: Evaluate patient's response, side effects, and tolerance of medication regimen.  Therapeutic Interventions: 1 to 1 sessions, Unit Group sessions and Medication administration.  Evaluation of Outcomes: Progressing  Physician Treatment Plan for Secondary Diagnosis: Principal Problem:   Bipolar I  disorder, current or most recent episode depressed, with psychotic features ([place]) Active Problems:   Polysubstance (including opioids) dependence, daily use ([place])   Long Term Goal(s): Improvement in symptoms so as ready for discharge  Short Term Goals: Ability to identify triggers associated with substance abuse/mental health issues will improve Ability to demonstrate self-control will improve  Medication Management: Evaluate patient's response, side effects, and tolerance of medication regimen.  Therapeutic Interventions: 1 to 1 sessions, Unit Group sessions and Medication administration.  Evaluation of Outcomes: Progressing   RN Treatment Plan for Primary Diagnosis: Bipolar I disorder, current or most recent episode depressed, with psychotic features ([place]) Long Term Goal(s): Knowledge of disease and therapeutic regimen to maintain health will improve  Short Term Goals: Ability to demonstrate self-control and Compliance with prescribed medications will improve  Medication Management: RN will administer medications as ordered by provider, will assess and evaluate patient's response and provide education to patient for prescribed medication. RN will report any adverse and/or side effects to prescribing provider.  Therapeutic Interventions: 1 on 1 counseling sessions, Psychoeducation, Medication administration, Evaluate responses to treatment, Monitor vital signs and CBGs as ordered, Perform/monitor CIWA, COWS, AIMS and Fall Risk screenings as ordered, Perform wound care treatments as ordered.  Evaluation of Outcomes: Progressing   LCSW Treatment Plan for Primary Diagnosis: Bipolar I disorder, current or most recent episode depressed, with psychotic features ([place]) Long Term Goal(s): Safe transition to appropriate next level of care at discharge, Engage patient in therapeutic group addressing interpersonal concerns.  Short Term Goals: Engage patient in aftercare planning with referrals  and resources  Therapeutic Interventions: Assess for all discharge needs, 1 to 1 time with Social worker, Explore available resources and support systems, Assess for adequacy in community support network, Educate family and significant other(s) on suicide prevention, Complete Psychosocial Assessment, Interpersonal group therapy.  Evaluation of Outcomes: Met  Return home, follow up [facility]   Progress in Treatment: Attending groups: Yes Participating in groups: Yes Taking medication as prescribed: Yes Toleration medication: Yes, no side effects reported at this time Family/Significant other contact made: No Patient understands diagnosis: Yes AEB asking for help with her mental health issues Discussing patient identified problems/goals with staff: Yes Medical problems stabilized or resolved: Yes Denies suicidal/homicidal ideation: Yes Issues/concerns per patient self-inventory: None Other: N/A  New problem(s) identified: None identified at this time.   New Short Term/Long Term Goal(s): "I want help with getting better, being more stable, and being of sound mind."   Discharge Plan or Barriers:   Reason for Continuation of Hospitalization:  [place]ia  Medication stabilization   Estimated Length of Stay: 3/11  Attendees: Patient: Sarah Mcclure 12/17/2017  8:26 AM  Physician: Maris Berger, MD 12/17/2017  8:26 AM  Nursing: Sena Hitch, RN 12/17/2017  8:26 AM  RN Care Manager: Lars Pinks, RN 12/17/2017  8:26 AM  Social Worker: Ripley Fraise 12/17/2017  8:26 AM  Recreational Therapist:  Winfield Cunas 12/17/2017  8:26 AM  Other: Norberto Sorenson 12/17/2017  8:26 AM  Other:  12/17/2017  8:26 AM    Scribe for Treatment Team:  Roque Lias LCSW 12/17/2017 8:26 AM

## 2017-12-17 NOTE — Progress Notes (Signed)
Adult Psychoeducational Group Note  Date:  12/17/2017 Time:  9:04 PM  Group Topic/Focus:  Wrap-Up Group:   The focus of this group is to help patients review their daily goal of treatment and discuss progress on daily workbooks.  Participation Level:  Active  Participation Quality:  Appropriate  Affect:  Appropriate  Cognitive:  Appropriate  Insight: Good  Engagement in Group:  Developing/Improving and Supportive  Modes of Intervention:  Activity  Additional Comments:  Patient attended group the entire time.  Lyndee HensenGoins, Aniayah Alaniz R 12/17/2017, 9:04 PMAdult Psychoeducational Group Note

## 2017-12-17 NOTE — [facility] Suicide Risk Assessment (Signed)
[facility] INPATIENT:  Family/Significant Other Suicide Prevention Education  Suicide Prevention Education:  Contact Attempts: Sherryl Mangeslyssa Richardson, fiancee, [phone],has been identified by the patient as the family member/significant other with whom the patient will be residing, and identified as the person(s) who will aid the patient in the event of a mental health crisis.  With written consent from the patient, two attempts were made to provide suicide prevention education, prior to and/or following the patient's discharge.  We were unsuccessful in providing suicide prevention education.  A suicide education pamphlet was given to the patient to share with family/significant other.  Date and time of first attempt:12/17/17, 0830 Date and time of second attempt:  Lorri FrederickWierda, Sarah Mcclure Jon, LCSW 12/17/2017, 8:31 AM

## 2017-12-17 NOTE — Progress Notes (Signed)
DAR NOTE: Patient presents with anxious affect with irritable mood during assessment.  Denies suicidal thoughts, auditory and visual hallucinations.  Described energy level as normal and concentration as good.  Reports withdrawal symptoms of cravings, chilling, cramping, nausea, agitation, and runny nose on self inventory form.  Rates depression at 5, hopelessness at 0, and anxiety at 3.  Maintained on routine safety checks.  Medications given as prescribed.  Support and encouragement offered as needed.  Attended group and participated.  States goal for today is "being mindful and making a plan for outside."  Patient observed socializing with peers in the dayroom.  Patient is safe on the unit.

## 2017-12-17 NOTE — Progress Notes (Signed)
[facility] MD Progress Note  12/17/2017 2:58 PM [name]  MRN:  [id number]  Subjective: Sarah Mcclure reports, "I was having issues with my girlfriend that led to our separation. I was upset, got stressed out, gotten into some drugs, smoking pots for some relief. I may have gotten a bad weed, had a bad psychotic break. I was also throwing -up, having hot/cold flashes & bad anxiety. I was brought to the hospital. Today, I'm doing better, feeling really good. 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 Since being in this hospital, I'm taking this new medicine, Abilify. I feel a little blurry earlier today. But, better now. I slept well last night. I have been attending group sessions. I would want to continue on the current dose of the Abilify as it is now. I'm not sure if I'm ready to titrate it up yet. I will continue as it is. I'm feeling clear in my mind as well. Planning on going home to my sister's home after discharge. She just had twin babies, will surely use my help".  Sarah Mcclure is a 32 y/o F with history of bipolar disorder and polysubstance abuse who was admitted on IVC from ED where she had self presented with complaints of worsening depression, SI with plan to strangle herself, AH/VH of seeing the devil, and illicit substance use. Pt has recent relevant history of presentation to ED on 12/11/17 with symptoms of mania and substance use, and she was discharged with resources for substance use treatment. Pt reported in ED that she had used a gram of an unknown substance after her last discharge from the ED and her symptoms had worsened in terms of her mood symptoms in addition to feeling that she was withdrawing from the unknown substance. UDS was positive for benzodiazepines, opiates, and THC. Today, Sarah Mcclure is seen, chart reviewed. The chart findings reviewed with the treatment team. She presents alert, oriented & aware of situation. She is visible on the unit,  attending group sessions. She has agreed to continue current treatment plan already in progress. Other than feeling a little blurry earlier today, she says she is tolerating her treatment regimen. We are continuing her treatment to stabilization.  Principal Problem: Bipolar I disorder, current or most recent episode depressed, with psychotic features [facility])  Diagnosis:   Patient Active Problem List   Diagnosis Date Noted  . Bipolar I disorder, current or most recent episode depressed, with psychotic features ([facility]) [F31.5] 12/16/2017  . Polysubstance (including opioids) dependence, daily use (H[facility]) [F19.20] 12/15/2017  . Substance induced mood disorder (HCC) [F19.94] 12/11/2017  . Polysubstance dependence including opioid type drug, episodic abuse (HCC) [F11.20, F19.20] 12/11/2017  . Breast lump on left side at 6 o'clock position [N63.20] 06/29/2013  . Breast lump on right side at 3 o'clock position [N63.10] 06/29/2013  . Cat allergies [J30.81] 05/25/2012  . Family history of systemic lupus erythematosus [Z82.69] 04/13/2012  . Back pain [M54.9] 04/13/2012  . Pleuritic chest pain [R07.81] 04/13/2012  . Shin splints [W09.811B] 04/13/2012   Total Time spent with patient: 30 minutes  Past Psychiatric History: See H&P.  Past Medical History:  Past Medical History:  Diagnosis Date  . Allergy   . Asthma    As a child  . Chronic back pain greater than 3 months duration 2013  . Depression   . Fibromyalgia    History reviewed. No pertinent surgical history.  Family History:  Family History  Problem Relation Age of  Onset  . Alcohol abuse Mother   . Arthritis Mother   . Lupus Father   . Alcohol abuse Father   . Heart disease Father   . Depression Father   . Heart disease Maternal Grandfather    Family Psychiatric  History: See H&P.  Social History:  Social History   Substance and Sexual Activity  Alcohol Use No     Social History   Substance and Sexual Activity  Drug Use Yes   . Types: Marijuana, Other-see comments   Comment: molly    Social History   Socioeconomic History  . Marital status: Single    Spouse name: None  . Number of children: None  . Years of education: None  . Highest education level: None  Social Needs  . Financial resource strain: None  . Food insecurity - worry: None  . Food insecurity - inability: None  . Transportation needs - medical: None  . Transportation needs - non-medical: None  Occupational History  . None  Tobacco Use  . Smoking status: Current Some Day Smoker    Types: Cigarettes  . Smokeless tobacco: Never Used  Substance and Sexual Activity  . Alcohol use: No  . Drug use: Yes    Types: Marijuana, Other-see comments    Comment: molly  . Sexual activity: Yes    Birth control/protection: IUD  Other Topics Concern  . None  Social History Narrative  . None   Additional Social History:   Sleep: Good  Appetite:  Good  Current Medications: Current Facility-Administered Medications  Medication Dose Route Frequency Provider Last Rate Last Dose  . acetaminophen (TYLENOL) tablet 650 mg  650 mg Oral Q6H PRN Laveda Abbe, NP      . alum & mag hydroxide-simeth (MAALOX/MYLANTA) 200-200-20 MG/5ML suspension 30 mL  30 mL Oral Q4H PRN Laveda Abbe, NP   30 mL at 12/16/17 [phone]  . ARIPiprazole (ABILIFY) tablet 10 mg  10 mg Oral Daily Micheal Likens, MD   10 mg at 12/17/17 0759  . cloNIDine (CATAPRES) tablet 0.1 mg  0.1 mg Oral QID Kerry Hough, PA-C   0.1 mg at 12/17/17 1138   Followed by  . [START ON 12/18/2017] cloNIDine (CATAPRES) tablet 0.1 mg  0.1 mg Oral BH-qamhs Simon, Spencer E, PA-C       Followed by  . [START ON 12/20/2017] cloNIDine (CATAPRES) tablet 0.1 mg  0.1 mg Oral QAC breakfast Kerry Hough, PA-C      . dicyclomine (BENTYL) tablet 20 mg  20 mg Oral Q6H PRN Donell Sievert E, PA-C      . gabapentin (NEURONTIN) capsule 300 mg  300 mg Oral TID Micheal Likens, MD   300  mg at 12/17/17 1138  . hydrOXYzine (ATARAX/VISTARIL) tablet 50 mg  50 mg Oral Q6H PRN Micheal Likens, MD   50 mg at 12/16/17 2121  . loperamide (IMODIUM) capsule 2-4 mg  2-4 mg Oral PRN Kerry Hough, PA-C      . ziprasidone (GEODON) injection 20 mg  20 mg Intramuscular Q12H PRN Laveda Abbe, NP       And  . LORazepam (ATIVAN) tablet 1 mg  1 mg Oral PRN Laveda Abbe, NP      . magnesium hydroxide (MILK OF MAGNESIA) suspension 30 mL  30 mL Oral Daily PRN Laveda Abbe, NP      . methocarbamol (ROBAXIN) tablet 500 mg  500 mg Oral Q8H PRN Kerry Hough,  PA-C   500 mg at 12/17/17 0119  . naproxen (NAPROSYN) tablet 500 mg  500 mg Oral BID PRN Kerry Hough, PA-C   500 mg at 12/17/17 4098  . ondansetron (ZOFRAN) tablet 4 mg  4 mg Oral Q8H PRN Laveda Abbe, NP      . traZODone (DESYREL) tablet 50 mg  50 mg Oral QHS Laveda Abbe, NP   50 mg at 12/17/17 0119   Lab Results:  Results for orders placed or performed during the hospital encounter of 12/15/17 (from the past 48 hour(s))  Lipid panel     Status: None   Collection Time: 12/17/17  7:32 AM  Result Value Ref Range   Cholesterol 128 0 - 200 mg/dL   Triglycerides 76 <119 mg/dL   HDL 44 >14 mg/dL   Total CHOL/HDL Ratio 2.9 RATIO   VLDL 15 0 - 40 mg/dL   LDL Cholesterol 69 0 - 99 mg/dL    Comment:        Total Cholesterol/HDL:CHD Risk Coronary Heart Disease Risk Table                     Men   Women  1/2 Average Risk   3.4   3.3  Average Risk       5.0   4.4  2 X Average Risk   9.6   7.1  3 X Average Risk  23.4   11.0        Use the calculated Patient Ratio above and the CHD Risk Table to determine the patient's CHD Risk.        ATP III CLASSIFICATION (LDL):  <100     mg/dL   Optimal  782-956  mg/dL   Near or Above                    Optimal  130-159  mg/dL   Borderline  213-086  mg/dL   High  >578     mg/dL   Very High Performed at [facility], [address], [place], [place] [address]   TSH     Status: None   Collection Time: 12/17/17  7:32 AM  Result Value Ref Range   TSH 1.384 0.350 - 4.500 uIU/mL    Comment: Performed by a 3rd Generation assay with a functional sensitivity of <=0.01 uIU/mL. Performed at [facility], [address] [place], [place] [address]   Hemoglobin A1c     Status: None   Collection Time: 12/17/17  7:32 AM  Result Value Ref Range   Hgb A1c MFr Bld 5.0 4.8 - 5.6 %    Comment: (NOTE) Pre diabetes:          5.7%-6.4% Diabetes:              >6.4% Glycemic control for   <7.0% adults with diabetes    Mean Plasma Glucose 96.8 mg/dL    Comment: Performed at [facility], [address], [place], [place] [address]   Blood Alcohol level:  Lab Results  Component Value Date   ETH <10 12/12/2017   ETH <10 12/11/2017   Metabolic Disorder Labs: Lab Results  Component Value Date   HGBA1C 5.0 12/17/2017   MPG 96.8 12/17/2017   No results found for: PROLACTIN Lab Results  Component Value Date   CHOL 128 12/17/2017   TRIG 76 12/17/2017   HDL 44 12/17/2017   CHOLHDL 2.9 12/17/2017  VLDL 15 12/17/2017   LDLCALC 69 12/17/2017   Physical Findings: AIMS: Facial and Oral Movements Muscles of Facial Expression: None, normal Lips and Perioral Area: None, normal Jaw: None, normal Tongue: None, normal,Extremity Movements Upper (arms, wrists, hands, fingers): None, normal Lower (legs, knees, ankles, toes): None, normal, Trunk Movements Neck, shoulders, hips: None, normal, Overall Severity Severity of abnormal movements (highest score from questions above): None, normal Incapacitation due to abnormal movements: None, normal Patient's awareness of abnormal movements (rate only patient's report): No Awareness, Dental Status Current problems with teeth and/or dentures?: No Does patient usually wear dentures?: No  CIWA:    COWS:  COWS Total Score: 0  Musculoskeletal: Strength &  Muscle Tone: within normal limits Gait & Station: normal Patient leans: N/A  Psychiatric Specialty Exam: Physical Exam  Nursing note and vitals reviewed.   Review of Systems  Psychiatric/Behavioral: Positive for depression ("Improving"), hallucinations (Hx. Psychosis) and substance abuse (Hx. Polysubstance use disorder). Negative for memory loss and suicidal ideas. The patient is not nervous/anxious and does not have insomnia.     Blood pressure 116/66, pulse (!) 56, temperature 98.6 F (37 C), temperature source Oral, resp. rate 18, height 5\' 1"  (1.549 m), weight 53.5 kg (118 lb), last menstrual period 11/19/2017.Body mass index is 22.3 kg/m.  General Appearance: Casual and Fairly Groomed  Eye Contact:  Good  Speech:  Clear and Coherent and Normal Rate  Volume:  Normal  Mood:  Anxious  Affect:  Appropriate, Congruent and Constricted  Thought Process:  Coherent and Goal Directed  Orientation:  Full (Time, Place, and Person)  Thought Content:  Logical  Suicidal Thoughts:  No  Homicidal Thoughts:  No  Memory:  Immediate;   Fair Recent;   Fair Remote;   Fair  Judgement:  Impaired  Insight:  Lacking  Psychomotor Activity:  Normal  Concentration:  Concentration: Fair  Recall:  FiservFair  Fund of Knowledge:  Fair  Language:  Fair  Akathisia:  No  Handed:    AIMS (if indicated):     Assets:  Resilience  ADL's:  Intact  Cognition:  WNL  Sleep:  Number of Hours: 6.0     Treatment Plan Summary: Daily contact with patient to assess and evaluate symptoms and progress in treatment: Continue inpatient hospitalization.    -  Will continue today 12/17/2017 plan as below except where it is noted.  Mood control.    - Continue Abilify 10 mg po daily.  Opioid detox.    - Continue the Clonidine detox protocols already in progress.  Agitation.    - Continue Gaabpentin 300 mg po tid.  Anxiety.    - Continue Hydroxyzine 50 mg po Q 6 hours prn.  Agitation/psychosis.    - Continue the  agitation protocols with Geodon 20 mg & Lorazepam 2 mg IM Q 6 hours prn.        - patient to participate in the group counseling session.    - Discharge plan on going.  Armandina StammerAgnes Ethelmae Ringel, NP, PMHNP, FNP-BC. 12/17/2017, 2:58 PM

## 2017-12-17 NOTE — Progress Notes (Signed)
Nursing Progress Note: 7p-7a D: Pt currently presents with a pleasant/anxious affect and behavior. Pt states "I am ready to go. I signed a 72 hour today. I don't feel like I am withdrawaling at all now." Interacting appropriately with the milieu. Pt reports fair sleep during the previous night with current medication regimen. Pt did attend wrap-up group.  A: Pt provided with medications per providers orders. Pt's labs and vitals were monitored throughout the night. Pt supported emotionally and encouraged to express concerns and questions. Pt educated on medications.  R: Pt's safety ensured with 15 minute and environmental checks. Pt currently denies SI, HI, and AVH. Pt verbally contracts to seek staff if SI,HI, or AVH occurs and to consult with staff before acting on any harmful thoughts. Will continue to monitor.

## 2017-12-17 NOTE — Plan of Care (Signed)
Patient is safe and free from injury.  Routine safety checks maintained every 15 minutes. 

## 2017-12-17 NOTE — Progress Notes (Signed)
Recreation Therapy Notes  Date: 12/17/17 Time: 0950 Location: [address] Dayroom  Group Topic: Wellness  Goal Area(s) Addresses:  Patient will define components of whole wellness. Patient will verbalize benefit of whole wellness.  Behavioral Response: Engaged  Intervention: Music, Exercise  Activity: Exercise.  LRT played music while leading patients through a series of exercises.  LRT lead patients through a round of stretches, then allowed each patient to lead an exercise of their choosing such as (squats, jumping jacks, jogging in place, etc).  LRT closed out group by leading patients through a cool down where they completed more stretches.  Education: Wellness, Building control surveyorDischarge Planning.   Education Outcome: Acknowledges education/In group clarification offered/Needs additional education.   Clinical Observations/Feedback:  Pt stated exercise helps to release endorphines.  Pt also expressed many people don't exercise because "people work 8-10 hours and don't have time for exercise".  Pt was social engaged with peers and fully active during group.    Caroll RancherMarjette Damoney Julia, LRT/CTRS     Lillia AbedLindsay, Rochell Mabie A 12/17/2017 11:04 AM

## 2017-12-17 NOTE — [facility] Group Notes (Signed)
LCSW Group Therapy Note  12/17/2017 1:15pm  Type of Therapy/Topic:  Group Therapy:  Balance in Life  Participation Level:  Active  Description of Group:    This group will address the concept of balance and how it feels and looks when one is unbalanced. Patients will be encouraged to process areas in their lives that are out of balance and identify reasons for remaining unbalanced. Facilitators will guide patients in utilizing problem-solving interventions to address and correct the stressor making their life unbalanced. Understanding and applying boundaries will be explored and addressed for obtaining and maintaining a balanced life. Patients will be encouraged to explore ways to assertively make their unbalanced needs known to significant others in their lives, using other group members and facilitator for support and feedback.  Therapeutic Goals: 1. Patient will identify two or more emotions or situations they have that consume much of in their lives. 2. Patient will identify signs/triggers that life has become out of balance:  3. Patient will identify two ways to set boundaries in order to achieve balance in their lives:  4. Patient will demonstrate ability to communicate their needs through discussion and/or role plays  Summary of Patient Progress:  Stayed the entire time, engaged throughout. Started out intrusive, tangential.  By the end she was focused, still a bit intrusive.  Switched when she was talking about her children, and how she is trying to be a good example to them by taking care of eher emotions, and removing herself from situations when she feels overwhelmed.  Gave the example of lunch today when she asked to return to the unit.  Gave demonstration of grounding that she learned from a therapist several years back.    Therapeutic Modalities:   Cognitive Behavioral Therapy Solution-Focused Therapy Assertiveness Training  Ida RogueRodney B Ayame Rena, [place] 12/17/2017 3:16 PM

## 2017-12-18 LAB — PROLACTIN: Prolactin: 18.8 ng/mL (ref 4.8–23.3)

## 2017-12-18 MED ORDER — NICOTINE POLACRILEX 2 MG MT GUM
2.0000 mg | CHEWING_GUM | OROMUCOSAL | Status: DC | PRN
  Administered 2017-12-18: 2 mg via ORAL

## 2017-12-18 NOTE — Progress Notes (Addendum)
[facility] MD Progress Note  12/18/2017 9:06 AM Sarah Mcclure  MRN:  [id number]  Subjective: Im doing better  During the evaluation today she reports improvement in her overall symptoms since her admissions. This noted by decrease in anxiety and anger. Today she rates her anxiety 3/10 and her irritability 3/10. SHe reports she has a plan together that she will adhere to. She has developed long term and short term goals, reporting that she is going to go stay with her sister who has twin babies.She is also able to reflect on reasons that led up to her admission, and ways to prevent her from having a psychotic break again. She presents alert, oriented & aware of situation. She is visible on the unit, attending group sessions. She has agreed to continue current treatment plan already in progress. Other than feeling a little blurry earlier today, she says she is tolerating her treatment regimen. She is taking Abilify 10mg  po daily, with effective results. We are continuing her treatment to stabilization. She reports sleeping and eating well with no disturbances at this time. She denies SI/HI/AVH at this time, does not appear to be responding to internal stimuli.   Sarah Mcclure is a 32 y/o F with history of bipolar disorder and polysubstance abuse who was admitted on IVC from ED where she had self presented with complaints of worsening depression, SI with plan to strangle herself, AH/VH of seeing the devil, and illicit substance use. Pt has recent relevant history of presentation to ED on 12/11/17 with symptoms of mania and substance use, and she was discharged with resources for substance use treatment. Pt reported in ED that she had used a gram of an unknown substance after her last discharge from the ED and her symptoms had worsened in terms of her mood symptoms in addition to feeling that she was withdrawing from the unknown substance. UDS was positive for benzodiazepines, opiates, and THC.  Principal  Problem: Bipolar I disorder, current or most recent episode depressed, with psychotic features [facility])  Diagnosis:   Patient Active Problem List   Diagnosis Date Noted  . Bipolar I disorder, current or most recent episode depressed, with psychotic features (HCC) [F31.5] 12/16/2017  . Polysubstance (including opioids) dependence, daily use (HCC) [F19.20] 12/15/2017  . Substance induced mood disorder (HCC) [F19.94] 12/11/2017  . Polysubstance dependence including opioid type drug, episodic abuse (HCC) [F11.20, F19.20] 12/11/2017  . Breast lump on left side at 6 o'clock position [N63.20] 06/29/2013  . Breast lump on right side at 3 o'clock position [N63.10] 06/29/2013  . Cat allergies [J30.81] 05/25/2012  . Family history of systemic lupus erythematosus [Z82.69] 04/13/2012  . Back pain [M54.9] 04/13/2012  . Pleuritic chest pain [R07.81] 04/13/2012  . Shin splints [W09.811B] 04/13/2012   Total Time spent with patient: 30 minutes  Past Psychiatric History: See H&P.  Past Medical History:  Past Medical History:  Diagnosis Date  . Allergy   . Asthma    As a child  . Chronic back pain greater than 3 months duration 2013  . Depression   . Fibromyalgia    History reviewed. No pertinent surgical history.  Family History:  Family History  Problem Relation Age of Onset  . Alcohol abuse Mother   . Arthritis Mother   . Lupus Father   . Alcohol abuse Father   . Heart disease Father   . Depression Father   . Heart disease Maternal Grandfather    Family Psychiatric  History: See H&P.  Social History:  Social History   Substance and Sexual Activity  Alcohol Use No     Social History   Substance and Sexual Activity  Drug Use Yes  . Types: Marijuana, Other-see comments   Comment: molly    Social History   Socioeconomic History  . Marital status: Single    Spouse name: None  . Number of children: None  . Years of education: None  . Highest education level: None  Social Needs   . Financial resource strain: None  . Food insecurity - worry: None  . Food insecurity - inability: None  . Transportation needs - medical: None  . Transportation needs - non-medical: None  Occupational History  . None  Tobacco Use  . Smoking status: Current Some Day Smoker    Types: Cigarettes  . Smokeless tobacco: Never Used  Substance and Sexual Activity  . Alcohol use: No  . Drug use: Yes    Types: Marijuana, Other-see comments    Comment: molly  . Sexual activity: Yes    Birth control/protection: IUD  Other Topics Concern  . None  Social History Narrative  . None   Additional Social History:   Sleep: Good  Appetite:  Good  Current Medications: Current Facility-Administered Medications  Medication Dose Route Frequency Provider Last Rate Last Dose  . acetaminophen (TYLENOL) tablet 650 mg  650 mg Oral Q6H PRN Laveda Abbe, NP      . alum & mag hydroxide-simeth (MAALOX/MYLANTA) 200-200-20 MG/5ML suspension 30 mL  30 mL Oral Q4H PRN Laveda Abbe, NP   30 mL at 12/16/17 [phone]  . ARIPiprazole (ABILIFY) tablet 10 mg  10 mg Oral Daily Micheal Likens, MD   10 mg at 12/18/17 9604  . cloNIDine (CATAPRES) tablet 0.1 mg  0.1 mg Oral BH-qamhs Donell Sievert E, PA-C   0.1 mg at 12/18/17 5409   Followed by  . [START ON 12/20/2017] cloNIDine (CATAPRES) tablet 0.1 mg  0.1 mg Oral QAC breakfast Kerry Hough, PA-C      . dicyclomine (BENTYL) tablet 20 mg  20 mg Oral Q6H PRN Donell Sievert E, PA-C      . gabapentin (NEURONTIN) capsule 300 mg  300 mg Oral TID Micheal Likens, MD   300 mg at 12/18/17 0806  . hydrOXYzine (ATARAX/VISTARIL) tablet 50 mg  50 mg Oral Q6H PRN Micheal Likens, MD   50 mg at 12/17/17 2117  . loperamide (IMODIUM) capsule 2-4 mg  2-4 mg Oral PRN Kerry Hough, PA-C      . ziprasidone (GEODON) injection 20 mg  20 mg Intramuscular Q12H PRN Laveda Abbe, NP       And  . LORazepam (ATIVAN) tablet 1 mg  1 mg Oral  PRN Laveda Abbe, NP      . magnesium hydroxide (MILK OF MAGNESIA) suspension 30 mL  30 mL Oral Daily PRN Laveda Abbe, NP      . methocarbamol (ROBAXIN) tablet 500 mg  500 mg Oral Q8H PRN Kerry Hough, PA-C   500 mg at 12/17/17 0119  . naproxen (NAPROSYN) tablet 500 mg  500 mg Oral BID PRN Kerry Hough, PA-C   500 mg at 12/17/17 8119  . ondansetron (ZOFRAN) tablet 4 mg  4 mg Oral Q8H PRN Laveda Abbe, NP      . traZODone (DESYREL) tablet 50 mg  50 mg Oral QHS Laveda Abbe, NP   50 mg at 12/17/17 2117  Lab Results:  Results for orders placed or performed during the hospital encounter of 12/15/17 (from the past 48 hour(s))  Lipid panel     Status: None   Collection Time: 12/17/17  7:32 AM  Result Value Ref Range   Cholesterol 128 0 - 200 mg/dL   Triglycerides 76 <284<150 mg/dL   HDL 44 >13>40 mg/dL   Total CHOL/HDL Ratio 2.9 RATIO   VLDL 15 0 - 40 mg/dL   LDL Cholesterol 69 0 - 99 mg/dL    Comment:        Total Cholesterol/HDL:CHD Risk Coronary Heart Disease Risk Table                     Men   Women  1/2 Average Risk   3.4   3.3  Average Risk       5.0   4.4  2 X Average Risk   9.6   7.1  3 X Average Risk  23.4   11.0        Use the calculated Patient Ratio above and the CHD Risk Table to determine the patient's CHD Risk.        ATP III CLASSIFICATION (LDL):  <100     mg/dL   Optimal  [phone]  mg/dL   Near or Above                    Optimal  130-159  mg/dL   Borderline  [id number]  mg/dL   High  >644>190     mg/dL   Very High Performed at [facility], [address] [place], [place] [address]   TSH     Status: None   Collection Time: 12/17/17  7:32 AM  Result Value Ref Range   TSH 1.384 0.350 - 4.500 uIU/mL    Comment: Performed by a 3rd Generation assay with a functional sensitivity of <=0.01 uIU/mL. Performed at [facility], [address] [place], [place] [address]   Hemoglobin A1c     Status:  None   Collection Time: 12/17/17  7:32 AM  Result Value Ref Range   Hgb A1c MFr Bld 5.0 4.8 - 5.6 %    Comment: (NOTE) Pre diabetes:          5.7%-6.4% Diabetes:              >6.4% Glycemic control for   <7.0% adults with diabetes    Mean Plasma Glucose 96.8 mg/dL    Comment: Performed at [facility], [address] [place], [place] [address]   Blood Alcohol level:  Lab Results  Component Value Date   ETH <10 12/12/2017   ETH <10 12/11/2017   Metabolic Disorder Labs: Lab Results  Component Value Date   HGBA1C 5.0 12/17/2017   MPG 96.8 12/17/2017   No results found for: PROLACTIN Lab Results  Component Value Date   CHOL 128 12/17/2017   TRIG 76 12/17/2017   HDL 44 12/17/2017   CHOLHDL 2.9 12/17/2017   VLDL 15 12/17/2017   LDLCALC 69 12/17/2017   Physical Findings: AIMS: Facial and Oral Movements Muscles of Facial Expression: None, normal Lips and Perioral Area: None, normal Jaw: None, normal Tongue: None, normal,Extremity Movements Upper (arms, wrists, hands, fingers): None, normal Lower (legs, knees, ankles, toes): None, normal, Trunk Movements Neck, shoulders, hips: None, normal, Overall Severity Severity of abnormal movements (highest score from questions above): None, normal Incapacitation due to abnormal movements: None,  normal Patient's awareness of abnormal movements (rate only patient's report): No Awareness, Dental Status Current problems with teeth and/or dentures?: No Does patient usually wear dentures?: No  CIWA:    COWS:  COWS Total Score: 0  Musculoskeletal: Strength & Muscle Tone: within normal limits Gait & Station: normal Patient leans: N/A  Psychiatric Specialty Exam: Physical Exam  Nursing note and vitals reviewed.   Review of Systems  Psychiatric/Behavioral: Positive for depression ("Improving"), hallucinations (Hx. Psychosis) and substance abuse (Hx. Polysubstance use disorder). Negative for memory loss and suicidal ideas. The  patient is not nervous/anxious and does not have insomnia.     Blood pressure 127/74, pulse 61, temperature 98.6 F (37 C), temperature source Oral, resp. rate 18, height 5\' 1"  (1.549 m), weight 53.5 kg (118 lb), last menstrual period 11/19/2017.Body mass index is 22.3 kg/m.  General Appearance: Casual and Fairly Groomed  Eye Contact:  Good  Speech:  Clear and Coherent and Normal Rate  Volume:  Normal  Mood:  Anxious and Irritable, improving  Affect:  Appropriate, Congruent   Thought Process:  Coherent and Goal Directed  Orientation:  Full (Time, Place, and Person)  Thought Content:  Logical  Suicidal Thoughts:  No  Homicidal Thoughts:  No  Memory:  Immediate;   Fair Recent;   Fair Remote;   Fair  Judgement:  Impaired  Insight:  Lacking  Psychomotor Activity:  Normal  Concentration:  Concentration: Fair  Recall:  [employer] of Knowledge:  Fair  Language:  Fair  Akathisia:  No  Handed:    AIMS (if indicated):     Assets:  Resilience  ADL's:  Intact  Cognition:  WNL  Sleep:  Number of Hours: 6.0     Treatment Plan Summary: Daily contact with patient to assess and evaluate symptoms and progress in treatment: Continue inpatient hospitalization.    -  Will continue today 12/18/2017 plan as below except where it is noted.  Mood control.    - Continue Abilify 10 mg po daily.  Opioid detox.    - Continue the Clonidine detox protocols already in progress.  Agitation.    - Continue Gaabpentin 300 mg po tid.  Anxiety.    - Continue Hydroxyzine 50 mg po Q 6 hours prn.  Agitation/psychosis.    - Continue the agitation protocols with Geodon 20 mg & Lorazepam 2 mg IM Q 6 hours prn.        - patient to participate in the group counseling session.    - Discharge plan on going.  Truman Hayward, FNP, 12/18/2017, 9:06 AM   Agree with NP Progress Note

## 2017-12-18 NOTE — Progress Notes (Signed)
Adult Psychoeducational Group Note  Date:  12/18/2017 Time:  9:20 PM  Group Topic/Focus:  Wrap-Up Group:   The focus of this group is to help patients review their daily goal of treatment and discuss progress on daily workbooks.  Participation Level:  Active  Participation Quality:  Appropriate  Affect:  Appropriate  Cognitive:  Appropriate  Insight: Appropriate  Engagement in Group:  Engaged  Modes of Intervention:  Discussion  Additional Comments:  Patient attended wrap-up group and participated.   Sarah Mcclure W Arda Daggs 12/18/2017, 9:20 PM

## 2017-12-18 NOTE — [facility] Group Notes (Signed)
[facility] Group Notes:  (Nursing/MHT/Case Management/Adjunct)  Date:  12/18/2017  Time:  6:33 PM  Type of Therapy:  Psychoeducational Skills  Participation Level:  Did Not Attend  Participation Quality:  DID NOT ATTEND  Affect:  DID NOT ATTEND  Cognitive:  DID NOT ATTEND  Insight:  None  Engagement in Group:  DID NOT ATTEND  Modes of Intervention:  DID NOT ATTEND  Summary of Progress/Problems: Pt did not attend patient self inventory group.   Kasheena Sambrano O Jalexia Lalli 12/18/2017, 6:33 PM 

## 2017-12-18 NOTE — Progress Notes (Signed)
DAR NOTE: Patient presents with calm affect and pleasant mood.  Pt complained menstrual  Pain, denies auditory and visual hallucinations. Reports good sleep, good appetite, normal energy, and good concentration. Rates depression at 0, hopelessness at 0, and anxiety at 0.  Maintained on routine safety checks.  Medications given as prescribed.  Support and encouragement offered as needed.  Attended group and participated.  States goal for today is " getting home.".  Patient observed socializing with peers in the dayroom.  Offered no complaint.

## 2017-12-18 NOTE — [facility] Group Notes (Signed)
[facility] Group Therapy  12/18/2017 , 1:02 PM    Type of Therapy:  [employer] Presentation  Participation Level:  Active  Participation Quality:  Attentive  Affect:  Blunted  Cognitive:  Oriented  Insight:  Limited  Engagement in Therapy:  Engaged  Modes of Intervention:  Discussion, Education and Socialization  Summary of Progress/Problems:  Tammi  from [employer] came to present her recovery story, encourage group  members to share something about their story, and present information about the [employer].  Stayed the entire time, engaged throughout.  Daryel Geraldorth, Violetta Lavalle B 12/18/2017 , 1:02 PM

## 2017-12-18 NOTE — Progress Notes (Signed)
Recreation Therapy Notes  Date: 12/18/17 Time: 1000 Location: 500 Hall Dayroom  Group Topic: Communication  Goal Area(s) Addresses:  Patient will effectively communicate with peers in group.  Patient will verbalize benefit of healthy communication. Patient will verbalize positive effect of healthy communication on post d/c goals.  Patient will identify communication techniques that made activity effective for group.   Behavioral Response: Engaged  Intervention: ArchitectGeometrical designs, pencils, blank paper, chairs  Activity: Back to Back Drawings.  LRT divided patients into pairs of two.  One person was designated the speaker while the other person was the listener.  The speaker was given a picture they were to describe to their partner.  Their partner was to then draw the picture as it was described to them.  The listener was not allowed to ask any questions.  Once done they would compare pictures.  The two would then switch roles and LRT would give them a new picture to draw.  Education: Communication, Discharge Planning  Education Outcome: Acknowledges understanding/In group clarification offered/Needs additional education.   Clinical Observations/Feedback:  Pt arrived late.  Pt was added to another group.  Pt stated the speaker gave "clear instructions but it was hard to interpret them".  Pt also stated "it would have been easier if I were able to go back and get clarification on the first step because then I would have been able to get everything else from there".  Pt stated in a real life conversation she could listen to who she is talking to, make eye contact and ask questions.     Caroll RancherMarjette Kruze Atchley, LRT/CTRS      Lillia AbedLindsay, Clover Feehan A 12/18/2017 11:02 AM

## 2017-12-18 NOTE — Progress Notes (Signed)
   D: Informed the writer that she's ready for discharge. Stated, she plans to stay with her sister for a while to help with the twins that her sister recently gave birth to. Afterwards pt plans to return to her home. Pt has no questions or concerns.    A:  Support and encouragement was offered. 15 min checks continued for safety.  R: Pt remains safe.

## 2017-12-19 DIAGNOSIS — Z975 Presence of (intrauterine) contraceptive device: Secondary | ICD-10-CM

## 2017-12-19 MED ORDER — GABAPENTIN 300 MG PO CAPS: 300.0000 mg | ORAL_CAPSULE | Freq: Three times a day (TID) | ORAL | 0 refills | Status: DC

## 2017-12-19 MED ORDER — TRAZODONE HCL 50 MG PO TABS: 50.0000 mg | ORAL_TABLET | Freq: Every day | ORAL | 0 refills | Status: DC

## 2017-12-19 MED ORDER — NICOTINE POLACRILEX 2 MG MT GUM: 2.0000 mg | CHEWING_GUM | OROMUCOSAL | 0 refills | Status: DC | PRN

## 2017-12-19 MED ORDER — ARIPIPRAZOLE 10 MG PO TABS: 10.0000 mg | ORAL_TABLET | Freq: Every day | ORAL | 0 refills | Status: DC

## 2017-12-19 MED ORDER — HYDROXYZINE HCL 50 MG PO TABS: 50.0000 mg | ORAL_TABLET | Freq: Four times a day (QID) | ORAL | 0 refills | Status: DC | PRN

## 2017-12-19 NOTE — Progress Notes (Signed)
Patient discharged to lobby. Patient was stable and appreciative at that time. All papers, samples and prescriptions were given and valuables returned. Verbal understanding expressed. Denies SI/HI and A/VH. Patient given opportunity to express concerns and ask questions.  

## 2017-12-19 NOTE — Plan of Care (Cosign Needed)
3.8.19 Patient attended and participated appropriately during Recreation Therapy group treatment successfully engaging in group with a calm and appropriate mood at least 2x

## 2017-12-19 NOTE — Discharge Summary (Addendum)
Physician Discharge Summary Note  Patient:  Sarah Mcclure is an 32 y.o., female  MRN:  [id number]  DOB:  1985-12-08  Patient phone:  [phone] (home)   Patient address:   [address] [place] [address],   Total Time spent with patient: Greater than 30 minutes  Date of Admission:  12/15/2017  Date of Discharge: 12-19-17  Reason for Admission: Worsening symptoms of depression, suicidal ideations, illicit drug use & hallucinations.  Principal Problem: Bipolar I disorder, current or most recent episode depressed, with psychotic features [facility])  Discharge Diagnoses: Patient Active Problem List   Diagnosis Date Noted  . Bipolar I disorder, current or most recent episode depressed, with psychotic features (HCC) [F31.5] 12/16/2017  . Polysubstance (including opioids) dependence, daily use (HCC) [F19.20] 12/15/2017  . Substance induced mood disorder (HCC) [F19.94] 12/11/2017  . Polysubstance dependence including opioid type drug, episodic abuse (HCC) [F11.20, F19.20] 12/11/2017  . Breast lump on left side at 6 o'clock position [N63.20] 06/29/2013  . Breast lump on right side at 3 o'clock position [N63.10] 06/29/2013  . Cat allergies [J30.81] 05/25/2012  . Family history of systemic lupus erythematosus [Z82.69] 04/13/2012  . Back pain [M54.9] 04/13/2012  . Pleuritic chest pain [R07.81] 04/13/2012  . Shin splints [I95.188C] 04/13/2012   Past Psychiatric History: Hx. Polysubstance use disorder, Bipolar 1 disorder.  Past Medical History:  Past Medical History:  Diagnosis Date  . Allergy   . Asthma    As a child  . Chronic back pain greater than 3 months duration 2013  . Depression   . Fibromyalgia    History reviewed. No pertinent surgical history. Family History:  Family History  Problem Relation Age of Onset  . Alcohol abuse Mother   . Arthritis Mother   . Lupus Father   . Alcohol abuse Father   . Heart disease Father   . Depression Father   . Heart disease  Maternal Grandfather    Family Psychiatric  History: See H&P. Social History:  Social History   Substance and Sexual Activity  Alcohol Use No     Social History   Substance and Sexual Activity  Drug Use Yes  . Types: Marijuana, Other-see comments   Comment: molly    Social History   Socioeconomic History  . Marital status: Single    Spouse name: None  . Number of children: None  . Years of education: None  . Highest education level: None  Social Needs  . Financial resource strain: None  . Food insecurity - worry: None  . Food insecurity - inability: None  . Transportation needs - medical: None  . Transportation needs - non-medical: None  Occupational History  . None  Tobacco Use  . Smoking status: Current Some Day Smoker    Types: Cigarettes  . Smokeless tobacco: Never Used  Substance and Sexual Activity  . Alcohol use: No  . Drug use: Yes    Types: Marijuana, Other-see comments    Comment: molly  . Sexual activity: Yes    Birth control/protection: IUD  Other Topics Concern  . None  Social History Narrative  . None   Hospital Course: (Per Md's admission assessment): Sarah Mcclure is a 32 y/o F with history of bipolar disorder and polysubstance abuse who was admitted on IVC from ED where she had self presented with complaints of worsening depression, SI with plan to strangle herself, AH/VH of seeing the devil, and illicit substance use. Pt has recent relevant history of  presentation to ED on 12/11/17 with symptoms of mania and substance use, and she was discharged with resources for substance use treatment. Pt reported in ED that she had used a gram of an unknown substance after her last discharge from the ED and her symptoms had worsened in terms of her mood symptoms in addition to feeling that she was withdrawing from the unknown substance. UDS was positive for benzodiazepines.   Sarah Mcclure was admitted to the hospital for worsening symptoms of depression & crisis  management due to suicidal thoughts & worsening psychosis. She was also using illicit drugs. She was admitted under IVC petition for mood stabilization treatments. After her admission assessment, her presenting symptoms were identified. The medication regimen targeting those symptoms were initiated. She received Clonidine detox protocols for opioid detox. She was medicated & discharged on; Abilify 10 mg for mood control, Gabapentin 300 mg for agitation, Hydroxyzine 50 mg prn for anxiety, Nicorette gum 2 mg for Nicotine withdrawal symptoms & Trazodone 50 mg for insomnia. She presented no other significant pre-existing medical problems that required treatments. She tolerated her treatment regimen without any adverse effects reported. Sarah Mcclure was enrolled & participated in the group counseling sessions being offered & held on this unit. She learned coping skills.  During the course of her hospitalization, Sarah Mcclure's improvement was monitored by observation & her daily report of symptom reduction noted. Her emotional and mental status were monitored by daily self-inventory reports completed by her & the clinical staff. She was evaluated daily by the treatment team for stability and plans for continued recovery after discharge. Sarah Mcclure was offered further treatment options upon discharge on an outpatient basis as noted below. She was provided with all the necessary information needed to make that appointment without problems.   Upon discharge, Sarah Mcclure was both mentally and medically stable. She denies suicidal/homicidal ideations, auditory/visual/tactile hallucinations, delusional thoughts or paranoia. She received from the [facility] pharmacy, a 7 days worth, supply samples of her [facility] discharge medications. She left [facility] with all belongings in no distress. Transportation per family.  Physical Findings: AIMS: Facial and Oral Movements Muscles of Facial Expression: None, normal Lips and Perioral Area: None, normal Jaw: None,  normal Tongue: None, normal,Extremity Movements Upper (arms, wrists, hands, fingers): None, normal Lower (legs, knees, ankles, toes): None, normal, Trunk Movements Neck, shoulders, hips: None, normal, Overall Severity Severity of abnormal movements (highest score from questions above): None, normal Incapacitation due to abnormal movements: None, normal Patient's awareness of abnormal movements (rate only patient's report): No Awareness, Dental Status Current problems with teeth and/or dentures?: No Does patient usually wear dentures?: No  CIWA:    COWS:  COWS Total Score: 0  Musculoskeletal: Strength & Muscle Tone: within normal limits Gait & Station: normal Patient leans: N/A  Psychiatric Specialty Exam: Physical Exam  Constitutional: She appears well-developed.  HENT:  Head: Normocephalic.  Eyes: Pupils are equal, round, and reactive to light.  Neck: Normal range of motion.  Cardiovascular: Normal rate.  Respiratory: Effort normal.  GI: Soft.  Genitourinary:  Genitourinary Comments: Deferred  Musculoskeletal: Normal range of motion.  Neurological: She is alert.  Skin: Skin is warm.    Review of Systems  Constitutional: Negative.   HENT: Negative.   Eyes: Negative.   Respiratory: Negative.   Cardiovascular: Negative.   Gastrointestinal: Negative.   Genitourinary: Negative.   Musculoskeletal: Negative.   Skin: Negative.   Neurological: Negative.   Endo/Heme/Allergies: Negative.   Psychiatric/Behavioral: Positive for depression (Stable), hallucinations (Hx. psychosis) and substance abuse (  Hx. Opioid use disorder). Negative for memory loss and suicidal ideas. The patient has insomnia (Stable). The patient is not nervous/anxious.     Blood pressure 119/81, pulse 70, temperature 98 F (36.7 C), temperature source Oral, resp. rate 16, height 5\' 1"  (1.549 m), weight 53.5 kg (118 lb), last menstrual period 11/19/2017.Body mass index is 22.3 kg/m.  See Md's SRA   Have you  used any form of tobacco in the last 30 days? (Cigarettes, Smokeless Tobacco, Cigars, and/or Pipes): Yes  Has this patient used any form of tobacco in the last 30 days? (Cigarettes, Smokeless Tobacco, Cigars, and/or Pipes): Yes, an FDA-approved tobacco cessation medication was offered at discharge.  Blood Alcohol level:  Lab Results  Component Value Date   ETH <10 12/12/2017   ETH <10 12/11/2017   Metabolic Disorder Labs:  Lab Results  Component Value Date   HGBA1C 5.0 12/17/2017   MPG 96.8 12/17/2017   Lab Results  Component Value Date   PROLACTIN 18.8 12/17/2017   Lab Results  Component Value Date   CHOL 128 12/17/2017   TRIG 76 12/17/2017   HDL 44 12/17/2017   CHOLHDL 2.9 12/17/2017   VLDL 15 12/17/2017   LDLCALC 69 12/17/2017   See Psychiatric Specialty Exam and Suicide Risk Assessment completed by Attending Physician prior to discharge.  Discharge destination:  Home  Is patient on multiple antipsychotic therapies at discharge:  No   Has Patient had three or more failed trials of antipsychotic monotherapy by history:  No  Recommended Plan for Multiple Antipsychotic Therapies: NA  Allergies as of 12/19/2017      Reactions   Amoxicillin Other (See Comments)   Has patient had a PCN reaction causing immediate rash, facial/tongue/throat swelling, SOB or lightheadedness with hypotension: Unknown Has patient had a PCN reaction causing severe rash involving mucus membranes or skin necrosis: No Has patient had a PCN reaction that required hospitalization: Unknown Has patient had a PCN reaction occurring within the last 10 years: No If all of the above answers are "NO", then may proceed with Cephalosporin use.   Penicillins Other (See Comments)   Has patient had a PCN reaction causing immediate rash, facial/tongue/throat swelling, SOB or lightheadedness with hypotension: Unknown Has patient had a PCN reaction causing severe rash involving mucus membranes or skin necrosis:  No Has patient had a PCN reaction that required hospitalization: Unknown Has patient had a PCN reaction occurring within the last 10 years: No If all of the above answers are "NO", then may proceed with Cephalosporin use.      Medication List    STOP taking these medications   ibuprofen 200 MG tablet Commonly known as:  ADVIL,MOTRIN   LAMICTAL PO     TAKE these medications     Indication  ARIPiprazole 10 MG tablet Commonly known as:  ABILIFY Take 1 tablet (10 mg total) by mouth daily. For mood control Start taking on:  12/20/2017  Indication:  Mood control   gabapentin 300 MG capsule Commonly known as:  NEURONTIN Take 1 capsule (300 mg total) by mouth 3 (three) times daily. For agitation  Indication:  Agitation   hydrOXYzine 50 MG tablet Commonly known as:  ATARAX/VISTARIL Take 1 tablet (50 mg total) by mouth every 6 (six) hours as needed for anxiety.  Indication:  Feeling Anxious   nicotine polacrilex 2 MG gum Commonly known as:  NICORETTE Take 1 each (2 mg total) by mouth as needed for smoking cessation. (May purchase from over the  counter): For smoking cessation  Indication:  Nicotine Addiction   traZODone 50 MG tablet Commonly known as:  DESYREL Take 1 tablet (50 mg total) by mouth at bedtime. For sleep  Indication:  Trouble Sleeping      Follow-up Information    [facility] Follow up on 12/23/2017.   Why:  Tuesday at 8AM with Nicole Cellaorothy for your hospital follow up appointment. Contact information: [address] [place] [address] [phone]        [facility] Follow up on 12/31/2017.   Specialty:  Behavioral Health Why:  Appointment is 12/31/17, Wednesday, 1:30pm.for a meeting with your therapist Contact information: [address] [place] [address] [place] [address] [phone]          Follow-up recommendations:  Activity:  As tolerated Diet: As recommended by your primary care doctor. Keep all scheduled follow-up  appointments as recommended.  Comments: Patient is instructed prior to discharge to: Take all medications as prescribed by his/her mental healthcare provider. Report any adverse effects and or reactions from the medicines to his/her outpatient provider promptly. Patient has been instructed & cautioned: To not engage in alcohol and or illegal drug use while on prescription medicines. In the event of worsening symptoms, patient is instructed to call the crisis hotline, 911 and or go to the nearest ED for appropriate evaluation and treatment of symptoms. To follow-up with his/her primary care provider for your other medical issues, concerns and or health care needs.  Signed: Armandina StammerAgnes Nwoko, NP, PMHNP, FNP-BC 12/19/2017, 9:07 AM   Patient seen, Suicide Assessment Completed.  Disposition Plan Reviewed

## 2017-12-19 NOTE — [facility] Suicide Risk Assessment (Addendum)
[facility] Discharge Suicide Risk Assessment   Principal Problem: Bipolar I disorder, current or most recent episode depressed, with psychotic features [facility]) Discharge Diagnoses:  Patient Active Problem List   Diagnosis Date Noted  . Bipolar I disorder, current or most recent episode depressed, with psychotic features (HCC) [F31.5] 12/16/2017  . Polysubstance (including opioids) dependence, daily use (HCC) [F19.20] 12/15/2017  . Substance induced mood disorder (HCC) [F19.94] 12/11/2017  . Polysubstance dependence including opioid type drug, episodic abuse (HCC) [F11.20, F19.20] 12/11/2017  . Breast lump on left side at 6 o'clock position [N63.20] 06/29/2013  . Breast lump on right side at 3 o'clock position [N63.10] 06/29/2013  . Cat allergies [J30.81] 05/25/2012  . Family history of systemic lupus erythematosus [Z82.69] 04/13/2012  . Back pain [M54.9] 04/13/2012  . Pleuritic chest pain [R07.81] 04/13/2012  . Shin splints [B14.782N] 04/13/2012    Total Time spent with patient: 30 minutes  Musculoskeletal: Strength & Muscle Tone: within normal limits Gait & Station: normal Patient leans: N/A  Psychiatric Specialty Exam: ROS no headache, no chest pain, no shortness of breath, no vomiting , no fever  Blood pressure 119/81, pulse 70, temperature 98 F (36.7 C), temperature source Oral, resp. rate 16, height 5\' 1"  (1.549 m), weight 53.5 kg (118 lb), last menstrual period 11/19/2017.Body mass index is 22.3 kg/m.  General Appearance: Well Groomed  Eye Contact::  Good  Speech:  Normal Rate409  Volume:  Normal  Mood:  states " my mood is good", " I am happy". Denies feeling depressed  Affect:  Appropriate and reactive   Thought Process:  Linear and Descriptions of Associations: Intact  Orientation:  Other:  fully alert and attentive   Thought Content:  denies hallucinations, no delusions, not internally preoccupied   Suicidal Thoughts:  No denies suicidal or self injurious ideations, denies  homicidal or violent ideations   Homicidal Thoughts:  No  Memory:  recent and remote grossly intact   Judgement:  Other:  improving   Insight:  improving   Psychomotor Activity:  Normal  Concentration:  Good  Recall:  Good  Fund of Knowledge:Good  Language: Good  Akathisia:  Negative  Handed:  Right  AIMS (if indicated):   no abnormal or involuntary movements noted or reported   Assets:  Communication Skills Desire for Improvement Resilience  Sleep:  Number of Hours: 4.75  Cognition: WNL  ADL's:  Intact   Mental Status Per Nursing Assessment::   On Admission:     Demographic Factors:  32 year old female , single, plans to live with her sister after discharging . Employed .   Loss Factors: Reports loss of loved ones . Relationship break up, financial issues. Was abusing opiates prior to admissions.  Historical Factors: Reports she has been diagnosed with Bipolar Disorder in the past. Reports history of several suicide attempts in the past.   Risk Reduction Factors:   Responsible for children under 37 years of age, Sense of responsibility to family, Employed, Living with another person, especially a relative and Positive coping skills or problem solving skills  Continued Clinical Symptoms:  At this time patient is alert, attentive,well related, calm, reports mood has improved and currently denies feeling depressed and presents with a full range of affect. No thought disorder, no suicidal or self injurious ideations, no homicidal or violent ideations, denies hallucinations, no delusions,future oriented . Denies medication side effects. We reviewed medication side effects, including risk of sedation, and risk of metabolic side effects and motor side effects/akathisia  on Abilify. Behavior on unit in good control, pleasant on approach.   Cognitive Features That Contribute To Risk:  No gross cognitive deficits noted upon discharge. Is alert , attentive, and oriented x 3   Suicide  Risk:  Minimal: No identifiable suicidal ideation.  Patients presenting with no risk factors but with morbid ruminations; may be classified as minimal risk based on the severity of the depressive symptoms  Follow-up Information    [facility] Follow up on 12/23/2017.   Why:  Tuesday at 8AM with Nicole Cellaorothy for your hospital follow up appointment. Contact information: [address] [place] [address] [phone]        [facility] Follow up on 12/31/2017.   Specialty:  Behavioral Health Why:  Appointment is 12/31/17, Wednesday, 1:30pm.for a meeting with your therapist Contact information: [address] [place] [address] [phone]           Plan Of Care/Follow-up recommendations:  Activity:  as tolerated Diet:  regular Tests:  NA Other:  See below  Patient reports readiness for discharge and is leaving unit in good spirits  Plans to go live with sister- states mother will pick her up later today Plans to follow up as above .  Craige CottaFernando A Keaun Schnabel, MD 12/19/2017, 12:00 PM

## 2017-12-19 NOTE — Progress Notes (Signed)
Psychoeducational Group Note  Date:  12/19/2017 Time:  1009  Group Topic/Focus:  Goals Group:   The focus of this group is to help patients establish daily goals to achieve during treatment and discuss how the patient can incorporate goal setting into their daily lives to aide in recovery.  Participation Level: Did Not Attend  Participation Quality:  Not Applicable  Affect:  Not Applicable  Cognitive:  Not Applicable  Insight:  Not Applicable  Engagement in Group: Not Applicable  Additional Comments:  Pt was meeting with the Social Worker and unable to attend group.  Alayja Armas E 12/19/2017, 10:11 AM

## 2017-12-19 NOTE — Progress Notes (Signed)
Recreation Therapy Notes  INPATIENT RECREATION TR PLAN  Patient Details Name: Sarah Mcclure MRN: [id number] DOB: Mar 15, 1986 Today's Date: 12/19/2017  Rec Therapy Plan Is patient appropriate for Therapeutic Recreation?: Yes Treatment times per week: At least three Estimated Length of Stay: 5-7 days  TR Treatment/Interventions: Group participation (Appropriate participation in Recreation Therapy group tx.)  Discharge Criteria Pt will be discharged from therapy if:: Discharged Treatment plan/goals/alternatives discussed and agreed upon by:: Patient/family  Discharge Summary Short term goals set: See Care Plan  Short term goals met: Complete Progress toward goals comments: Groups attended Which groups?: Wellness, Communication, Self-esteem Therapeutic equipment acquired: None  Reason patient discharged from therapy: Discharge from hospital Pt/family agrees with progress & goals achieved: Yes Date patient discharged from therapy: 12/19/17  Ranell Patrick, Recreation Therapy Intern   Ranell Patrick 12/19/2017, 10:45 AM

## 2017-12-19 NOTE — Progress Notes (Signed)
Recreation Therapy Notes  Date: 12/19/17 Time: 1000 Location: 500 Hall  Group Topic: Teambuilding  Goal Area(s) Addresses:  Patient will effectively work with peers in group.  Patient will verbalize benefit of teamwork. Patient will verbalize positive effect of teamwork post d/c goals.  Patient will identify techniques that made activity effective for group.   Behavioral Response: Engaged  Intervention: Social workerubber Disc  Activity: [place] [employer].  Each patient was given a rubber disc and the group as a whole was given one disc.  Patients were to use the discs to [facility] group from the nurse's station to the end of the hall and back to the nurse's station.  Education: Teambuilding, Discharge Planning  Education Outcome: Acknowledges understanding/In group clarification offered/Needs additional education.   Clinical Observations/Feedback: Pt was very active and worked well with peers.  Pt stated the group had to use leg movement and communication to complete activity.  Pt also stated there were factors that were beyond their control such as how far the leader threw out the disc and if someone fell off the disc.  Pt expressed she could "communicate with them (support system) to let them know what problems I'm dealing with" so they can help.     Caroll RancherMarjette Ryelynn Guedea, LRT/CTRS         Lillia AbedLindsay, Galileo Colello A 12/19/2017 10:59 AM

## 2017-12-26 ENCOUNTER — Emergency Department (HOSPITAL_COMMUNITY)
Admission: EM | Admit: 2017-12-26 | Discharge: 2018-01-01 | Disposition: A | Discharge status: Other Acute Inpatient Hospital | Payer: Medicaid Other | Attending: Emergency Medicine | Admitting: Emergency Medicine

## 2017-12-26 ENCOUNTER — Encounter (HOSPITAL_COMMUNITY): Payer: Self-pay | Admitting: Emergency Medicine

## 2017-12-26 DIAGNOSIS — Z79899 Other long term (current) drug therapy: Secondary | ICD-10-CM | POA: Insufficient documentation

## 2017-12-26 DIAGNOSIS — F17210 Nicotine dependence, cigarettes, uncomplicated: Secondary | ICD-10-CM | POA: Insufficient documentation

## 2017-12-26 DIAGNOSIS — F3163 Bipolar disorder, current episode mixed, severe, without psychotic features: Secondary | ICD-10-CM | POA: Diagnosis present

## 2017-12-26 DIAGNOSIS — F3112 Bipolar disorder, current episode manic without psychotic features, moderate: Secondary | ICD-10-CM | POA: Insufficient documentation

## 2017-12-26 DIAGNOSIS — F29 Unspecified psychosis not due to a substance or known physiological condition: Secondary | ICD-10-CM | POA: Insufficient documentation

## 2017-12-26 DIAGNOSIS — F1910 Other psychoactive substance abuse, uncomplicated: Secondary | ICD-10-CM | POA: Insufficient documentation

## 2017-12-26 LAB — ACETAMINOPHEN LEVEL: Acetaminophen (Tylenol), Serum: <10 ug/mL — ABNORMAL LOW (ref 10–30)

## 2017-12-26 LAB — COMPREHENSIVE METABOLIC PANEL
ALT: 26 U/L (ref 14–54)
AST: 20 U/L (ref 15–41)
Albumin: 4.1 g/dL (ref 3.5–5.0)
Alkaline Phosphatase: 66 U/L (ref 38–126)
Anion gap: 8 (ref 5–15)
BUN: 11 mg/dL (ref 6–20)
CO2: 25 mmol/L (ref 22–32)
Calcium: 9.3 mg/dL (ref 8.9–10.3)
Chloride: 106 mmol/L (ref 101–111)
Creatinine, Ser: 0.61 mg/dL (ref 0.44–1.00)
GFR calc Af Amer: >60 mL/min (ref >60)
GFR calc non Af Amer: >60 mL/min (ref >60)
Glucose, Bld: 89 mg/dL (ref 65–99)
Potassium: 3.9 mmol/L (ref 3.5–5.1)
Sodium: 139 mmol/L (ref 135–145)
Total Bilirubin: 0.4 mg/dL (ref 0.3–1.2)
Total Protein: 6.8 g/dL (ref 6.5–8.1)

## 2017-12-26 LAB — CBC
HCT: 38.6 % (ref 36.0–46.0)
Hemoglobin: 13.2 g/dL (ref 12.0–15.0)
MCH: 29.9 pg (ref 26.0–34.0)
MCHC: 34.2 g/dL (ref 30.0–36.0)
MCV: 87.3 fL (ref 78.0–100.0)
Platelets: 324 10*3/uL (ref 150–400)
RBC: 4.42 MIL/uL (ref 3.87–5.11)
RDW: 12.8 % (ref 11.5–15.5)
WBC: 10.9 10*3/uL — ABNORMAL HIGH (ref 4.0–10.5)

## 2017-12-26 LAB — RAPID URINE DRUG SCREEN, HOSP PERFORMED
Amphetamines: NOT DETECTED
Barbiturates: NOT DETECTED
Benzodiazepines: NOT DETECTED
Cocaine: NOT DETECTED
Opiates: NOT DETECTED
Tetrahydrocannabinol: POSITIVE — AB

## 2017-12-26 LAB — I-STAT BETA HCG BLOOD, ED (MC, WL, AP ONLY): I-stat hCG, quantitative: <5 m[IU]/mL (ref <5)

## 2017-12-26 LAB — SALICYLATE LEVEL: Salicylate Lvl: <7 mg/dL (ref 2.8–30.0)

## 2017-12-26 LAB — ETHANOL: Alcohol, Ethyl (B): <10 mg/dL (ref <10)

## 2017-12-26 MED ORDER — GABAPENTIN 300 MG PO CAPS
300.0000 mg | ORAL_CAPSULE | Freq: Three times a day (TID) | ORAL | Status: DC
  Administered 2017-12-28 – 2017-12-31 (×7): 300 mg via ORAL
  Filled 2017-12-26 (×9): qty 1

## 2017-12-26 MED ORDER — HYDROXYZINE HCL 25 MG PO TABS
50.0000 mg | ORAL_TABLET | Freq: Four times a day (QID) | ORAL | Status: DC | PRN
  Administered 2017-12-27: 50 mg via ORAL
  Filled 2017-12-26: qty 2

## 2017-12-26 MED ORDER — ARIPIPRAZOLE 10 MG PO TABS
10.0000 mg | ORAL_TABLET | Freq: Every day | ORAL | Status: DC
  Administered 2017-12-28 – 2018-01-01 (×4): 10 mg via ORAL
  Filled 2017-12-26 (×5): qty 1

## 2017-12-26 MED ORDER — ZOLPIDEM TARTRATE 5 MG PO TABS: 5.0000 mg | ORAL_TABLET | Freq: Every evening | ORAL | Status: DC | PRN

## 2017-12-26 MED ORDER — ACETAMINOPHEN 325 MG PO TABS: 650.0000 mg | ORAL_TABLET | ORAL | Status: DC | PRN

## 2017-12-26 MED ORDER — ALUM & MAG HYDROXIDE-SIMETH 200-200-20 MG/5ML PO SUSP
30.0000 mL | Freq: Four times a day (QID) | ORAL | Status: DC | PRN
  Administered 2017-12-30: 30 mL via ORAL
  Filled 2017-12-26: qty 30

## 2017-12-26 MED ORDER — ONDANSETRON HCL 4 MG PO TABS: 4.0000 mg | ORAL_TABLET | Freq: Three times a day (TID) | ORAL | Status: DC | PRN

## 2017-12-26 MED ORDER — LORAZEPAM 1 MG PO TABS: 1.0000 mg | ORAL_TABLET | Freq: Four times a day (QID) | ORAL | Status: DC | PRN

## 2017-12-26 NOTE — ED Notes (Signed)
Patient anxious and tearful. Reports hx anxiety. States "I have really bad restless legs." Then states "I need this thing taken off of my finger. It is some kind of cancer I am sure. But I know the OR probably isn't open at this hour." Patient redirected.

## 2017-12-26 NOTE — ED Notes (Signed)
[facility] Expected date:  Expected time:  Means of arrival:  Comments: Harmen

## 2017-12-26 NOTE — ED Notes (Signed)
Patient given sandwich and cheese stick.

## 2017-12-26 NOTE — ED Triage Notes (Signed)
Patient BIB [facility] with IVC paperwork stating, "[facility]. Respondent is off her medication; hyperverbal; difficulty concentrating. Respondent is searching "for something to harm myself." Respondent appears to be responding to internal stimuli. Respondent is a danger to self/others."
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
## 2017-12-26 NOTE — ED Notes (Signed)
SBAR Report received from previous nurse. Pt received calm and visible on unit. Pt denies current SI/ HI, A/V H, depression, anxiety, or pain at this time, and appears otherwise stable and free of distress. Pt reminded of camera surveillance, q 15 min rounds, and rules of the milieu. Will continue to assess. 

## 2017-12-26 NOTE — ED Notes (Signed)
ED Provider at bedside. 

## 2017-12-26 NOTE — ED Provider Notes (Signed)
[facility]-EMERGENCY DEPT Provider Note   CSN: [id number] Arrival date & time: 12/26/17  1728     History   Chief Complaint Chief Complaint  Patient presents with  . IVC    HPI Sarah Mcclure is a 32 y.o. female.  The history is provided by the patient. No language interpreter was used.  Mental Health Problem  Presenting symptoms: depression and disorganized speech   Patient accompanied by:  Law enforcement Degree of incapacity (severity):  Moderate Onset quality:  Sudden Timing:  Constant Progression:  Worsening Chronicity:  New Context: drug abuse   Treatment compliance:  Some of the time Relieved by:  Nothing Worsened by:  Nothing Ineffective treatments:  None tried Risk factors: hx of mental illness   Pt reports she has been using cocaine and stopped her psychiatric medications.  Pt reports she became concerned about lamictal and decided not to take it.   Past Medical History:  Diagnosis Date  . Allergy   . Asthma    As a child  . Chronic back pain greater than 3 months duration 2013  . Depression   . Fibromyalgia     Patient Active Problem List   Diagnosis Date Noted  . Bipolar I disorder, current or most recent episode depressed, with psychotic features (HCC) 12/16/2017  . Polysubstance (including opioids) dependence, daily use (HCC) 12/15/2017  . Substance induced mood disorder (HCC) 12/11/2017  . Polysubstance dependence including opioid type drug, episodic abuse (HCC) 12/11/2017  . Breast lump on left side at 6 o'clock position 06/29/2013  . Breast lump on right side at 3 o'clock position 06/29/2013  . Cat allergies 05/25/2012  . Family history of systemic lupus erythematosus 04/13/2012  . Back pain 04/13/2012  . Pleuritic chest pain 04/13/2012  . Shin splints 04/13/2012    History reviewed. No pertinent surgical history.  OB History    Gravida Para Term Preterm AB Living   5 2 2   3 2    SAB TAB Ectopic Multiple Live  Births   2 1             Home Medications    Prior to Admission medications   Medication Sig Start Date End Date Taking? Authorizing Provider  ARIPiprazole (ABILIFY) 10 MG tablet Take 1 tablet (10 mg total) by mouth daily. For mood control 12/20/17  Yes Nwoko, Nicole Kindred I, NP  gabapentin (NEURONTIN) 300 MG capsule Take 1 capsule (300 mg total) by mouth 3 (three) times daily. For agitation 12/19/17  Yes Armandina Stammer I, NP  hydrOXYzine (ATARAX/VISTARIL) 50 MG tablet Take 1 tablet (50 mg total) by mouth every 6 (six) hours as needed for anxiety. 12/19/17  Yes Armandina Stammer I, NP  traZODone (DESYREL) 50 MG tablet Take 1 tablet (50 mg total) by mouth at bedtime. For sleep 12/19/17  Yes Armandina Stammer I, NP  nicotine polacrilex (NICORETTE) 2 MG gum Take 1 each (2 mg total) by mouth as needed for smoking cessation. (May purchase from over the counter): For smoking cessation Patient not taking: Reported on 12/26/2017 12/19/17   Sanjuana Kava, NP    Family History Family History  Problem Relation Age of Onset  . Alcohol abuse Mother   . Arthritis Mother   . Lupus Father   . Alcohol abuse Father   . Heart disease Father   . Depression Father   . Heart disease Maternal Grandfather     Social History Social History   Tobacco Use  . Smoking  status: Current Some Day Smoker    Types: Cigarettes  . Smokeless tobacco: Never Used  Substance Use Topics  . Alcohol use: No  . Drug use: Yes    Types: Marijuana, Other-see comments    Comment: molly     Allergies   Amoxicillin and Penicillins   Review of Systems Review of Systems  All other systems reviewed and are negative.    Physical Exam Updated Vital Signs BP 122/82 (BP Location: Left Arm)   Pulse 80   Temp 98.4 F (36.9 C) (Oral)   Resp 18   SpO2 100%   Physical Exam  Constitutional: She appears well-developed and well-nourished. No distress.  HENT:  Head: Normocephalic and atraumatic.  Eyes: Conjunctivae are normal.  Neck: Neck  supple.  Cardiovascular: Normal rate and regular rhythm.  No murmur heard. Pulmonary/Chest: Effort normal and breath sounds normal. No respiratory distress.  Abdominal: Soft. There is no tenderness.  Musculoskeletal: She exhibits no edema.  Neurological: She is alert.  Skin: Skin is warm and dry.  Psychiatric: She has a normal mood and affect.  Nursing note and vitals reviewed.    ED Treatments / Results  Labs (all labs ordered are listed, but only abnormal results are displayed) Labs Reviewed  COMPREHENSIVE METABOLIC PANEL  ETHANOL  SALICYLATE LEVEL  ACETAMINOPHEN LEVEL  CBC  RAPID URINE DRUG SCREEN, HOSP PERFORMED  I-STAT BETA HCG BLOOD, ED ([facility], [facility], [facility] ONLY)    EKG  EKG Interpretation None       Radiology No results found.  Procedures Procedures (including critical care time)  Medications Ordered in ED Medications - No data to display   Initial Impression / Assessment and Plan / ED Course  I have reviewed the triage vital signs and the nursing notes.  Pertinent labs & imaging results that were available during my care of the patient were reviewed by me and considered in my medical decision making (see chart for details).    Pt has ivc paperwork.  Labs reviewedand are normal except THC in urine.   TTS consult  Final Clinical Impressions(s) / ED Diagnoses   Final diagnoses:  Psychosis, unspecified psychosis type [facility](HCC)    ED Discharge Orders    None       Osie CheeksSofia, Jace Dowe K, PA-C 12/26/17 2037    Vanetta MuldersZackowski, Scott, MD 12/27/17 0020

## 2017-12-27 DIAGNOSIS — F3163 Bipolar disorder, current episode mixed, severe, without psychotic features: Secondary | ICD-10-CM | POA: Diagnosis not present

## 2017-12-27 DIAGNOSIS — Z811 Family history of alcohol abuse and dependence: Secondary | ICD-10-CM | POA: Diagnosis not present

## 2017-12-27 DIAGNOSIS — Z818 Family history of other mental and behavioral disorders: Secondary | ICD-10-CM | POA: Diagnosis not present

## 2017-12-27 DIAGNOSIS — F419 Anxiety disorder, unspecified: Secondary | ICD-10-CM

## 2017-12-27 DIAGNOSIS — F29 Unspecified psychosis not due to a substance or known physiological condition: Secondary | ICD-10-CM | POA: Diagnosis not present

## 2017-12-27 DIAGNOSIS — Z975 Presence of (intrauterine) contraceptive device: Secondary | ICD-10-CM

## 2017-12-27 DIAGNOSIS — F1290 Cannabis use, unspecified, uncomplicated: Secondary | ICD-10-CM

## 2017-12-27 DIAGNOSIS — R450 Nervousness: Secondary | ICD-10-CM | POA: Diagnosis not present

## 2017-12-27 DIAGNOSIS — F17210 Nicotine dependence, cigarettes, uncomplicated: Secondary | ICD-10-CM | POA: Diagnosis not present

## 2017-12-27 LAB — URINALYSIS, ROUTINE W REFLEX MICROSCOPIC
BILIRUBIN URINE: NEGATIVE
Glucose, UA: NEGATIVE mg/dL
Hgb urine dipstick: NEGATIVE
KETONES UR: NEGATIVE mg/dL
Leukocytes, UA: NEGATIVE
NITRITE: NEGATIVE
PROTEIN: NEGATIVE mg/dL
Specific Gravity, Urine: 1.011 (ref 1.005–1.030)
pH: 7 (ref 5.0–8.0)

## 2017-12-27 MED ORDER — HYDROXYZINE HCL 25 MG PO TABS
50.0000 mg | ORAL_TABLET | Freq: Four times a day (QID) | ORAL | Status: DC | PRN
  Administered 2017-12-28: 50 mg via ORAL
  Filled 2017-12-27: qty 2

## 2017-12-27 MED ORDER — LORAZEPAM 2 MG/ML IJ SOLN
2.0000 mg | Freq: Once | INTRAMUSCULAR | Status: AC | PRN
  Administered 2017-12-27: 2 mg via INTRAMUSCULAR
  Filled 2017-12-27: qty 1

## 2017-12-27 MED ORDER — NICOTINE 7 MG/24HR TD PT24
7.0000 mg | MEDICATED_PATCH | Freq: Every day | TRANSDERMAL | Status: DC
  Administered 2017-12-27: 7 mg via TRANSDERMAL
  Filled 2017-12-27: qty 1

## 2017-12-27 MED ORDER — ZIPRASIDONE MESYLATE 20 MG IM SOLR
10.0000 mg | Freq: Once | INTRAMUSCULAR | Status: AC | PRN
Start: 2017-12-27 — End: 2017-12-27
  Administered 2017-12-27: 10 mg via INTRAMUSCULAR
  Filled 2017-12-27: qty 20

## 2017-12-27 MED ORDER — LORAZEPAM 2 MG/ML IJ SOLN
2.0000 mg | Freq: Once | INTRAMUSCULAR | Status: AC
  Administered 2017-12-27: 2 mg via INTRAMUSCULAR
  Filled 2017-12-27: qty 1

## 2017-12-27 MED ORDER — ZIPRASIDONE MESYLATE 20 MG IM SOLR
10.0000 mg | Freq: Once | INTRAMUSCULAR | Status: AC
  Administered 2017-12-27: 10 mg via INTRAMUSCULAR
  Filled 2017-12-27: qty 20

## 2017-12-27 MED ORDER — IBUPROFEN 200 MG PO TABS
600.0000 mg | ORAL_TABLET | Freq: Four times a day (QID) | ORAL | Status: DC | PRN
Start: 2017-12-27 — End: 2017-12-28
  Administered 2017-12-27: 600 mg via ORAL
  Filled 2017-12-27: qty 3

## 2017-12-27 MED ORDER — LORAZEPAM 1 MG PO TABS
1.0000 mg | ORAL_TABLET | Freq: Four times a day (QID) | ORAL | Status: DC | PRN
  Administered 2017-12-31 (×2): 1 mg via ORAL
  Filled 2017-12-27 (×2): qty 1

## 2017-12-27 MED ORDER — DIPHENHYDRAMINE HCL 50 MG/ML IJ SOLN
50.0000 mg | Freq: Once | INTRAMUSCULAR | Status: AC | PRN
  Administered 2017-12-27: 50 mg via INTRAMUSCULAR
  Filled 2017-12-27: qty 1

## 2017-12-27 MED ORDER — STERILE WATER FOR INJECTION IJ SOLN
INTRAMUSCULAR | Status: AC
  Administered 2017-12-27: 10:00:00
  Filled 2017-12-27: qty 10

## 2017-12-27 MED ORDER — STERILE WATER FOR INJECTION IJ SOLN
INTRAMUSCULAR | Status: AC
  Administered 2017-12-27: 10 mL
  Filled 2017-12-27: qty 10

## 2017-12-27 NOTE — ED Notes (Signed)
Pt has increasing agitation and is loosing rationality. Pt spoke with charge nurse and is still unable to process fact that she will continue to be here through the night and that she is under involuntary commitment.

## 2017-12-27 NOTE — ED Notes (Signed)
Pt repeatedly asking to get into her belongs, Pt requesting to sign self out, Pt asking to be let into the QR because the assigned room is odd numbered and is preventing her from sleeping. Pt has refused all PO medications this shift. Will continue to assess.

## 2017-12-27 NOTE — ED Notes (Signed)
Pastoral care contacted at pt request with quick call back. Spiritual care will be in to see Pt at 8 AM.

## 2017-12-27 NOTE — ED Notes (Signed)
Pt becoming increasingly agitated, anxious, yelling, speech pressured, rapid. Pt pacing, slamming room door. Behavior not redirectable. Jacki ConesLaurie NP aware, awaiting orders,

## 2017-12-27 NOTE — ED Notes (Signed)
SBAR Report received from previous nurse. Pt received agitated and visibly pacing on the unit. on unit. Pt denies current SI/ HI, A/V H, depression, anxiety, or pain at this time, and appears otherwise stable and free of distress. Pt continues with flight of ideas and imaginary thinking. Pt reminded of camera surveillance, q 15 min rounds, and rules of the milieu. Will continue to assess.
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
## 2017-12-27 NOTE — ED Notes (Signed)
Pt requesting to speak with chaplain. This nurse paged chaplain.

## 2017-12-27 NOTE — ED Notes (Signed)
Pt taking a shower 

## 2017-12-27 NOTE — ED Notes (Signed)
Pt awake, yelling, screaming, delusional, pt behavior impossible to redirect, throwing lotion on camera in room. Jacki ConesLaurie NP notified of pt behavior.

## 2017-12-27 NOTE — ED Notes (Signed)
Pt in bed, eyes closed, appears to be sleeping. Breathing non labored, respirations equal. Will continue to monitor.

## 2017-12-28 MED ORDER — IBUPROFEN 200 MG PO TABS
600.0000 mg | ORAL_TABLET | Freq: Four times a day (QID) | ORAL | Status: DC | PRN
  Administered 2017-12-29 – 2017-12-31 (×3): 600 mg via ORAL
  Filled 2017-12-28 (×3): qty 3

## 2017-12-28 MED ORDER — LORAZEPAM 2 MG/ML IJ SOLN
2.0000 mg | Freq: Once | INTRAMUSCULAR | Status: AC
  Administered 2017-12-28: 2 mg via INTRAMUSCULAR
  Filled 2017-12-28: qty 1

## 2017-12-28 MED ORDER — ACETAMINOPHEN 325 MG PO TABS
650.0000 mg | ORAL_TABLET | ORAL | Status: DC | PRN
  Administered 2017-12-30: 650 mg via ORAL
  Filled 2017-12-28: qty 2

## 2017-12-28 MED ORDER — DIPHENHYDRAMINE HCL 50 MG/ML IJ SOLN
50.0000 mg | Freq: Once | INTRAMUSCULAR | Status: AC
  Administered 2017-12-28: 50 mg via INTRAMUSCULAR
  Filled 2017-12-28: qty 1

## 2017-12-28 MED ORDER — STERILE WATER FOR INJECTION IJ SOLN
INTRAMUSCULAR | Status: AC
  Administered 2017-12-28: 16:00:00
  Filled 2017-12-28: qty 10

## 2017-12-28 MED ORDER — NICOTINE POLACRILEX 2 MG MT GUM
2.0000 mg | CHEWING_GUM | OROMUCOSAL | Status: DC | PRN
  Administered 2017-12-28 – 2017-12-30 (×5): 2 mg via ORAL
  Filled 2017-12-28 (×6): qty 1

## 2017-12-28 MED ORDER — ZIPRASIDONE MESYLATE 20 MG IM SOLR
10.0000 mg | Freq: Once | INTRAMUSCULAR | Status: AC
  Administered 2017-12-28: 10 mg via INTRAMUSCULAR
  Filled 2017-12-28: qty 20

## 2017-12-28 NOTE — ED Notes (Signed)
Pt said, "I am a high-functioning borderline." She does not want to be admitted to the hospital again, but does not seem to understand that she needs to take her medication and stop smoking marijuana. She said that she has not smoked marijuana for a month and it is just continues to be in her system. She said that she does not want to take the psychiatric medication. She is pleasant, but restless, anxious and attention seeking. She is also somatic.

## 2017-12-28 NOTE — ED Notes (Signed)
IM medication required to get pt to calm and down. She allowed the medication to be given. She wanted to go into the seclusion room and prior to the medications being prepared she was banging on the seclusion room door, hitting the alarm that is on the outside of the seclusion room door.
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
## 2017-12-28 NOTE — ED Notes (Signed)
Pt is resting calmly now. She opens her eyes when her name is called, allowed her VS to be taken and requested a warm blanket.

## 2017-12-28 NOTE — Progress Notes (Signed)
Per Psychiatrist Akintayo and NP [facility], patient continues to meet inpatient criteria.  CSW faxed patient's referral to: [facility], [facility], [place], [facility], [address], [place], [place] and [facility].  CSW will continue to seek placement for patient.  Celso Sickle, LCSWA [facility]  Clinical Social Worker [phone]

## 2017-12-28 NOTE — ED Notes (Signed)
SBAR Report received from previous nurse. Pt received calm and visible on unit. Pt denies current SI/ HI, A/V H, depression, anxiety, or pain at this time, and appears otherwise stable and free of distress. Pt reminded of camera surveillance, q 15 min rounds, and rules of the milieu. Will continue to assess. 

## 2017-12-28 NOTE — Consult Note (Addendum)
[facility] Consult   Reason for Consult:  Psychotic Referring Physician:  EDP Patient Identification: Sarah Mcclure MRN:  [id number] Principal Diagnosis: Bipolar affective disorder, mixed, severe ([place]) Diagnosis:   Patient Active Problem List   Diagnosis Date Noted  . Bipolar affective disorder, mixed, severe ([place]) [F31.63] 12/27/2017  . Bipolar I disorder, current or most recent episode depressed, with psychotic features ([place]) [F31.5] 12/16/2017  . Polysubstance (including opioids) dependence, daily use ([place]) [F19.20] 12/15/2017  . Substance induced mood disorder ([place]) [F19.94] 12/11/2017  . Polysubstance dependence including opioid type drug, episodic abuse ([place]) [F11.20, F19.20] 12/11/2017  . Breast lump on left side at 6 o'clock position [N63.20] 06/29/2013  . Breast lump on right side at 3 o'clock position [N63.10] 06/29/2013  . Cat allergies [J30.81] 05/25/2012  . Family history of systemic lupus erythematosus [Z82.69] 04/13/2012  . Back pain [M54.9] 04/13/2012  . Pleuritic chest pain [R07.81] 04/13/2012  . Shin splints [F09.323F] 04/13/2012    Total Time spent with patient: 30 minutes  Subjective:   Sarah Mcclure is a 32 y.o. female patient admitted with psychosis due to being off her antipsychotic medications.  HPI:  Pt was seen and chart reviewed with treatment team and Dr Darleene Cleaver. Pt continues to be hyperverbal and hyperactive. Pt is pleasant but is constantly moving about the room. Pt is observed in the milieu, on the telephone, speaking very loudly. Pt's UDS positive for THC, BAL negative. Pt's IVC paperwork states the Pt has been off her psych medications and became manic and was responding to internal stimuli. Pt would benefit from an inpatient psychiatric admission for crisis stabilization and medication management.   Past Psychiatric History: As above  Risk to Self: None Risk to Others: None Prior Inpatient Therapy: Prior Inpatient Therapy:  Yes Prior Therapy Dates: 2012, 2019 Prior Therapy Facilty/Provider(s): [facility] and [facility] Reason for Treatment: Overdose, AVH, SI. Prior Outpatient Therapy: Prior Outpatient Therapy: Yes Prior Therapy Dates: 12/2017. Prior Therapy Facilty/Provider(s): [facility] Reason for Treatment: Medication management and counseling.  Does patient have an ACCT team?: No Does patient have Intensive In-House Services?  : No Does patient have [facility] services? : Yes Does patient have P4CC services?: No  Past Medical History:  Past Medical History:  Diagnosis Date  . Allergy   . Asthma    As a child  . Chronic back pain greater than 3 months duration 2013  . Depression   . Fibromyalgia    History reviewed. No pertinent surgical history. Family History:  Family History  Problem Relation Age of Onset  . Alcohol abuse Mother   . Arthritis Mother   . Lupus Father   . Alcohol abuse Father   . Heart disease Father   . Depression Father   . Heart disease Maternal Grandfather    Family Psychiatric  History: Unknown Social History:  Social History   Substance and Sexual Activity  Alcohol Use No     Social History   Substance and Sexual Activity  Drug Use Yes  . Types: Marijuana, Other-see comments   Comment: molly    Social History   Socioeconomic History  . Marital status: Single    Spouse name: None  . Number of children: None  . Years of education: None  . Highest education level: None  Social Needs  . Financial resource strain: None  . Food insecurity - worry: None  . Food insecurity - inability: None  . Transportation needs - medical: None  . Transportation  needs - non-medical: None  Occupational History  . None  Tobacco Use  . Smoking status: Current Some Day Smoker    Types: Cigarettes  . Smokeless tobacco: Never Used  Substance and Sexual Activity  . Alcohol use: No  . Drug use: Yes    Types: Marijuana, Other-see comments    Comment: molly  . Sexual activity:  Yes    Birth control/protection: IUD  Other Topics Concern  . None  Social History Narrative  . None   Additional Social History:    Allergies:   Allergies  Allergen Reactions  . Amoxicillin Other (See Comments)    Has patient had a PCN reaction causing immediate rash, facial/tongue/throat swelling, SOB or lightheadedness with hypotension: Unknown Has patient had a PCN reaction causing severe rash involving mucus membranes or skin necrosis: No Has patient had a PCN reaction that required hospitalization: Unknown Has patient had a PCN reaction occurring within the last 10 years: No If all of the above answers are "NO", then may proceed with Cephalosporin use.   Marland Kitchen Penicillins Other (See Comments)    Has patient had a PCN reaction causing immediate rash, facial/tongue/throat swelling, SOB or lightheadedness with hypotension: Unknown Has patient had a PCN reaction causing severe rash involving mucus membranes or skin necrosis: No Has patient had a PCN reaction that required hospitalization: Unknown Has patient had a PCN reaction occurring within the last 10 years: No If all of the above answers are "NO", then may proceed with Cephalosporin use.     Labs:  Results for orders placed or performed during the hospital encounter of 12/26/17 (from the past 48 hour(s))  Rapid urine drug screen (hospital performed)     Status: Abnormal   Collection Time: 12/26/17  5:42 PM  Result Value Ref Range   Opiates NONE DETECTED NONE DETECTED   Cocaine NONE DETECTED NONE DETECTED   Benzodiazepines NONE DETECTED NONE DETECTED   Amphetamines NONE DETECTED NONE DETECTED   Tetrahydrocannabinol POSITIVE (A) NONE DETECTED   Barbiturates NONE DETECTED NONE DETECTED    Comment: (NOTE) DRUG SCREEN FOR MEDICAL PURPOSES ONLY.  IF CONFIRMATION IS NEEDED FOR ANY PURPOSE, NOTIFY LAB WITHIN 5 DAYS. LOWEST DETECTABLE LIMITS FOR URINE DRUG SCREEN Drug Class                     Cutoff (ng/mL) Amphetamine and  metabolites    1000 Barbiturate and metabolites    200 Benzodiazepine                 937 Tricyclics and metabolites     300 Opiates and metabolites        300 Cocaine and metabolites        300 THC                            50 Performed at [facility], [place] [address] [place],  [address]   Urinalysis, Routine w reflex microscopic     Status: Abnormal   Collection Time: 12/26/17  5:42 PM  Result Value Ref Range   Color, Urine YELLOW YELLOW   APPearance HAZY (A) CLEAR   Specific Gravity, Urine 1.011 1.005 - 1.030   pH 7.0 5.0 - 8.0   Glucose, UA NEGATIVE NEGATIVE mg/dL   Hgb urine dipstick NEGATIVE NEGATIVE   Bilirubin Urine NEGATIVE NEGATIVE   Ketones, ur NEGATIVE NEGATIVE mg/dL   Protein, ur NEGATIVE NEGATIVE mg/dL  Nitrite NEGATIVE NEGATIVE   Leukocytes, UA NEGATIVE NEGATIVE    Comment: Performed at [facility] [address], [place], [place] [address]  Comprehensive metabolic panel     Status: None   Collection Time: 12/26/17  6:17 PM  Result Value Ref Range   Sodium 139 135 - 145 mmol/L   Potassium 3.9 3.5 - 5.1 mmol/L   Chloride 106 101 - 111 mmol/L   CO2 25 22 - 32 mmol/L   Glucose, Bld 89 65 - 99 mg/dL   BUN 11 6 - 20 mg/dL   Creatinine, Ser 0.61 0.44 - 1.00 mg/dL   Calcium 9.3 8.9 - 10.3 mg/dL   Total Protein 6.8 6.5 - 8.1 g/dL   Albumin 4.1 3.5 - 5.0 g/dL   AST 20 15 - 41 U/L   ALT 26 14 - 54 U/L   Alkaline Phosphatase 66 38 - 126 U/L   Total Bilirubin 0.4 0.3 - 1.2 mg/dL   GFR calc non Af Amer >60 >60 mL/min   GFR calc Af Amer >60 >60 mL/min    Comment: (NOTE) The eGFR has been calculated using the CKD EPI equation. This calculation has not been validated in all clinical situations. eGFR's persistently <60 mL/min signify possible Chronic Kidney Disease.    Anion gap 8 5 - 15    Comment: Performed at [facility], [place] [address], [place], [place] [address]  Ethanol     Status: None    Collection Time: 12/26/17  6:17 PM  Result Value Ref Range   Alcohol, Ethyl (B) <10 <10 mg/dL    Comment:        LOWEST DETECTABLE LIMIT FOR SERUM ALCOHOL IS 10 mg/dL FOR MEDICAL PURPOSES ONLY Performed at [facility], [place] [address] [place], [place] [address]   Salicylate level     Status: None   Collection Time: 12/26/17  6:17 PM  Result Value Ref Range   Salicylate Lvl <6.1 2.8 - 30.0 mg/dL    Comment: Performed at [facility], [place] [address], [place], [place] [address]  Acetaminophen level     Status: Abnormal   Collection Time: 12/26/17  6:17 PM  Result Value Ref Range   Acetaminophen (Tylenol), Serum <10 (L) 10 - 30 ug/mL    Comment:        THERAPEUTIC CONCENTRATIONS VARY SIGNIFICANTLY. A RANGE OF 10-30 ug/mL MAY BE AN EFFECTIVE CONCENTRATION FOR MANY PATIENTS. HOWEVER, SOME ARE BEST TREATED AT CONCENTRATIONS OUTSIDE THIS RANGE. ACETAMINOPHEN CONCENTRATIONS >150 ug/mL AT 4 HOURS AFTER INGESTION AND >50 ug/mL AT 12 HOURS AFTER INGESTION ARE OFTEN ASSOCIATED WITH TOXIC REACTIONS. Performed at [facility], [place] [address], [place], [place] [address]   cbc     Status: Abnormal   Collection Time: 12/26/17  6:17 PM  Result Value Ref Range   WBC 10.9 (H) 4.0 - 10.5 K/uL   RBC 4.42 3.87 - 5.11 MIL/uL   Hemoglobin 13.2 12.0 - 15.0 g/dL   HCT 38.6 36.0 - 46.0 %   MCV 87.3 78.0 - 100.0 fL   MCH 29.9 26.0 - 34.0 pg   MCHC 34.2 30.0 - 36.0 g/dL   RDW 12.8 11.5 - 15.5 %   Platelets 324 150 - 400 K/uL    Comment: Performed at [facility], [place] [address] [place],  [address]  I-Stat beta hCG blood, ED     Status: None   Collection Time: 12/26/17  6:24 PM  Result Value Ref Range   I-stat hCG, quantitative <5.0 <5 mIU/mL   Comment 3            Comment:   GEST. AGE      CONC.  (mIU/mL)   <=1 WEEK        5 - 50     2 WEEKS       50 - 500     3 WEEKS       100 - 10,000     4 WEEKS     1,000  - 30,000        FEMALE AND NON-PREGNANT FEMALE:     LESS THAN 5 mIU/mL     Current Facility-Administered Medications  Medication Dose Route Frequency Provider Last Rate Last Dose  . acetaminophen (TYLENOL) tablet 650 mg  650 mg Oral Q4H PRN Fredia Sorrow, MD      . alum & mag hydroxide-simeth (MAALOX/MYLANTA) 200-200-20 MG/5ML suspension 30 mL  30 mL Oral Q6H PRN Caryl Ada K, PA-C      . ARIPiprazole (ABILIFY) tablet 10 mg  10 mg Oral Daily Caryl Ada K, PA-C   10 mg at 12/28/17 1275  . gabapentin (NEURONTIN) capsule 300 mg  300 mg Oral TID Sofia, Leslie K, PA-C   300 mg at 12/28/17 1700  . hydrOXYzine (ATARAX/VISTARIL) tablet 50 mg  50 mg Oral Q6H PRN Fredia Sorrow, MD   50 mg at 12/28/17 1111  . ibuprofen (ADVIL,MOTRIN) tablet 600 mg  600 mg Oral Q6H PRN Fredia Sorrow, MD      . LORazepam (ATIVAN) tablet 1 mg  1 mg Oral Q6H PRN Fredia Sorrow, MD      . nicotine polacrilex (NICORETTE) gum 2 mg  2 mg Oral PRN Jinny Blossom   2 mg at 12/28/17 0914  . ondansetron (ZOFRAN) tablet 4 mg  4 mg Oral Q8H PRN Sofia, Leslie K, PA-C      . zolpidem (AMBIEN) tablet 5 mg  5 mg Oral QHS PRN Fransico Meadow, PA-C       Current Outpatient Medications  Medication Sig Dispense Refill  . ARIPiprazole (ABILIFY) 10 MG tablet Take 1 tablet (10 mg total) by mouth daily. For mood control 30 tablet 0  . gabapentin (NEURONTIN) 300 MG capsule Take 1 capsule (300 mg total) by mouth 3 (three) times daily. For agitation 90 capsule 0  . hydrOXYzine (ATARAX/VISTARIL) 50 MG tablet Take 1 tablet (50 mg total) by mouth every 6 (six) hours as needed for anxiety. 75 tablet 0  . traZODone (DESYREL) 50 MG tablet Take 1 tablet (50 mg total) by mouth at bedtime. For sleep 30 tablet 0    Musculoskeletal: Strength & Muscle Tone: within normal limits Gait & Station: normal Patient leans: N/A  Psychiatric Specialty Exam: Physical Exam  Constitutional: She appears well-developed and well-nourished.  HENT:   Head: Normocephalic.  Respiratory: Effort normal.  Musculoskeletal: Normal range of motion.  Neurological: She is alert.  Psychiatric: Thought content normal. Her mood appears anxious. Her affect is labile. Her speech is rapid and/or pressured. She is hyperactive. Cognition and memory are normal. She expresses impulsivity.    Review of Systems  Psychiatric/Behavioral: Negative for depression, hallucinations, memory loss, substance abuse and suicidal ideas. The patient is nervous/anxious. The patient does not have insomnia.   All other systems reviewed and are negative.   Blood pressure 117/80, pulse 89, temperature 98.1 F (36.7 C), temperature source Oral, resp. rate 18, SpO2 100 %.There is no  height or weight on file to calculate BMI.  General Appearance: Casual  Eye Contact:  Good  Speech:  Pressured  Volume:  Normal  Mood:  Anxious  Affect:  Labile  Thought Process:  Disorganized  Orientation:  Full (Time, Place, and Person)  Thought Content:  Illogical, Rumination and Tangential  Suicidal Thoughts:  No  Homicidal Thoughts:  No  Memory:  Immediate;   Good Recent;   Fair Remote;   Fair  Judgement:  Poor  Insight:  Lacking  Psychomotor Activity:  Increased  Concentration:  Concentration: Fair and Attention Span: Fair  Recall:  [employer] of Knowledge:  Good  Language:  Good  Akathisia:  No  Handed:  Right  AIMS (if indicated):     Assets:  Agricultural consultant Housing Physical Health Social Support  ADL's:  Intact  Cognition:  WNL  Sleep:        Treatment Plan Summary: Daily contact with patient to assess and evaluate symptoms and progress in treatment and Medication management (see MAR)  Disposition: Recommend psychiatric Inpatient admission when medically cleared. TTS to seek placement  Ethelene Hal, NP 12/28/2017 1:33 PM  Patient seen face-to-face for psychiatric evaluation, chart reviewed and case discussed with the  physician extender and developed treatment plan. Reviewed the information documented and agree with the treatment plan. Corena Pilgrim, MD

## 2017-12-29 MED ORDER — STERILE WATER FOR INJECTION IJ SOLN
INTRAMUSCULAR | Status: AC
  Administered 2017-12-29: 09:00:00
  Filled 2017-12-29: qty 10

## 2017-12-29 MED ORDER — STERILE WATER FOR INJECTION IJ SOLN
INTRAMUSCULAR | Status: AC
  Administered 2017-12-29: 10 mL
  Filled 2017-12-29: qty 10

## 2017-12-29 MED ORDER — STERILE WATER FOR INJECTION IJ SOLN
INTRAMUSCULAR | Status: AC
  Administered 2017-12-29: 13:00:00
  Filled 2017-12-29: qty 10

## 2017-12-29 MED ORDER — STERILE WATER FOR INJECTION IJ SOLN
INTRAMUSCULAR | Status: AC
  Filled 2017-12-29: qty 10

## 2017-12-29 MED ORDER — ZIPRASIDONE MESYLATE 20 MG IM SOLR
10.0000 mg | Freq: Once | INTRAMUSCULAR | Status: AC
  Administered 2017-12-29: 10 mg via INTRAMUSCULAR
  Filled 2017-12-29: qty 20

## 2017-12-29 MED ORDER — DIPHENHYDRAMINE HCL 50 MG/ML IJ SOLN
50.0000 mg | Freq: Once | INTRAMUSCULAR | Status: AC
  Administered 2017-12-29: 50 mg via INTRAMUSCULAR
  Filled 2017-12-29: qty 1

## 2017-12-29 MED ORDER — OLANZAPINE 10 MG IM SOLR
10.0000 mg | Freq: Once | INTRAMUSCULAR | Status: DC
  Filled 2017-12-29: qty 10

## 2017-12-29 MED ORDER — LORAZEPAM 2 MG/ML IJ SOLN
2.0000 mg | Freq: Once | INTRAMUSCULAR | Status: AC
  Administered 2017-12-29: 2 mg via INTRAMUSCULAR
  Filled 2017-12-29: qty 1

## 2017-12-29 MED ORDER — LORAZEPAM 2 MG/ML IJ SOLN
2.0000 mg | Freq: Once | INTRAMUSCULAR | Status: AC
Start: 2017-12-29 — End: 2017-12-29
  Administered 2017-12-29: 2 mg via INTRAMUSCULAR
  Filled 2017-12-29: qty 1

## 2017-12-29 NOTE — Consult Note (Addendum)
[facility]   Reason for Consult:  Psychotic Referring Physician:  EDP Patient Identification: Sarah MurphyLaura M Mcclure MRN:  [id number] Principal Diagnosis: Bipolar affective disorder, mixed, severe ([facility]) Diagnosis:   Patient Active Problem List   Diagnosis Date Noted  . Bipolar affective disorder, mixed, severe ([facility]) [F31.63] 12/27/2017  . Bipolar I disorder, current or most recent episode depressed, with psychotic features ([facility]) [F31.5] 12/16/2017  . Polysubstance (including opioids) dependence, daily use ([facility]) [F19.20] 12/15/2017  . Substance induced mood disorder ([facility]) [F19.94] 12/11/2017  . Polysubstance dependence including opioid type drug, episodic abuse ([facility]) [F11.20, F19.20] 12/11/2017  . Breast lump on left side at 6 o'clock position [N63.20] 06/29/2013  . Breast lump on right side at 3 o'clock position [N63.10] 06/29/2013  . Cat allergies [J30.81] 05/25/2012  . Family history of systemic lupus erythematosus [Z82.69] 04/13/2012  . Back pain [M54.9] 04/13/2012  . Pleuritic chest pain [R07.81] 04/13/2012  . Shin splints [N82.956O][S86.899A] 04/13/2012    Total Time spent with patient: 30 minutes  Subjective:   Sarah MurphyLaura M Mcclure is a 32 y.o. female patient admitted with psychosis due to being off her antipsychotic medications.  HPI:  Pt was seen and chart reviewed with treatment team and Dr Sharma CovertNorman. Pt continues to be hyperverbal, disorganized, and hyperactive. Pt is confrontational with staff and security and is running toward locked doors and hitting them. Pt is in  constant motion when she is awake. about the room. Pt is observed in the milieu, on the telephone, speaking very loudly. Pt's UDS positive for THC, BAL negative. Pt's IVC paperwork states the Pt has been off her psych medications and became manic and was responding to internal stimuli. Pt has been refusing her Abilify and Gabapentin and stated that they cause psychosis. Pt would benefit from an inpatient psychiatric  admission for crisis stabilization and medication management.   Past Psychiatric History: As above  Risk to Self: None Risk to Others: None Prior Inpatient Therapy: Prior Inpatient Therapy: Yes Prior Therapy Dates: 2012, 2019 [facility] Reason for Treatment: Overdose, AVH, SI. Prior Outpatient Therapy: Prior Outpatient Therapy: Yes Prior Therapy Dates: 12/2017. Prior Therapy Facilty/Provider(s): [facility] Reason for Treatment: Medication management and counseling.  Does patient have an ACCT team?: No Does patient have Intensive In-House Services?  : No Does patient have [facility] services? : Yes Does patient have P4CC services?: No  Past Medical History:  Past Medical History:  Diagnosis Date  . Allergy   . Asthma    As a child  . Chronic back pain greater than 3 months duration 2013  . Depression   . Fibromyalgia    History reviewed. No pertinent surgical history. Family History:  Family History  Problem Relation Age of Onset  . Alcohol abuse Mother   . Arthritis Mother   . Lupus Father   . Alcohol abuse Father   . Heart disease Father   . Depression Father   . Heart disease Maternal Grandfather    Family Psychiatric  History: Unknown Social History:  Social History   Substance and Sexual Activity  Alcohol Use No     Social History   Substance and Sexual Activity  Drug Use Yes  . Types: Marijuana, Other-see comments   Comment: molly    Social History   Socioeconomic History  . Marital status: Single    Spouse name: None  . Number of children: None  . Years of education: None  . Highest education level:  None  Social Needs  . Financial resource strain: None  . Food insecurity - worry: None  . Food insecurity - inability: None  . Transportation needs - medical: None  . Transportation needs - non-medical: None  Occupational History  . None  Tobacco Use  . Smoking status: Current Some Day Smoker    Types:  Cigarettes  . Smokeless tobacco: Never Used  Substance and Sexual Activity  . Alcohol use: No  . Drug use: Yes    Types: Marijuana, Other-see comments    Comment: molly  . Sexual activity: Yes    Birth control/protection: IUD  Other Topics Concern  . None  Social History Narrative  . None   Additional Social History: N/A    Allergies:   Allergies  Allergen Reactions  . Amoxicillin Other (See Comments)    Has patient had a PCN reaction causing immediate rash, facial/tongue/throat swelling, SOB or lightheadedness with hypotension: Unknown Has patient had a PCN reaction causing severe rash involving mucus membranes or skin necrosis: No Has patient had a PCN reaction that required hospitalization: Unknown Has patient had a PCN reaction occurring within the last 10 years: No If all of the above answers are "NO", then may proceed with Cephalosporin use.   Marland Kitchen Penicillins Other (See Comments)    Has patient had a PCN reaction causing immediate rash, facial/tongue/throat swelling, SOB or lightheadedness with hypotension: Unknown Has patient had a PCN reaction causing severe rash involving mucus membranes or skin necrosis: No Has patient had a PCN reaction that required hospitalization: Unknown Has patient had a PCN reaction occurring within the last 10 years: No If all of the above answers are "NO", then may proceed with Cephalosporin use.     Labs:  No results found for this or any previous visit (from the past 48 hour(s)).  Current Facility-Administered Medications  Medication Dose Route Frequency Provider Last Rate Last Dose  . acetaminophen (TYLENOL) tablet 650 mg  650 mg Oral Q4H PRN Vanetta Mulders, MD      . alum & mag hydroxide-simeth (MAALOX/MYLANTA) 200-200-20 MG/5ML suspension 30 mL  30 mL Oral Q6H PRN Cheron Schaumann K, PA-C      . ARIPiprazole (ABILIFY) tablet 10 mg  10 mg Oral Daily Cheron Schaumann K, PA-C   10 mg at 12/28/17 1610  . gabapentin (NEURONTIN) capsule 300 mg   300 mg Oral TID Sofia, Leslie K, PA-C   300 mg at 12/28/17 9604  . hydrOXYzine (ATARAX/VISTARIL) tablet 50 mg  50 mg Oral Q6H PRN Vanetta Mulders, MD   50 mg at 12/28/17 1111  . ibuprofen (ADVIL,MOTRIN) tablet 600 mg  600 mg Oral Q6H PRN Vanetta Mulders, MD      . LORazepam (ATIVAN) tablet 1 mg  1 mg Oral Q6H PRN Vanetta Mulders, MD      . nicotine polacrilex (NICORETTE) gum 2 mg  2 mg Oral PRN Vanetta Mulders, MD   2 mg at 12/28/17 2225  . OLANZapine (ZYPREXA) injection 10 mg  10 mg Intramuscular Once Laveda Abbe, NP   Stopped at 12/29/17 1319  . ondansetron (ZOFRAN) tablet 4 mg  4 mg Oral Q8H PRN Cheron Schaumann K, [place]      . sterile water (preservative free) injection        Stopped at 12/29/17 1319  . zolpidem (AMBIEN) tablet 5 mg  5 mg Oral QHS PRN Elson Areas, PA-C       Current Outpatient Medications  Medication Sig Dispense Refill  .  ARIPiprazole (ABILIFY) 10 MG tablet Take 1 tablet (10 mg total) by mouth daily. For mood control 30 tablet 0  . gabapentin (NEURONTIN) 300 MG capsule Take 1 capsule (300 mg total) by mouth 3 (three) times daily. For agitation 90 capsule 0  . hydrOXYzine (ATARAX/VISTARIL) 50 MG tablet Take 1 tablet (50 mg total) by mouth every 6 (six) hours as needed for anxiety. 75 tablet 0  . traZODone (DESYREL) 50 MG tablet Take 1 tablet (50 mg total) by mouth at bedtime. For sleep 30 tablet 0    Musculoskeletal: Strength & Muscle Tone: within normal limits Gait & Station: normal Patient leans: N/A  Psychiatric Specialty Exam: Physical Exam  Nursing note and vitals reviewed. Constitutional: She appears well-developed and well-nourished.  HENT:  Head: Normocephalic and atraumatic.  Neck: Normal range of motion.  Respiratory: Effort normal.  Musculoskeletal: Normal range of motion.  Neurological: She is alert.  Psychiatric: Thought content normal. Her mood appears anxious. Her affect is labile. Her speech is rapid and/or pressured. She is  hyperactive. Cognition and memory are normal. She expresses impulsivity.    Review of Systems  Psychiatric/Behavioral: Negative for depression, hallucinations, memory loss, substance abuse and suicidal ideas. The patient is nervous/anxious. The patient does not have insomnia.   All other systems reviewed and are negative.   Blood pressure 114/66, pulse 86, temperature 97.8 F (36.6 C), temperature source Oral, resp. rate 16, SpO2 100 %.There is no height or weight on file to calculate BMI.  General Appearance: Casual  Eye Contact:  Good  Speech:  Pressured  Volume:  Normal  Mood:  Anxious  Affect:  Labile  Thought Process:  Disorganized  Orientation:  Full (Time, Place, and Person)  Thought Content:  Illogical, Rumination and Tangential  Suicidal Thoughts:  No  Homicidal Thoughts:  No  Memory:  Immediate;   Good Recent;   Fair Remote;   Fair  Judgement:  Poor  Insight:  Lacking  Psychomotor Activity:  Increased  Concentration:  Concentration: Fair and Attention Span: Fair  Recall:  [employer] of Knowledge:  Good  Language:  Good  Akathisia:  No  Handed:  Right  AIMS (if indicated):   N/A  Assets:  Communication Skills Physical Health Social Support  ADL's:  Intact  Cognition:  WNL  Sleep:   N/A     Treatment Plan Summary: Daily contact with patient to assess and evaluate symptoms and progress in treatment and Medication management (see MAR)  Disposition: Recommend psychiatric Inpatient admission when medically cleared. TTS to seek placement  Laveda Abbe, NP 12/29/2017 2:46 PM   Patient seen face-to-face for psychiatric evaluation, chart reviewed and case discussed with the physician extender and developed treatment plan. Reviewed the information documented and agree with the treatment plan.  Juanetta Beets, DO 12/29/17 6:02 PM

## 2017-12-29 NOTE — ED Notes (Signed)
Visitor at bedside.

## 2017-12-29 NOTE — ED Notes (Signed)
Pt caught going into another pt room after re-dircted pt went into an empty room and was re-directed back to her own room again.

## 2017-12-29 NOTE — ED Notes (Signed)
Pt running down unit halls, kicking doors, flickering light switches. Pt yelling, screaming, slamming room door.

## 2017-12-29 NOTE — ED Notes (Signed)
This Clinical research associatewriter witnessed pt verbally Therapist, occupationalthreaten RN, Morrie SheldonAshley. Pt requested to speak with charge nurse. RN Morrie SheldonAshley let pt know that she is the charge nurse and asked pt how she could help her. Pt would not discuss with RN her problems and then requested RN to come speak with her in her room. RN let pt know she would meet pt in room in a few moments. Pt stated "Will this be after you finish eating? If so, I'll make this hell." Pt then walked to double doors (entrance of unit) and stood looking through crack. Pt pacing hallway. Pt appears to be agitated.

## 2017-12-29 NOTE — ED Notes (Signed)
SBAR Report received from previous nurse. Pt received calm and visible on unit. Pt denies current SI/ HI, A/V H, depression, anxiety, or pain at this time, and appears otherwise stable and free of distress. Pt reminded of camera surveillance, q 15 min rounds, and rules of the milieu. Will continue to assess. 

## 2017-12-29 NOTE — ED Notes (Signed)
Pt behavior becoming increasingly agitated, pt demanding, argumentative, verbally aggressive, laurie np made aware.

## 2017-12-29 NOTE — BH Assessment (Signed)
[facility]  Per Juanetta BeetsJacqueline Norman, DO, this pt requires psychiatric hospitalization at this time.  Pt [facility], and upheld by Thedore MinsMojeed Akintayo, MD.  The following facilities have been contacted to seek placement for this pt, with results as noted:  Beds available, information sent, decision pending:  [facility]   Clydell Alberts, [place] Behavioral Health Coordinator [phone]

## 2017-12-29 NOTE — ED Notes (Addendum)
Pt in room yelling, screaming, tearful, pacing. Encouragement and support provided. Pt behavior not redirectable, Jacki ConesLaurie NP made aware.

## 2017-12-30 DIAGNOSIS — F17210 Nicotine dependence, cigarettes, uncomplicated: Secondary | ICD-10-CM | POA: Diagnosis not present

## 2017-12-30 DIAGNOSIS — F1290 Cannabis use, unspecified, uncomplicated: Secondary | ICD-10-CM | POA: Diagnosis not present

## 2017-12-30 DIAGNOSIS — F3163 Bipolar disorder, current episode mixed, severe, without psychotic features: Secondary | ICD-10-CM | POA: Diagnosis not present

## 2017-12-30 DIAGNOSIS — Z818 Family history of other mental and behavioral disorders: Secondary | ICD-10-CM | POA: Diagnosis not present

## 2017-12-30 DIAGNOSIS — Z975 Presence of (intrauterine) contraceptive device: Secondary | ICD-10-CM | POA: Diagnosis not present

## 2017-12-30 DIAGNOSIS — F29 Unspecified psychosis not due to a substance or known physiological condition: Secondary | ICD-10-CM | POA: Diagnosis not present

## 2017-12-30 DIAGNOSIS — R450 Nervousness: Secondary | ICD-10-CM | POA: Diagnosis not present

## 2017-12-30 DIAGNOSIS — F419 Anxiety disorder, unspecified: Secondary | ICD-10-CM | POA: Diagnosis not present

## 2017-12-30 DIAGNOSIS — Z811 Family history of alcohol abuse and dependence: Secondary | ICD-10-CM | POA: Diagnosis not present

## 2017-12-30 MED ORDER — LORAZEPAM 2 MG/ML IJ SOLN
2.0000 mg | Freq: Once | INTRAMUSCULAR | Status: AC
  Administered 2017-12-30: 2 mg via INTRAMUSCULAR
  Filled 2017-12-30: qty 1

## 2017-12-30 MED ORDER — STERILE WATER FOR INJECTION IJ SOLN
INTRAMUSCULAR | Status: AC
  Administered 2017-12-30: 1.2 mL
  Filled 2017-12-30: qty 10

## 2017-12-30 MED ORDER — ZIPRASIDONE MESYLATE 20 MG IM SOLR
10.0000 mg | Freq: Once | INTRAMUSCULAR | Status: AC
  Administered 2017-12-30: 10 mg via INTRAMUSCULAR
  Filled 2017-12-30: qty 20

## 2017-12-30 MED ORDER — STERILE WATER FOR INJECTION IJ SOLN
INTRAMUSCULAR | Status: AC
  Administered 2017-12-30: 13:00:00
  Filled 2017-12-30: qty 10

## 2017-12-30 MED ORDER — ZIPRASIDONE MESYLATE 20 MG IM SOLR
20.0000 mg | Freq: Once | INTRAMUSCULAR | Status: AC
  Administered 2017-12-30: 20 mg via INTRAMUSCULAR
  Filled 2017-12-30: qty 20

## 2017-12-30 MED ORDER — DIPHENHYDRAMINE HCL 50 MG/ML IJ SOLN
50.0000 mg | Freq: Once | INTRAMUSCULAR | Status: AC
  Administered 2017-12-30: 50 mg via INTRAMUSCULAR
  Filled 2017-12-30: qty 1

## 2017-12-30 MED ORDER — HYDROCORTISONE 1 % EX CREA
TOPICAL_CREAM | Freq: Two times a day (BID) | CUTANEOUS | Status: DC
  Administered 2017-12-30 – 2018-01-01 (×5): via TOPICAL
  Filled 2017-12-30: qty 28

## 2017-12-30 MED ORDER — DIPHENHYDRAMINE HCL 25 MG PO CAPS
25.0000 mg | ORAL_CAPSULE | Freq: Once | ORAL | Status: AC
  Administered 2017-12-30: 25 mg via ORAL
  Filled 2017-12-30: qty 1

## 2017-12-30 NOTE — ED Notes (Signed)
Pt ate 60% of breakfast

## 2017-12-30 NOTE — ED Notes (Signed)
Jacki ConesLaurie NP made aware of small red rash on pt hands and benadryl administration.

## 2017-12-30 NOTE — ED Notes (Signed)
Pt asked if she was not hungry as she hadn't eaten anything off her breakfast tray but it was in disarray. Pt was prompted to try and eat some of breakfast. Pt stated, "I should eat something shouldn't I?" Pt was prompted to focus on one thing at a time as pt appeared scattered. Pt stated "I am having trouble concentrating. I should write that down to tell the doctor."

## 2017-12-30 NOTE — ED Notes (Signed)
Pt given 2nd set of clean scrubs for morning as she ripped scrub top. Pt was prompted that she needed to keep scrub top on when in hallway. Pt appeared to understand.

## 2017-12-30 NOTE — ED Notes (Signed)
Patient punching walls, yelling and screaming "I won't to get out here now". Patient pulling code blue alarm. Writer and other staff tried to verbally deescalate patient with no success. Writer tried to enter into a therapeutic discussion patient,but ws unable to redirect patient. Encouragement and support provided and safety maintain. Q 15 min safety checks remain in place.

## 2017-12-30 NOTE — BH Assessment (Signed)
[facility]  Per Juanetta BeetsJacqueline Norman, DO, this pt continues to require psychiatric hospitalization at this time.  The following facilities have been contacted to seek placement for this pt, with results as noted:  Beds available, information sent, decision pending:  [facility]   At [facility]:  [facility]  Jakeim Sedore, [place] Behavioral Health Coordinator [phone]

## 2017-12-30 NOTE — ED Notes (Signed)
IM injection given to patient without resistance with security and [employer] stand by assistance. Encouragement and support provided and safety maintain. Q 15 min safety checks remain in place and video monitoring.

## 2017-12-30 NOTE — Consult Note (Addendum)
[facility] Face-to-Face Psychiatry Consult   Reason for Consult:  Psychotic Referring Physician:  EDP Patient Identification: Sarah Mcclure MRN:  [id number] Principal Diagnosis: Bipolar affective disorder, mixed, severe (HCC) Diagnosis:   Patient Active Problem List   Diagnosis Date Noted  . Psychosis (HCC) [F29]   . Bipolar affective disorder, mixed, severe (HCC) [F31.63] 12/27/2017  . Bipolar I disorder, current or most recent episode depressed, with psychotic features (HCC) [F31.5] 12/16/2017  . Polysubstance (including opioids) dependence, daily use (HCC) [F19.20] 12/15/2017  . Substance induced mood disorder (HCC) [F19.94] 12/11/2017  . Polysubstance dependence including opioid type drug, episodic abuse (HCC) [F11.20, F19.20] 12/11/2017  . Breast lump on left side at 6 o'clock position [N63.20] 06/29/2013  . Breast lump on right side at 3 o'clock position [N63.10] 06/29/2013  . Cat allergies [J30.81] 05/25/2012  . Family history of systemic lupus erythematosus [Z82.69] 04/13/2012  . Back pain [M54.9] 04/13/2012  . Pleuritic chest pain [R07.81] 04/13/2012  . Shin splints [X91.478G] 04/13/2012    Total Time spent with patient: 30 minutes  Subjective:   Sarah Mcclure is a 32 y.o. female patient admitted with psychosis due to being off her antipsychotic medications.  HPI:  Pt was seen and chart reviewed with treatment team and Dr Sharma Covert. Pt continues to be hyperverbal, disorganized, and hyperactive. Pt is confrontational with staff and security and is running toward locked doors and hitting them. Pt is in constant motion when she is awake. Pt is observed in the milieu, on the telephone, speaking very loudly. Pt repeatedly requires emergency medications for stabilization. Pt's UDS positive for THC, BAL negative. Pt's IVC paperwork states the Pt has been off her psych medications and became manic and was responding to internal stimuli. Pt has been refusing her Abilify and Gabapentin and stated  that they cause psychosis. Pt would benefit from an inpatient psychiatric admission for crisis stabilization and medication management.   Past Psychiatric History: As above  Risk to Self: None Risk to Others: None Prior Inpatient Therapy: Prior Inpatient Therapy: Yes Prior Therapy Dates: 2012, 2019 Prior Therapy Facilty/Provider([facility]): [facility] Reason for Treatment: Overdose, AVH, SI. Prior Outpatient Therapy: Prior Outpatient Therapy: Yes Prior Therapy Dates: 12/2017. Prior Therapy Facilty/Provider(s): [facility] Reason for Treatment: Medication management and counseling.  Does patient have an ACCT team?: No Does patient have Intensive In-House Services?  : No Does patient have [facility] services? : Yes Does patient have P4CC services?: No  Past Medical History:  Past Medical History:  Diagnosis Date  . Allergy   . Asthma    As a child  . Chronic back pain greater than 3 months duration 2013  . Depression   . Fibromyalgia    History reviewed. No pertinent surgical history. Family History:  Family History  Problem Relation Age of Onset  . Alcohol abuse Mother   . Arthritis Mother   . Lupus Father   . Alcohol abuse Father   . Heart disease Father   . Depression Father   . Heart disease Maternal Grandfather    Family Psychiatric  History: As listed above. Social History:  Social History   Substance and Sexual Activity  Alcohol Use No     Social History   Substance and Sexual Activity  Drug Use Yes  . Types: Marijuana, Other-see comments   Comment: molly    Social History   Socioeconomic History  . Marital status: Single    Spouse name: None  . Number of children: None  .  Years of education: None  . Highest education level: None  Social Needs  . Financial resource strain: None  . Food insecurity - worry: None  . Food insecurity - inability: None  . Transportation needs - medical: None  . Transportation needs - non-medical: None  Occupational  History  . None  Tobacco Use  . Smoking status: Current Some Day Smoker    Types: Cigarettes  . Smokeless tobacco: Never Used  Substance and Sexual Activity  . Alcohol use: No  . Drug use: Yes    Types: Marijuana, Other-see comments    Comment: molly  . Sexual activity: Yes    Birth control/protection: IUD  Other Topics Concern  . None  Social History Narrative  . None   Additional Social History: N/A    Allergies:   Allergies  Allergen Reactions  . Amoxicillin Other (See Comments)    Has patient had a PCN reaction causing immediate rash, facial/tongue/throat swelling, SOB or lightheadedness with hypotension: Unknown Has patient had a PCN reaction causing severe rash involving mucus membranes or skin necrosis: No Has patient had a PCN reaction that required hospitalization: Unknown Has patient had a PCN reaction occurring within the last 10 years: No If all of the above answers are "NO", then may proceed with Cephalosporin use.   Marland Kitchen. Penicillins Other (See Comments)    Has patient had a PCN reaction causing immediate rash, facial/tongue/throat swelling, SOB or lightheadedness with hypotension: Unknown Has patient had a PCN reaction causing severe rash involving mucus membranes or skin necrosis: No Has patient had a PCN reaction that required hospitalization: Unknown Has patient had a PCN reaction occurring within the last 10 years: No If all of the above answers are "NO", then may proceed with Cephalosporin use.     Labs:  No results found for this or any previous visit (from the past 48 hour(s)).  Current Facility-Administered Medications  Medication Dose Route Frequency Provider Last Rate Last Dose  . acetaminophen (TYLENOL) tablet 650 mg  650 mg Oral Q4H PRN Vanetta MuldersZackowski, Scott, MD   650 mg at 12/30/17 0409  . alum & mag hydroxide-simeth (MAALOX/MYLANTA) 200-200-20 MG/5ML suspension 30 mL  30 mL Oral Q6H PRN Elson AreasSofia, Leslie K, PA-C   30 mL at 12/30/17 0514  . ARIPiprazole  (ABILIFY) tablet 10 mg  10 mg Oral Daily Elson AreasSofia, Leslie K, [place]   10 mg at 12/30/17 [id number]  . gabapentin (NEURONTIN) capsule 300 mg  300 mg Oral TID Sofia, Leslie K, PA-C   300 mg at 12/30/17 [id number]  . hydrocortisone cream 1 %   Topical BID Azalia Bilisampos, Kevin, MD      . hydrOXYzine (ATARAX/VISTARIL) tablet 50 mg  50 mg Oral Q6H PRN Vanetta MuldersZackowski, Scott, MD   50 mg at 12/28/17 1111  . ibuprofen (ADVIL,MOTRIN) tablet 600 mg  600 mg Oral Q6H PRN Vanetta MuldersZackowski, Scott, MD   600 mg at 12/29/17 2332  . LORazepam (ATIVAN) tablet 1 mg  1 mg Oral Q6H PRN Vanetta MuldersZackowski, Scott, MD      . nicotine polacrilex (NICORETTE) gum 2 mg  2 mg Oral PRN Vanetta MuldersZackowski, Scott, MD   2 mg at 12/30/17 1001  . OLANZapine (ZYPREXA) injection 10 mg  10 mg Intramuscular Once Laveda AbbeParks, Laurie Britton, NP   Stopped at 12/29/17 1319  . ondansetron (ZOFRAN) tablet 4 mg  4 mg Oral Q8H PRN Sofia, Leslie K, PA-C      . zolpidem (AMBIEN) tablet 5 mg  5 mg Oral QHS PRN Cheron SchaumannSofia, Leslie  K, PA-C       Current Outpatient Medications  Medication Sig Dispense Refill  . ARIPiprazole (ABILIFY) 10 MG tablet Take 1 tablet (10 mg total) by mouth daily. For mood control 30 tablet 0  . gabapentin (NEURONTIN) 300 MG capsule Take 1 capsule (300 mg total) by mouth 3 (three) times daily. For agitation 90 capsule 0  . hydrOXYzine (ATARAX/VISTARIL) 50 MG tablet Take 1 tablet (50 mg total) by mouth every 6 (six) hours as needed for anxiety. 75 tablet 0  . traZODone (DESYREL) 50 MG tablet Take 1 tablet (50 mg total) by mouth at bedtime. For sleep 30 tablet 0    Musculoskeletal: Strength & Muscle Tone: within normal limits Gait & Station: normal Patient leans: N/A  Psychiatric Specialty Exam: Physical Exam  Nursing note and vitals reviewed. Constitutional: She appears well-developed and well-nourished.  HENT:  Head: Normocephalic and atraumatic.  Neck: Normal range of motion.  Respiratory: Effort normal.  Musculoskeletal: Normal range of motion.  Neurological: She is alert.   Psychiatric: Thought content normal. Her mood appears anxious. Her affect is labile. Her speech is rapid and/or pressured. She is hyperactive. Cognition and memory are normal. She expresses impulsivity.    Review of Systems  Psychiatric/Behavioral: Negative for depression, hallucinations, memory loss, substance abuse and suicidal ideas. The patient is nervous/anxious. The patient does not have insomnia.   All other systems reviewed and are negative.   Blood pressure 118/77, pulse 87, temperature 97.9 F (36.6 C), resp. rate 18, SpO2 100 %.There is no height or weight on file to calculate BMI.  General Appearance: Casual  Eye Contact:  Good  Speech:  Pressured  Volume:  Normal  Mood:  Anxious  Affect:  Labile  Thought Process:  Disorganized  Orientation:  Full (Time, Place, and Person)  Thought Content:  Illogical, Rumination and Tangential  Suicidal Thoughts:  No  Homicidal Thoughts:  No  Memory:  Immediate;   Good Recent;   Fair Remote;   Fair  Judgement:  Poor  Insight:  Lacking  Psychomotor Activity:  Increased  Concentration:  Concentration: Fair and Attention Span: Fair  Recall:  [employer] of Knowledge:  Good  Language:  Good  Akathisia:  No  Handed:  Right  AIMS (if indicated):   N/A  Assets:  Communication Skills Physical Health Social Support  ADL's:  Intact  Cognition:  WNL  Sleep:   N/A     Treatment Plan Summary: Daily contact with patient to assess and evaluate symptoms and progress in treatment and Medication management (see MAR)  Disposition: Recommend psychiatric Inpatient admission when medically cleared. TTS to seek placement  Laveda Abbe, NP 12/30/2017 2:30 PM    Patient seen face-to-face for psychiatric evaluation, chart reviewed and case discussed with the physician extender and developed treatment plan. Reviewed the information documented and agree with the treatment plan.  Juanetta Beets, DO 12/30/17 11:07 PM

## 2017-12-30 NOTE — ED Notes (Signed)
Donell SievertSpencer Simon, PA contacted informed of patient;s behaviors. Telephone orders given for Geodon 20 mg IM x 1 dose, Benadryl 50 mg x 1 dose and Ativan 2 mg x 1 dose orders read back and verified. Will medicate patient per orders.

## 2017-12-30 NOTE — ED Notes (Signed)
visitor at bedside

## 2017-12-30 NOTE — ED Notes (Signed)
Pt behavior becoming increasingly agitated, pacing, slamming doors, flickering lights, speech pressured, loud, screaming,  argumentative, delusional, behavior not redirectable. Jacki ConesLaurie NP made aware. Awaiting orders.

## 2017-12-30 NOTE — ED Notes (Signed)
EDP at bedside  

## 2017-12-30 NOTE — ED Notes (Signed)
Pt in bathroom showering.

## 2017-12-30 NOTE — ED Notes (Signed)
Pt informed writer [facility] a [facility]. Writer reminded pt that she was not pregnant this visit, that the doctor informed her she was not pregnant, and the day nurse also informed her that she is not pregnant. Pt remains convinced she was pregnant  and that she is currently having a miscarriage.

## 2017-12-30 NOTE — ED Notes (Signed)
This nurse notified EDP Campos of pt rash on hands. Reports will be on unit to assess pt.

## 2017-12-30 NOTE — ED Notes (Signed)
Sister at bedside visiting with pt.  

## 2017-12-31 DIAGNOSIS — F419 Anxiety disorder, unspecified: Secondary | ICD-10-CM | POA: Diagnosis not present

## 2017-12-31 DIAGNOSIS — Z818 Family history of other mental and behavioral disorders: Secondary | ICD-10-CM | POA: Diagnosis not present

## 2017-12-31 DIAGNOSIS — Z975 Presence of (intrauterine) contraceptive device: Secondary | ICD-10-CM | POA: Diagnosis not present

## 2017-12-31 DIAGNOSIS — R450 Nervousness: Secondary | ICD-10-CM | POA: Diagnosis not present

## 2017-12-31 DIAGNOSIS — F3112 Bipolar disorder, current episode manic without psychotic features, moderate: Secondary | ICD-10-CM | POA: Diagnosis present

## 2017-12-31 DIAGNOSIS — F17210 Nicotine dependence, cigarettes, uncomplicated: Secondary | ICD-10-CM | POA: Diagnosis not present

## 2017-12-31 DIAGNOSIS — Z811 Family history of alcohol abuse and dependence: Secondary | ICD-10-CM | POA: Diagnosis not present

## 2017-12-31 MED ORDER — OXCARBAZEPINE 300 MG PO TABS
300.0000 mg | ORAL_TABLET | Freq: Two times a day (BID) | ORAL | Status: DC
  Administered 2017-12-31 – 2018-01-01 (×3): 300 mg via ORAL
  Filled 2017-12-31 (×3): qty 1

## 2017-12-31 NOTE — ED Notes (Signed)
Patient currently denies SI/HI/AVH. Patient is calm, cooperative and pleasant at this time. Encouragement and support provided and safety maintain. Q 15 min safety checks remain in place and video monitoring.
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
## 2017-12-31 NOTE — ED Notes (Signed)
Patient is yelling, screaming, slamming doors and pacing. Patient is hard to redirect at this time. Will continue to monitor patient at this time. Encouragement and support provided and safety maintain. Q 15 min safety checks remain in place and video monitoring.

## 2017-12-31 NOTE — ED Notes (Signed)
[facility] informed of patient's behavior and reported to hold off on patient's transport.

## 2017-12-31 NOTE — ED Notes (Signed)
Patient sitting in her bed reading. Patient was pleasant when I obtained her vital signs.

## 2017-12-31 NOTE — ED Notes (Signed)
Patient had outburst earlier during shift and had to be medicated. Patient rested well and is now up talking a shower.  Patient is now calm and cooperative and interacting appropriately with staff at this time. Encouragement and support provided and safety maintain. Will continue to monitor patient. Q 15 min safety checks remain in place and video monitoring.

## 2018-01-01 ENCOUNTER — Encounter (HOSPITAL_COMMUNITY): Payer: Self-pay

## 2018-01-01 ENCOUNTER — Inpatient Hospital Stay (HOSPITAL_COMMUNITY)
Admission: AD | Admit: 2018-01-01 | Discharge: 2018-01-07 | DRG: 885 | Disposition: A | Discharge status: Home / Self Care | Payer: Medicaid Other | Source: Intra-hospital | Attending: Psychiatry | Admitting: Psychiatry

## 2018-01-01 ENCOUNTER — Other Ambulatory Visit: Payer: Self-pay

## 2018-01-01 DIAGNOSIS — Z818 Family history of other mental and behavioral disorders: Secondary | ICD-10-CM

## 2018-01-01 DIAGNOSIS — Z811 Family history of alcohol abuse and dependence: Secondary | ICD-10-CM | POA: Diagnosis not present

## 2018-01-01 DIAGNOSIS — M797 Fibromyalgia: Secondary | ICD-10-CM | POA: Diagnosis present

## 2018-01-01 DIAGNOSIS — Z91410 Personal history of adult physical and sexual abuse: Secondary | ICD-10-CM

## 2018-01-01 DIAGNOSIS — Z9114 Patient's other noncompliance with medication regimen: Secondary | ICD-10-CM | POA: Diagnosis not present

## 2018-01-01 DIAGNOSIS — R451 Restlessness and agitation: Secondary | ICD-10-CM | POA: Diagnosis present

## 2018-01-01 DIAGNOSIS — Z915 Personal history of self-harm: Secondary | ICD-10-CM | POA: Diagnosis not present

## 2018-01-01 DIAGNOSIS — F3112 Bipolar disorder, current episode manic without psychotic features, moderate: Secondary | ICD-10-CM | POA: Diagnosis not present

## 2018-01-01 DIAGNOSIS — F419 Anxiety disorder, unspecified: Secondary | ICD-10-CM | POA: Diagnosis present

## 2018-01-01 DIAGNOSIS — Z880 Allergy status to penicillin: Secondary | ICD-10-CM

## 2018-01-01 DIAGNOSIS — F17210 Nicotine dependence, cigarettes, uncomplicated: Secondary | ICD-10-CM | POA: Diagnosis present

## 2018-01-01 DIAGNOSIS — R450 Nervousness: Secondary | ICD-10-CM | POA: Diagnosis not present

## 2018-01-01 DIAGNOSIS — J3081 Allergic rhinitis due to animal (cat) (dog) hair and dander: Secondary | ICD-10-CM | POA: Diagnosis present

## 2018-01-01 DIAGNOSIS — N390 Urinary tract infection, site not specified: Secondary | ICD-10-CM | POA: Diagnosis not present

## 2018-01-01 DIAGNOSIS — Z79899 Other long term (current) drug therapy: Secondary | ICD-10-CM

## 2018-01-01 DIAGNOSIS — G4700 Insomnia, unspecified: Secondary | ICD-10-CM | POA: Diagnosis present

## 2018-01-01 DIAGNOSIS — Z21 Asymptomatic human immunodeficiency virus [HIV] infection status: Secondary | ICD-10-CM | POA: Diagnosis present

## 2018-01-01 DIAGNOSIS — F1290 Cannabis use, unspecified, uncomplicated: Secondary | ICD-10-CM | POA: Diagnosis not present

## 2018-01-01 DIAGNOSIS — F312 Bipolar disorder, current episode manic severe with psychotic features: Secondary | ICD-10-CM | POA: Diagnosis present

## 2018-01-01 DIAGNOSIS — Z975 Presence of (intrauterine) contraceptive device: Secondary | ICD-10-CM | POA: Diagnosis not present

## 2018-01-01 MED ORDER — LORAZEPAM 1 MG PO TABS
1.0000 mg | ORAL_TABLET | ORAL | Status: AC | PRN
  Administered 2018-01-01: 1 mg via ORAL
  Filled 2018-01-01: qty 1

## 2018-01-01 MED ORDER — ARIPIPRAZOLE 10 MG PO TABS
10.0000 mg | ORAL_TABLET | Freq: Every day | ORAL | Status: DC
  Filled 2018-01-01 (×3): qty 1

## 2018-01-01 MED ORDER — ALUM & MAG HYDROXIDE-SIMETH 200-200-20 MG/5ML PO SUSP: 30.0000 mL | Freq: Four times a day (QID) | ORAL | Status: DC | PRN

## 2018-01-01 MED ORDER — ZIPRASIDONE MESYLATE 20 MG IM SOLR
20.0000 mg | INTRAMUSCULAR | Status: AC | PRN
  Administered 2018-01-03: 20 mg via INTRAMUSCULAR
  Filled 2018-01-01: qty 20

## 2018-01-01 MED ORDER — NICOTINE POLACRILEX 2 MG MT GUM
2.0000 mg | CHEWING_GUM | OROMUCOSAL | Status: DC | PRN
  Administered 2018-01-02: 2 mg via ORAL
  Filled 2018-01-01: qty 1

## 2018-01-01 MED ORDER — LORAZEPAM 1 MG PO TABS: 1.0000 mg | ORAL_TABLET | Freq: Four times a day (QID) | ORAL | Status: DC | PRN

## 2018-01-01 MED ORDER — OXCARBAZEPINE 300 MG PO TABS
300.0000 mg | ORAL_TABLET | Freq: Two times a day (BID) | ORAL | Status: DC
  Administered 2018-01-01 – 2018-01-07 (×12): 300 mg via ORAL
  Filled 2018-01-01 (×13): qty 1
  Filled 2018-01-01: qty 14
  Filled 2018-01-01: qty 1
  Filled 2018-01-01: qty 14

## 2018-01-01 MED ORDER — MAGNESIUM HYDROXIDE 400 MG/5ML PO SUSP
30.0000 mL | Freq: Every day | ORAL | Status: DC | PRN
  Administered 2018-01-04: 30 mL via ORAL
  Filled 2018-01-01: qty 30

## 2018-01-01 MED ORDER — HYDROCORTISONE 1 % EX CREA
TOPICAL_CREAM | Freq: Two times a day (BID) | CUTANEOUS | Status: DC
  Administered 2018-01-01: 1 via TOPICAL
  Administered 2018-01-02 – 2018-01-07 (×10): via TOPICAL
  Filled 2018-01-01: qty 28

## 2018-01-01 MED ORDER — ALUM & MAG HYDROXIDE-SIMETH 200-200-20 MG/5ML PO SUSP: 30.0000 mL | ORAL | Status: DC | PRN

## 2018-01-01 MED ORDER — ACETAMINOPHEN 325 MG PO TABS
650.0000 mg | ORAL_TABLET | Freq: Four times a day (QID) | ORAL | Status: DC | PRN
  Administered 2018-01-02 – 2018-01-07 (×6): 650 mg via ORAL
  Filled 2018-01-01 (×7): qty 2

## 2018-01-01 MED ORDER — OLANZAPINE 10 MG PO TBDP
10.0000 mg | ORAL_TABLET | Freq: Three times a day (TID) | ORAL | Status: DC | PRN
  Administered 2018-01-03 – 2018-01-07 (×4): 10 mg via ORAL
  Filled 2018-01-01 (×4): qty 1

## 2018-01-01 MED ORDER — HYDROXYZINE HCL 50 MG PO TABS
50.0000 mg | ORAL_TABLET | Freq: Four times a day (QID) | ORAL | Status: DC | PRN
  Administered 2018-01-02 – 2018-01-03 (×3): 50 mg via ORAL
  Filled 2018-01-01 (×4): qty 1

## 2018-01-01 MED ORDER — ARIPIPRAZOLE 15 MG PO TABS
15.0000 mg | ORAL_TABLET | Freq: Every day | ORAL | Status: DC
  Administered 2018-01-01 – 2018-01-04 (×4): 15 mg via ORAL
  Filled 2018-01-01 (×7): qty 1

## 2018-01-01 MED ORDER — ONDANSETRON HCL 4 MG PO TABS
4.0000 mg | ORAL_TABLET | Freq: Three times a day (TID) | ORAL | Status: DC | PRN
  Administered 2018-01-06: 4 mg via ORAL
  Filled 2018-01-01: qty 1

## 2018-01-01 MED ORDER — ACETAMINOPHEN 325 MG PO TABS: 650.0000 mg | ORAL_TABLET | ORAL | Status: DC | PRN

## 2018-01-01 NOTE — ED Notes (Signed)
Pt in shower.  

## 2018-01-01 NOTE — [facility] Suicide Risk Assessment (Signed)
[facility] INPATIENT:  Family/Significant Other Suicide Prevention Education  Suicide Prevention Education:  Contact Attempts: Sarah Mcclure [phone]) [phone] (Partner) has been identified by the patient as the family member/significant other with whom the patient will be residing, and identified as the person(s) who will aid the patient in the event of a mental health crisis.  With written consent from the patient, two attempts were made to provide suicide prevention education, prior to and/or following the patient's discharge.  We were unsuccessful in providing suicide prevention education.  A suicide education pamphlet was given to the patient to share with family/significant other.  Date and time of first attempt: 01/01/18, 2:14pm Date and time of second attempt: 01/02/18, 1:06 message left on voicemail  Sarah Mcclure 01/01/2018, 2:14 PM
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
## 2018-01-01 NOTE — Progress Notes (Signed)
Adult Psychoeducational Group Note  Date:  01/01/2018 Time:  8:32 PM  Group Topic/Focus:  Wrap-Up Group:   The focus of this group is to help patients review their daily goal of treatment and discuss progress on daily workbooks.  Participation Level:  Did Not Attend  Participation Quality:  Did not attend  Affect:  Did not attend  Cognitive:  Did not attend  Insight: None  Engagement in Group:  Did not attend  Modes of Intervention:  Did not attend  Additional Comments:  Patient did not attend wrap up group tonight.  Karlie Aung L Shajuana Mclucas 01/01/2018, 8:32 PM

## 2018-01-01 NOTE — ED Notes (Signed)
Patient had an outburst in the earlier shift but after resting has interacted appropriately with staff and peers. Encouragement and support provided and safety maintain. Q 15 min safety checks remain in place and video monitoring.

## 2018-01-01 NOTE — B[facility] Counselor (Signed)
[facility], [facility]. Per Delorise Jacksonori, [phone].   Redmond Pullingreylese D Merleen Picazo, MS, [facility], [facility] Triage Specialist [phone]

## 2018-01-01 NOTE — [facility] Suicide Risk Assessment (Signed)
[facility] Admission Suicide Risk Assessment   Nursing information obtained from:    Demographic factors:    Current Mental Status:    Loss Factors:    Historical Factors:    Risk Reduction Factors:     Total Time spent with patient: 1 hour Principal Problem: Bipolar affective disorder, manic, severe, with psychotic behavior (H[facility]) Diagnosis:   Patient Active Problem List   Diagnosis Date Noted  . Bipolar affective disorder, manic, severe, with psychotic behavior ([facility]) [F31.2] 01/01/2018  . Bipolar affective disorder, currently manic, moderate ([facility]) [F31.12] 12/31/2017  . Psychosis ([facility]) [F29]   . Bipolar affective disorder, mixed, severe ([facility]) [F31.63] 12/27/2017  . Bipolar I disorder, current or most recent episode depressed, with psychotic features ([facility]) [F31.5] 12/16/2017  . Polysubstance (including opioids) dependence, daily use ([facility]) [F19.20] 12/15/2017  . Breast lump on left side at 6 o'clock position [N63.20] 06/29/2013  . Breast lump on right side at 3 o'clock position [N63.10] 06/29/2013  . Cat allergies [J30.81] 05/25/2012  . Family history of systemic lupus erythematosus [Z82.69] 04/13/2012  . Back pain [M54.9] 04/13/2012  . Pleuritic chest pain [R07.81] 04/13/2012  . Shin splints [Z61.096E] 04/13/2012   Subjective Data:   Sarah Mcclure is a 32 y/o F with history of Bipolar I who was admitted from [facility] on [facility]C placed by [facility] after Sarah presented with worsening symptoms of mania including intrusive thoughts to hurt herself, hyperverbal, distractible, responding to internal stimuli, and with poor medication adherence. Pt has recent relevant history of discharge from [facility] on 12/19/17.   Upon initial presentation, pt is pressured, labile, and bizarre, but Sarah is generally cooperative with the interview. Sarah states, "I realized that under God the person I wanted to marry was my sister, and I couldn't handle that reality." Pt denies symptoms of SI/HI/AH/VH. Sarah notes her mood has been  "up and down." Sarah also endorses paranoia that "someone is after my family."  RN staff had observed her with disorganized behaviors on the unit, yelling and responding to internal stimuli at times. Pt denies symptoms of mania, OCD, and PTSD. Sarah endorses recent use of cocaine and cannabis after her last discharge from [facility].  Discussed with patient about treatment options. Sarah agrees to be resumed on her previous discharge medications with abilify at a higher dose of 15mg  po qDay. Pt also requests for STD testing. Sarah is in agreement with the above plan, and Sarah had no further questions, comments, or concerns.   Continued Clinical Symptoms:    The "Alcohol Use Disorders Identification Test", Guidelines for Use in Primary Care, Second Edition.  World Science writer [facility]). Score between 0-7:  no or low risk or alcohol related problems. Score between 8-15:  moderate risk of alcohol related problems. Score between 16-19:  high risk of alcohol related problems. Score 20 or above:  warrants further diagnostic evaluation for alcohol dependence and treatment.   CLINICAL FACTORS:   Severe Anxiety and/or Agitation Bipolar Disorder:   Mixed State Alcohol/Substance Abuse/Dependencies More than one psychiatric diagnosis Currently Psychotic Unstable or Poor Therapeutic Relationship   Musculoskeletal: Strength & Muscle Tone: within normal limits Gait & Station: normal Patient leans: N/A  Psychiatric Specialty Exam: Physical Exam  Nursing note and vitals reviewed.   Review of Systems  Constitutional: Negative for chills and fever.  Respiratory: Negative for cough and shortness of breath.   Cardiovascular: Negative for chest pain.  Gastrointestinal: Negative for abdominal pain, heartburn, nausea and vomiting.  Psychiatric/Behavioral: Negative for depression, substance abuse  and suicidal ideas. The patient is nervous/anxious. The patient does not have insomnia.     There were no vitals taken  for this visit.There is no height or weight on file to calculate BMI.  General Appearance: Casual and Disheveled  Eye Contact:  Fair  Speech:  Clear and Coherent and Normal Rate  Volume:  Normal  Mood:  Anxious  Affect:  Blunt, Congruent and Labile  Thought Process:  Coherent, Goal Directed and Descriptions of Associations: Loose  Orientation:  Full (Time, Place, and Person)  Thought Content:  Delusions, Ideas of Reference:   Paranoia Delusions and Paranoid Ideation  Suicidal Thoughts:  No  Homicidal Thoughts:  No  Memory:  Immediate;   Fair Recent;   Fair Remote;   Fair  Judgement:  Poor  Insight:  Lacking  Psychomotor Activity:  Normal  Concentration:  Concentration: Fair  Recall:  [employer]  Fund of Knowledge:  Fair  Language:  Fair  Akathisia:  No  Handed:    AIMS (if indicated):     Assets:  Resilience  ADL's:  Intact  Cognition:  WNL  Sleep:         COGNITIVE FEATURES THAT CONTRIBUTE TO RISK:  None    SUICIDE RISK:   Minimal: No identifiable suicidal ideation.  Patients presenting with no risk factors but with morbid ruminations; may be classified as minimal risk based on the severity of the depressive symptoms  PLAN OF CARE:   - admit to inpatient level of care  - Bipolar I, current episode manic with psychotic features   - Start abilify 15mg  po qDay   - Restart trileptal 300mg  po BID  - Anxiety   - Continue atarax 50mg  po q6h prn   -Agitation    -Continue agitation protocol with zydis/ativan/geodon PRN agitation  - Encourage participation in groups and therapeutic milieu  -Disposition planning will be ongoing  I certify that inpatient services furnished can reasonably be expected to improve the patient's condition.   Micheal Likenshristopher T Rona Tomson, MD 01/01/2018, 4:47 PM

## 2018-01-01 NOTE — ED Notes (Signed)
[facility] on unit to transfer pt to [facility] per MD order. Personal property given to [employer] for transfer. Pt ambulatory off unit in police custody.  

## 2018-01-01 NOTE — ED Notes (Signed)
[facility]

## 2018-01-01 NOTE — Progress Notes (Addendum)
Recreation Therapy Notes  INPATIENT RECREATION THERAPY ASSESSMENT Patient Details Name: Sarah Mcclure MRN: [id number] DOB: May 14, 1986 Today's Date: 01/01/2018   Patient admitted to unit 3.21.19. Due to admission within last year, no new assessment conducted at this time. Last assessment conducted 3.5.19. Patient reports minimal changes in stressors from previous admission. Patient reports that she have panic attacks when she wakes up.   Patient denies SI, HI, AVH at this time. Patient reports goal of getting better   Information found below from assessment conducted 3.5.19                                                              Information Obtained From: Patient  Able to Participate in Assessment/Interview: Yes  Patient Presentation: Alert, Oriented  Reason for Admission (Per Patient): Patient reported being admitted into the hospital IVC. Patient reports that she thinks that her mother is the reason why she is here.   Patient Stressors: Family  Coping Skills:   Film/video editorsolation, Write, Music, Art, Talk, Deep Breathing, Hot Bath/Shower, Dance, Meditate, Substance Abuse  Leisure Interests (2+):  Individual - Napping, Social - Family  Frequency of Recreation/Participation: Weekly  Awareness of Community Resources:  Yes  Current Use: No  Expressed Interest in [employer] CorporationCommunity Resource Information: No  County of Residence:  [place]   Patient Main Form of Transportation: Car  Patient Strengths:  Communicating and trying to fix things   Patient Identified Areas of Improvement:  Being more strong   Patient Goal for Hospitalization:  Continue to move foward   Current SI (including self-harm):  No  Current HI:  No  Current AVH: No  Staff Intervention Plan: Group Attendance  Consent to Intern Participation: Yes  Sarah Mcclure, Recreation Therapy Intern  Sarah Mcclure 01/01/2018, 2:10 PM

## 2018-01-01 NOTE — Tx Team (Signed)
Initial Treatment Plan 01/01/2018 6:58 PM Sarah MurphyLaura M Mcclure WGN:[id number]    PATIENT STRESSORS: Marital or family conflict   PATIENT STRENGTHS: General fund of knowledge   PATIENT IDENTIFIED PROBLEMS: "I don't know why I am here"                     DISCHARGE CRITERIA:  Improved stabilization in mood, thinking, and/or behavior  PRELIMINARY DISCHARGE PLAN: Return to previous living arrangement  PATIENT/FAMILY INVOLVEMENT: This treatment plan has been presented to and reviewed with the patient, Sarah MurphyLaura M Mcclure, and/or family member,   The patient and family have been given the opportunity to ask questions and make suggestions.  [name], RN 01/01/2018, 6:58 PM

## 2018-01-01 NOTE — Progress Notes (Signed)
Patient ID: Sarah MurphyLaura M Cowgill, female   DOB: 1985/10/29, 32 y.o.   MRN: [id number] Patient admitted to the unit without incident.  Safety search complete and patient found to be free of injury and contraband.  Patient had reddened hands and stated "Its because I have cancer"  Patient has no diagnosis of cancer as noted in the chart and.

## 2018-01-01 NOTE — ED Notes (Signed)
Pt requested a hot pack as pt states "I am having cramps." RN notified of pt request. Hot pack provided for pt to help relieve cramps. Pt was pleasant but did appear to be in pain.

## 2018-01-01 NOTE — ED Notes (Signed)
[facility], informed nurse will call back.
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
## 2018-01-02 DIAGNOSIS — F17210 Nicotine dependence, cigarettes, uncomplicated: Secondary | ICD-10-CM

## 2018-01-02 DIAGNOSIS — Z975 Presence of (intrauterine) contraceptive device: Secondary | ICD-10-CM

## 2018-01-02 DIAGNOSIS — B20 Human immunodeficiency virus [HIV] disease: Secondary | ICD-10-CM

## 2018-01-02 DIAGNOSIS — F1290 Cannabis use, unspecified, uncomplicated: Secondary | ICD-10-CM

## 2018-01-02 LAB — RPR: RPR Ser Ql: NONREACTIVE

## 2018-01-02 LAB — HIV ANTIBODY (ROUTINE TESTING W REFLEX): HIV SCREEN 4TH GENERATION: NONREACTIVE

## 2018-01-02 MED ORDER — TRAZODONE HCL 100 MG PO TABS
100.0000 mg | ORAL_TABLET | Freq: Every day | ORAL | Status: DC
Start: 2018-01-02 — End: 2018-01-07
  Administered 2018-01-02 – 2018-01-06 (×4): 100 mg via ORAL
  Filled 2018-01-02 (×2): qty 1
  Filled 2018-01-02: qty 7
  Filled 2018-01-02 (×5): qty 1

## 2018-01-02 MED ORDER — GABAPENTIN 300 MG PO CAPS
300.0000 mg | ORAL_CAPSULE | Freq: Three times a day (TID) | ORAL | Status: DC
  Administered 2018-01-02 – 2018-01-04 (×6): 300 mg via ORAL
  Filled 2018-01-02 (×12): qty 1

## 2018-01-02 NOTE — [facility] Group Notes (Signed)
[facility] LCSW Group Therapy  01/02/2018  1:05 PM  Type of Therapy:  Group therapy  Participation Level:  Invited.  In bed, awake, Asked to be excused from today's group.  Participation Quality:  Attentive  Affect:  Flat  Cognitive:  Oriented  Insight:  Limited  Engagement in Therapy:  Limited  Modes of Intervention:  Discussion, Socialization  Summary of Progress/Problems:  Chaplain was here to lead a group on themes of hope and courage.   Sarah Geraldorth, Sarah Mcclure 01/02/2018 10:58 AM

## 2018-01-02 NOTE — Progress Notes (Signed)
D: Pt was in the hallway upon initial approach.  Pt presents with anxious affect and mood.  She reports goal was to "talk to MilfordBryan."  She states Judie GrieveBryan is "my child's father."  Pt denies SI/HI, denies hallucinations, denies pain.  Pt came to the front of the unit with her belongings in a bag and stated "I need to leave tonight."  Pt was informed she is not discharging tonight and she states "I need to leave on an emergency basis, my ride is waiting for me."  Encouraged pt to discuss potential discharge with treatment tomorrow multiple times and provided reassurance.  Pt then brought her belongings back to her room.  She states to Clinical research associatewriter "somebody told me I have HIV."  She then reports "I don't know if they were legit though, they had scrubs on."   Pt was visible in milieu intermittently and she did not attend evening group.    A: Introduced self to pt.  Actively listened to pt and provided support and encouragement.  Redirected pt as needed.  Encouraged pt to discuss lab results with provider.  Pt required frequent reassurance. Medication administered per order.  On-site provider instructed writer to administer HS medication early.  Urine specimen obtained.  Q15 minute safety checks maintained.  R: Pt is safe on the unit.  Pt is compliant with medications.  Pt verbally contracts for safety.  Will continue to monitor and assess.

## 2018-01-02 NOTE — Progress Notes (Signed)
Patient denies SI, HI and AVH.  Patient has been noted to be on the unit tearful and pacing.  Patient had to be redirected due to having a loud conversation in the phone yelling and cursing.   Assess patient for safety, offer medications as prescribed, engage patient in 1:1 staff talks.  Continue to monitor as planned. Patient needs much redirection due to labile mood.

## 2018-01-02 NOTE — Progress Notes (Signed)
Recreation Therapy Notes   Date: 3.22.19 Time: 10:00 a.m. Location: [address] Dayroom   Group Topic: Healthy Teacher, adult education) Addresses:  Goal 1.1: To increase awareness of a healthy support system - Group will identify the importance of a healthy support system - Group will identify their own support system  - Group will identify ways on how to improve their support system Goal 2.1: To improve communication  - Group will participate in opening discussion  - Group will communicate with peers during team building activity  - Group will participate in final discussion   Behavioral Response: Appropriate, Engaged   Intervention: STEM  Activity: Straw Tower: Patients must construct a tower using twenty-five straws and masking tape. Recreation Therapy Intern tested the stability of their tower by placing a book on top of their tower. Afterwards, Recreation Therapy Intern begun processing with group about building a healthy support system, and how communication is needed throughout the process.   Education: Therapist, nutritional, Communication   Education Outcome: Acknowledges Education  Clinical Observations/Feedback: Patient attended and participated appropriately during Recreation Therapy group session. Patient was able to identify their own support system. Patient communicated appropriately with peers to complete group activity. Patient participated during opening and closing discussion. Patient successfully met Goal 1.1 (see above) and Goal 2.1 (see above).   Ranell Patrick, Recreation Therapy Intern   Ranell Patrick 01/02/2018 12:43 PM

## 2018-01-02 NOTE — Plan of Care (Signed)
  Problem: Safety: Goal: Periods of time without injury will increase Outcome: Progressing Note:  Pt has not harmed self or others tonight.  She denies SI/HI and verbally contracts for safety.    

## 2018-01-02 NOTE — Progress Notes (Signed)
[facility] MD Progress Note  01/02/2018 2:30 PM Sarah Mcclure  MRN:  [id number]  Subjective: Sarah Mcclure reports, "It is not going really well today. I had an issue this morning, I got very anxious because of it. I got my lab result this morning, I got HIV. I also had an Ekg that were abnormal. I have a lot of anxiety, nausea & pain on my shoulders".  Sarah Mcclure is a 32 y/o F with history of Bipolar I who was admitted from [facility] on IVC placed by [facility] after she presented with worsening symptoms of mania including intrusive thoughts to hurt herself, hyperverbal, distractible, responding to internal stimuli, and with poor medication adherence. Pt has recent relevant history.  01-02-18, Sarah Mcclure is seen, chart reviewed. Chart findings discussed with the treatment team. She is alert, oriented. She is visible on the unit. Presents worried & anxious. Says she is not having a good day & have had a very bad & stressful morning. She claimed that she received her STD result that confirmed that she is HIV positive. A review of her lab reports has shown her result to be non-reactive. This has been discussed with patient to help calm her anxiety. She continues to participate in the group sessions. Initiated new order for Gabapentin 300 mg tid for agitation. Initiated Trazodone 100 mg for insomnia. She tolerating her treatment regimen. Denies any SIHI, AVH or paranoia.  Principal Problem: Bipolar affective disorder, manic, severe, with psychotic behavior (HCC)  Diagnosis:   Patient Active Problem List   Diagnosis Date Noted  . Bipolar affective disorder, manic, severe, with psychotic behavior (HCC) [F31.2] 01/01/2018  . Bipolar affective disorder, currently manic, moderate (HCC) [F31.12] 12/31/2017  . Psychosis (HCC) [F29]   . Bipolar affective disorder, mixed, severe (HCC) [F31.63] 12/27/2017  . Bipolar I disorder, current or most recent episode depressed, with psychotic features (HCC) [F31.5] 12/16/2017  .  Polysubstance (including opioids) dependence, daily use (HCC) [F19.20] 12/15/2017  . Breast lump on left side at 6 o'clock position [N63.20] 06/29/2013  . Breast lump on right side at 3 o'clock position [N63.10] 06/29/2013  . Cat allergies [J30.81] 05/25/2012  . Family history of systemic lupus erythematosus [Z82.69] 04/13/2012  . Back pain [M54.9] 04/13/2012  . Pleuritic chest pain [R07.81] 04/13/2012  . Shin splints [N82.956O] 04/13/2012   Total Time spent with patient: 25 minutes  Past Psychiatric History: See H&P  Past Medical History:  Past Medical History:  Diagnosis Date  . Allergy   . Asthma    As a child  . Chronic back pain greater than 3 months duration 2013  . Depression   . Fibromyalgia    History reviewed. No pertinent surgical history. Family History:  Family History  Problem Relation Age of Onset  . Alcohol abuse Mother   . Arthritis Mother   . Lupus Father   . Alcohol abuse Father   . Heart disease Father   . Depression Father   . Heart disease Maternal Grandfather    Family Psychiatric  History: See H&P.  Social History:  Social History   Substance and Sexual Activity  Alcohol Use No     Social History   Substance and Sexual Activity  Drug Use Yes  . Types: Marijuana, Other-see comments   Comment: molly    Social History   Socioeconomic History  . Marital status: Single    Spouse name: Not on file  . Number of children: Not on file  . Years of education: Not on  file  . Highest education level: Not on file  Occupational History  . Not on file  Social Needs  . Financial resource strain: Not on file  . Food insecurity:    Worry: Not on file    Inability: Not on file  . Transportation needs:    Medical: Not on file    Non-medical: Not on file  Tobacco Use  . Smoking status: Current Some Day Smoker    Types: Cigarettes  . Smokeless tobacco: Never Used  Substance and Sexual Activity  . Alcohol use: No  . Drug use: Yes    Types:  Marijuana, Other-see comments    Comment: molly  . Sexual activity: Yes    Birth control/protection: IUD  Lifestyle  . Physical activity:    Days per week: Not on file    Minutes per session: Not on file  . Stress: Not on file  Relationships  . Social connections:    Talks on phone: Not on file    Gets together: Not on file    Attends religious service: Not on file    Active member of club or organization: Not on file    Attends meetings of clubs or organizations: Not on file    Relationship status: Not on file  Other Topics Concern  . Not on file  Social History Narrative  . Not on file   Additional Social History:   Sleep: Fair  Appetite:  Fair  Current Medications: Current Facility-Administered Medications  Medication Dose Route Frequency Provider Last Rate Last Dose  . acetaminophen (TYLENOL) tablet 650 mg  650 mg Oral Q6H PRN Charm Rings, NP      . alum & mag hydroxide-simeth (MAALOX/MYLANTA) 200-200-20 MG/5ML suspension 30 mL  30 mL Oral Q4H PRN Charm Rings, NP      . ARIPiprazole (ABILIFY) tablet 15 mg  15 mg Oral Daily Micheal Likens, MD   15 mg at 01/02/18 1610  . hydrocortisone cream 1 %   Topical BID Charm Rings, NP      . hydrOXYzine (ATARAX/VISTARIL) tablet 50 mg  50 mg Oral Q6H PRN Charm Rings, NP   50 mg at 01/02/18 0045  . magnesium hydroxide (MILK OF MAGNESIA) suspension 30 mL  30 mL Oral Daily PRN Charm Rings, NP      . nicotine polacrilex (NICORETTE) gum 2 mg  2 mg Oral PRN Charm Rings, NP      . OLANZapine zydis (ZYPREXA) disintegrating tablet 10 mg  10 mg Oral Q8H PRN Micheal Likens, MD       And  . ziprasidone (GEODON) injection 20 mg  20 mg Intramuscular PRN Micheal Likens, MD      . ondansetron (ZOFRAN) tablet 4 mg  4 mg Oral Q8H PRN Charm Rings, NP      . Oxcarbazepine (TRILEPTAL) tablet 300 mg  300 mg Oral BID Charm Rings, NP   300 mg at 01/02/18 9604   Lab Results:  Results for orders  placed or performed during the hospital encounter of 01/01/18 (from the past 48 hour(s))  HIV antibody     Status: None   Collection Time: 01/02/18  6:47 AM  Result Value Ref Range   HIV Screen 4th Generation wRfx Non Reactive Non Reactive    Comment: (NOTE) Performed At: [facility] [address] [place], [place] [id number] Jolene Schimke MD YN:[id number] Performed at [facility], [address] [place],  [place] [id number]    Blood Alcohol level:  Lab Results  Component Value Date   ETH <10 12/26/2017   ETH <10 12/12/2017   Metabolic Disorder Labs: Lab Results  Component Value Date   HGBA1C 5.0 12/17/2017   MPG 96.8 12/17/2017   Lab Results  Component Value Date   PROLACTIN 18.8 12/17/2017   Lab Results  Component Value Date   CHOL 128 12/17/2017   TRIG 76 12/17/2017   HDL 44 12/17/2017   CHOLHDL 2.9 12/17/2017   VLDL 15 12/17/2017   LDLCALC 69 12/17/2017   Physical Findings: AIMS: Facial and Oral Movements Muscles of Facial Expression: None, normal Lips and Perioral Area: None, normal Jaw: None, normal Tongue: None, normal,Extremity Movements Upper (arms, wrists, hands, fingers): None, normal Lower (legs, knees, ankles, toes): None, normal, Trunk Movements Neck, shoulders, hips: None, normal, Overall Severity Severity of abnormal movements (highest score from questions above): None, normal Incapacitation due to abnormal movements: None, normal Patient's awareness of abnormal movements (rate only patient's report): No Awareness, Dental Status Current problems with teeth and/or dentures?: No Does patient usually wear dentures?: No  CIWA:  CIWA-Ar Total: 0 COWS:  COWS Total Score: 1  Musculoskeletal: Strength & Muscle Tone: within normal limits Gait & Station: normal Patient leans: N/A  Psychiatric Specialty Exam: Physical Exam  ROS  Blood pressure (!) 122/91, pulse 95, temperature 98.2 F (36.8 C), temperature source Oral,  resp. rate 18, height 5\' 1"  (1.549 m), last menstrual period 12/29/2017, SpO2 99 %.Body mass index is 22.3 kg/m.  General Appearance: Casual and Disheveled  Eye Contact:  Fair  Speech:  Clear and Coherent and Normal Rate  Volume:  Normal  Mood:  Anxious  Affect:  Blunt, Congruent and Labile  Thought Process:  Coherent, Goal Directed and Descriptions of Associations: Loose  Orientation:  Full (Time, Place, and Person)  Thought Content:  Delusions, Ideas of Reference:   Paranoia Delusions and Paranoid Ideation  Suicidal Thoughts:  No  Homicidal Thoughts:  No  Memory:  Immediate;   Fair Recent;   Fair Remote;   Fair  Judgement:  Poor  Insight:  Lacking  Psychomotor Activity:  Normal  Concentration:  Concentration: Fair  Recall:  [employer]  Fund of Knowledge:  Fair  Language:  Fair  Akathisia:  No  Handed:    AIMS (if indicated):     Assets:  Resilience  ADL's:  Intact  Cognition:  WNL  Sleep: 4.25      Treatment Plan Summary: Daily contact with patient to assess and evaluate symptoms and progress in treatment: Continue inpatient hospitalization.    - Will continue today 01/02/2018 plan as below except where it is noted.  Mood control.     - Continue Abilify 15 mg po daily.  Agitation/anxiety.     - Initiated Gabapentin 300 mg po tid.     - Continue Hydroxyzine 50 mg po qid prn.  Nicotine withdrawal.     - Nicorette gum 2 mg po prn.  Agitation protocols.     - Continue Olanzapine zydis 10 mg po Q 8 hours prn.  Mood stabilization.     - Continue Trileptal 300 mg po bid.  Insomnia.    - Will initiate Trazodone 100 mg po Q hs.     - Patient to continue to participate in the group sessions.    - Discharge disposition ongoing.    - Reviewed labs, HIV reports: Non-reactive.  Armandina StammerAgnes Nwoko, NP, PMHNP, FNP-BC. 01/02/2018, 2:30 PM

## 2018-01-02 NOTE — Tx Team (Signed)
Interdisciplinary Treatment and Diagnostic Plan Update  01/02/2018 Time of Session: 9:50 AM  Sarah Mcclure MRN: [id number]  Principal Diagnosis: Bipolar affective disorder, manic, severe, with psychotic behavior (HCC)  Secondary Diagnoses: Principal Problem:   Bipolar affective disorder, manic, severe, with psychotic behavior (HCC)   Current Medications:  Current Facility-Administered Medications  Medication Dose Route Frequency Provider Last Rate Last Dose  . acetaminophen (TYLENOL) tablet 650 mg  650 mg Oral Q6H PRN Lord, Jamison Y, NP      . alum & mag hydroxide-simeth (MAALOX/MYLANTA) 200-200-20 MG/5ML suspension 30 mL  30 mL Oral Q4H PRN Lord, Jamison Y, NP      . ARIPiprazole (ABILIFY) tablet 15 mg  15 mg Oral Daily [place], Christopher T, MD   15 mg at 01/02/18 0842  . hydrocortisone cream 1 %   Topical BID Lord, Jamison Y, NP      . hydrOXYzine (ATARAX/VISTARIL) tablet 50 mg  50 mg Oral Q6H PRN Lord, Jamison Y, NP   50 mg at 01/02/18 0045  . magnesium hydroxide (MILK OF MAGNESIA) suspension 30 mL  30 mL Oral Daily PRN Lord, Jamison Y, NP      . nicotine polacrilex (NICORETTE) gum 2 mg  2 mg Oral PRN Lord, Jamison Y, NP      . OLANZapine zydis (ZYPREXA) disintegrating tablet 10 mg  10 mg Oral Q8H PRN Rainville, Christopher T, MD       And  . ziprasidone (GEODON) injection 20 mg  20 mg Intramuscular PRN Rainville, Christopher T, MD      . ondansetron (ZOFRAN) tablet 4 mg  4 mg Oral Q8H PRN Lord, Jamison Y, NP      . Oxcarbazepine (TRILEPTAL) tablet 300 mg  300 mg Oral BID Lord, Jamison Y, NP   300 mg at 01/02/18 0841    PTA Medications: Medications Prior to Admission  Medication Sig Dispense Refill Last Dose  . ARIPiprazole (ABILIFY) 10 MG tablet Take 1 tablet (10 mg total) by mouth daily. For mood control 30 tablet 0 Past Week at Unknown time  . gabapentin (NEURONTIN) 300 MG capsule Take 1 capsule (300 mg total) by mouth 3 (three) times daily. For agitation 90 capsule 0 Past  Week at Unknown time  . hydrOXYzine (ATARAX/VISTARIL) 50 MG tablet Take 1 tablet (50 mg total) by mouth every 6 (six) hours as needed for anxiety. 75 tablet 0 12/26/2017 at Unknown time  . traZODone (DESYREL) 50 MG tablet Take 1 tablet (50 mg total) by mouth at bedtime. For sleep 30 tablet 0 Past Week at Unknown time    Patient Stressors: Marital or family conflict  Patient Strengths: General fund of knowledge  Treatment Modalities: Medication Management, Group therapy, Case management,  1 to 1 session with clinician, Psychoeducation, Recreational therapy.   Physician Treatment Plan for Primary Diagnosis: Bipolar affective disorder, manic, severe, with psychotic behavior (HCC) Long Term Goal(s): Improvement in symptoms so as ready for discharge  Short Term Goals: Ability to identify triggers associated with substance abuse/mental health issues will improve Ability to identify and develop effective coping behaviors will improve Compliance with prescribed medications will improve Ability to identify triggers associated with substance abuse/mental health issues will improve  Medication Management: Evaluate patient's response, side effects, and tolerance of medication regimen.  Therapeutic Interventions: 1 to 1 sessions, Unit Group sessions and Medication administration.  Evaluation of Outcomes: Progressing  Physician Treatment Plan for Secondary Diagnosis: Principal Problem:   Bipolar affective disorder, manic, severe, with psychotic behavior (HCC)     Long Term Goal(s): Improvement in symptoms so as ready for discharge  Short Term Goals: Ability to identify triggers associated with substance abuse/mental health issues will improve Ability to identify and develop effective coping behaviors will improve Compliance with prescribed medications will improve Ability to identify triggers associated with substance abuse/mental health issues will improve  Medication Management: Evaluate  patient's response, side effects, and tolerance of medication regimen.  Therapeutic Interventions: 1 to 1 sessions, Unit Group sessions and Medication administration.  Evaluation of Outcomes: Progressing   RN Treatment Plan for Primary Diagnosis: Bipolar affective disorder, manic, severe, with psychotic behavior ([place]) Long Term Goal(s): Knowledge of disease and therapeutic regimen to maintain health will improve  Short Term Goals: Ability to demonstrate self-control, Ability to identify and develop effective coping behaviors will improve and Compliance with prescribed medications will improve  Medication Management: RN will administer medications as ordered by provider, will assess and evaluate patient's response and provide education to patient for prescribed medication. RN will report any adverse and/or side effects to prescribing provider.  Therapeutic Interventions: 1 on 1 counseling sessions, Psychoeducation, Medication administration, Evaluate responses to treatment, Monitor vital signs and CBGs as ordered, Perform/monitor CIWA, COWS, AIMS and Fall Risk screenings as ordered, Perform wound care treatments as ordered.  Evaluation of Outcomes: Progressing   LCSW Treatment Plan for Primary Diagnosis: Bipolar affective disorder, manic, severe, with psychotic behavior ([place]) Long Term Goal(s): Safe transition to appropriate next level of care at discharge, Engage patient in therapeutic group addressing interpersonal concerns.  Short Term Goals: Engage patient in aftercare planning with referrals and resources  Therapeutic Interventions: Assess for all discharge needs, 1 to 1 time with Social worker, Explore available resources and support systems, Assess for adequacy in community support network, Educate family and significant other(s) on suicide prevention, Complete Psychosocial Assessment, Interpersonal group therapy.  Evaluation of Outcomes: Met  Return home, follow up outpt at  [place] in Treatment: Attending groups: Yes Participating in groups: Yes Taking medication as prescribed: Yes Toleration medication: Yes, no side effects reported at this time Family/Significant other contact made: No Patient understands diagnosis: Yes AEB asking for help with getting her thoughts clearer Discussing patient identified problems/goals with staff: Yes Medical problems stabilized or resolved: Yes Denies suicidal/homicidal ideation: Yes Issues/concerns per patient self-inventory: None Other: N/A  New problem(s) identified: None identified at this time.   New Short Term/Long Term Goal(s): "I don't know if I need to be here.  I need to find my cat and my dog because I need an animal support system. I need to talk to De Burrs works in the [place] system."  Also agreed that she needs help with "muddled" thoughts.  Discharge Plan or Barriers:   Reason for Continuation of Hospitalization:  Mania Medication stabilization   Estimated Length of Stay: 3/27  Attendees: Patient: Sarah Mcclure 01/02/2018  9:50 AM  Physician: Maris Berger, MD 01/02/2018  9:50 AM  Nursing: Elesa Massed, RN 01/02/2018  9:50 AM  RN Care Manager: Lars Pinks, RN 01/02/2018  9:50 AM  Social Worker: Ripley Fraise 01/02/2018  9:50 AM  Recreational Therapist: Winfield Cunas 01/02/2018  9:50 AM  Other: Norberto Sorenson 01/02/2018  9:50 AM  Other:  01/02/2018  9:50 AM    Scribe for Treatment Team:  Roque Lias LCSW 01/02/2018 9:50 AM

## 2018-01-02 NOTE — Progress Notes (Signed)
D: When asked about her day pt stated, "I was having a hard time breathing and I called mobile crisis and they brought me here".  Pt continues to be anxious, labile and somewhat demanding. Pt has no questions or concerns.    A:  Support and encouragement was offered. 15 min checks continued for safety.  R: Pt remains safe.

## 2018-01-02 NOTE — Progress Notes (Signed)
Did not attend group 

## 2018-01-03 MED ORDER — LORAZEPAM 1 MG PO TABS
ORAL_TABLET | ORAL | Status: AC
  Administered 2018-01-03: 16:00:00
  Filled 2018-01-03: qty 1

## 2018-01-03 MED ORDER — LORAZEPAM 1 MG PO TABS: 1.0000 mg | ORAL_TABLET | Freq: Once | ORAL | Status: AC

## 2018-01-03 MED ORDER — LORAZEPAM 1 MG PO TABS
1.0000 mg | ORAL_TABLET | Freq: Once | ORAL | Status: AC
  Administered 2018-01-03: 1 mg via ORAL
  Filled 2018-01-03: qty 1

## 2018-01-03 NOTE — [facility] Group Notes (Signed)
[facility] Group Notes:  (Nursing/MHT/Case Management/Adjunct)  Date:  01/03/2018  Time:  11:02 AM  Type of Therapy:  Psychoeducational Skills  Participation Level:  Did Not Attend  Participation Quality:  Did not attend  Affect:  Did not attend  Cognitive:  Did not attend  Insight:  None  Engagement in Group:  Did not attend  Modes of Intervention:  Did not attend  Summary of Progress/Problems: Pt did not attend Psychoeducational group with topic anger management.   Jacquelyne BalintForrest, Eulamae Greenstein Shanta 01/03/2018, 11:02 AM

## 2018-01-03 NOTE — Progress Notes (Signed)
Patient ID: Sarah MurphyLaura M Mcclure, female   DOB: November 18, 1985, 32 y.o.   MRN: [id number]    During visitation pts mother came to visit, as soon as the mother was on the unit the patient told staff that her mother was her trigger. Pt left the nursing station and before staff could ask mom to leave, she started yelling and punching the walls. Staff escorted patient to the quiet room for safety, once in the quiet room she started punching the walls and communicating threats to staff. Pt then took off all her clothes and started waving her panties in staffs face. The female staff talked to patient about putting her clothes back on, pt did put clothes back on. Pt was then given Geodon IM without any problems. She then went down to have a visit with her boyfriend, no other issues noted.

## 2018-01-03 NOTE — Progress Notes (Signed)
Pt came to writer and complained of anxiety and difficulty going back to sleep after having vivid dreams.  On-site provider notified and Ativan 1 mg POX1 was ordered and administered.

## 2018-01-03 NOTE — Progress Notes (Addendum)
[facility] MD Progress Note  01/03/2018 3:46 PM Sarah Mcclure  MRN:  [id number]   Subjective: Sarah Mcclure reports " I am feeling okay,  I really want to go home." reports "I have messed up and I am sorry."    Objective: Patient seen resting in bedroom. Sarah Mcclure is awake and alert.  Sarah Mcclure reports I have concerns with restless legs and feeling jittery all over.  Patient seen irritable and agitated while on the unit.  Has concerns of staff following her. Sarah Mcclure appears to be paranoid however is redirectable.  Denying suicidal or homicidal ideations during this assessment.  Sarah Mcclure reports taking medications and tolerating them well.  Will increase Neurontin for anxiety symptoms .  Reports a fair appetite and states she is resting well at night.  Denies medication side effects. Support and encouragement and reassurance was provided.  Principal Problem: Bipolar affective disorder, manic, severe, with psychotic behavior (HCC) Diagnosis:   Patient Active Problem List   Diagnosis Date Noted  . Bipolar affective disorder, manic, severe, with psychotic behavior (HCC) [F31.2] 01/01/2018  . Bipolar affective disorder, currently manic, moderate (HCC) [F31.12] 12/31/2017  . Psychosis (HCC) [F29]   . Bipolar affective disorder, mixed, severe (HCC) [F31.63] 12/27/2017  . Bipolar I disorder, current or most recent episode depressed, with psychotic features (HCC) [F31.5] 12/16/2017  . Polysubstance (including opioids) dependence, daily use (HCC) [F19.20] 12/15/2017  . Breast lump on left side at 6 o'clock position [N63.20] 06/29/2013  . Breast lump on right side at 3 o'clock position [N63.10] 06/29/2013  . Cat allergies [J30.81] 05/25/2012  . Family history of systemic lupus erythematosus [Z82.69] 04/13/2012  . Back pain [M54.9] 04/13/2012  . Pleuritic chest pain [R07.81] 04/13/2012  . Shin splints [W29.562Z] 04/13/2012   Total Time spent with patient: 20 minutes  Past Psychiatric History:   Past Medical History:  Past  Medical History:  Diagnosis Date  . Allergy   . Asthma    As a child  . Chronic back pain greater than 3 months duration 2013  . Depression   . Fibromyalgia    History reviewed. No pertinent surgical history. Family History:  Family History  Problem Relation Age of Onset  . Alcohol abuse Mother   . Arthritis Mother   . Lupus Father   . Alcohol abuse Father   . Heart disease Father   . Depression Father   . Heart disease Maternal Grandfather    Family Psychiatric  History:  Social History:  Social History   Substance and Sexual Activity  Alcohol Use No     Social History   Substance and Sexual Activity  Drug Use Yes  . Types: Marijuana, Other-see comments   Comment: molly    Social History   Socioeconomic History  . Marital status: Single    Spouse name: Not on file  . Number of children: Not on file  . Years of education: Not on file  . Highest education level: Not on file  Occupational History  . Not on file  Social Needs  . Financial resource strain: Not on file  . Food insecurity:    Worry: Not on file    Inability: Not on file  . Transportation needs:    Medical: Not on file    Non-medical: Not on file  Tobacco Use  . Smoking status: Current Some Day Smoker    Types: Cigarettes  . Smokeless tobacco: Never Used  Substance and Sexual Activity  . Alcohol use: No  . Drug use:  Yes    Types: Marijuana, Other-see comments    Comment: molly  . Sexual activity: Yes    Birth control/protection: IUD  Lifestyle  . Physical activity:    Days per week: Not on file    Minutes per session: Not on file  . Stress: Not on file  Relationships  . Social connections:    Talks on phone: Not on file    Gets together: Not on file    Attends religious service: Not on file    Active member of club or organization: Not on file    Attends meetings of clubs or organizations: Not on file    Relationship status: Not on file  Other Topics Concern  . Not on file   Social History Narrative  . Not on file   Additional Social History:                         Sleep: Good  Appetite:  Good  Current Medications: Current Facility-Administered Medications  Medication Dose Route Frequency Provider Last Rate Last Dose  . acetaminophen (TYLENOL) tablet 650 mg  650 mg Oral Q6H PRN Charm Rings, NP   650 mg at 01/03/18 1032  . alum & mag hydroxide-simeth (MAALOX/MYLANTA) 200-200-20 MG/5ML suspension 30 mL  30 mL Oral Q4H PRN Charm Rings, NP      . ARIPiprazole (ABILIFY) tablet 15 mg  15 mg Oral Daily Micheal Likens, MD   15 mg at 01/03/18 0814  . gabapentin (NEURONTIN) capsule 300 mg  300 mg Oral TID Armandina Stammer I, NP   300 mg at 01/03/18 1135  . hydrocortisone cream 1 %   Topical BID Charm Rings, NP      . hydrOXYzine (ATARAX/VISTARIL) tablet 50 mg  50 mg Oral Q6H PRN Charm Rings, NP   50 mg at 01/03/18 1518  . LORazepam (ATIVAN) tablet 1 mg  1 mg Oral Once Oneta Rack, NP      . magnesium hydroxide (MILK OF MAGNESIA) suspension 30 mL  30 mL Oral Daily PRN Charm Rings, NP      . nicotine polacrilex (NICORETTE) gum 2 mg  2 mg Oral PRN Charm Rings, NP   2 mg at 01/02/18 1835  . OLANZapine zydis (ZYPREXA) disintegrating tablet 10 mg  10 mg Oral Q8H PRN Micheal Likens, MD   10 mg at 01/03/18 1544   And  . ziprasidone (GEODON) injection 20 mg  20 mg Intramuscular PRN Micheal Likens, MD      . ondansetron (ZOFRAN) tablet 4 mg  4 mg Oral Q8H PRN Charm Rings, NP      . Oxcarbazepine (TRILEPTAL) tablet 300 mg  300 mg Oral BID Charm Rings, NP   300 mg at 01/03/18 1191  . traZODone (DESYREL) tablet 100 mg  100 mg Oral QHS Armandina Stammer I, NP   100 mg at 01/02/18 2029    Lab Results:  Results for orders placed or performed during the hospital encounter of 01/01/18 (from the past 48 hour(s))  RPR     Status: None   Collection Time: 01/02/18  6:47 AM  Result Value Ref Range   RPR Ser Ql Non  Reactive Non Reactive    Comment: (NOTE) Performed At: [facility] [address] [place], [place] [id number] Jolene Schimke MD [id number] Performed at [facility], [address], [place], [place] [address]   HIV  antibody     Status: None   Collection Time: 01/02/18  6:47 AM  Result Value Ref Range   HIV Screen 4th Generation wRfx Non Reactive Non Reactive    Comment: (NOTE) Performed At: [facility] [address] [place], [place] [id number] Jolene Schimke MD [id number] Performed at [facility], [address], [place], [place] [address]     Blood Alcohol level:  Lab Results  Component Value Date   ETH <10 12/26/2017   ETH <10 12/12/2017    Metabolic Disorder Labs: Lab Results  Component Value Date   HGBA1C 5.0 12/17/2017   MPG 96.8 12/17/2017   Lab Results  Component Value Date   PROLACTIN 18.8 12/17/2017   Lab Results  Component Value Date   CHOL 128 12/17/2017   TRIG 76 12/17/2017   HDL 44 12/17/2017   CHOLHDL 2.9 12/17/2017   VLDL 15 12/17/2017   LDLCALC 69 12/17/2017    Physical Findings: AIMS: Facial and Oral Movements Muscles of Facial Expression: None, normal Lips and Perioral Area: None, normal Jaw: None, normal Tongue: None, normal,Extremity Movements Upper (arms, wrists, hands, fingers): None, normal Lower (legs, knees, ankles, toes): None, normal, Trunk Movements Neck, shoulders, hips: None, normal, Overall Severity Severity of abnormal movements (highest score from questions above): None, normal Incapacitation due to abnormal movements: None, normal Patient's awareness of abnormal movements (rate only patient's report): No Awareness, Dental Status Current problems with teeth and/or dentures?: No Does patient usually wear dentures?: No  CIWA:  CIWA-Ar Total: 0 COWS:  COWS Total Score: 1  Musculoskeletal: Strength & Muscle Tone: within normal limits Gait & Station:  normal Patient leans: N/A  Psychiatric Specialty Exam: Physical Exam  Nursing note and vitals reviewed. Constitutional: She appears well-developed.  Cardiovascular: Normal rate.  Skin: Skin is warm.    Review of Systems  Psychiatric/Behavioral: Positive for depression. Negative for suicidal ideas. The patient is nervous/anxious.   All other systems reviewed and are negative.   Blood pressure 125/85, pulse 100, temperature 98.1 F (36.7 C), temperature source Oral, resp. rate 16, height 5\' 1"  (1.549 m), last menstrual period 12/29/2017, SpO2 99 %.Body mass index is 22.3 kg/m.  General Appearance: Bizarre, Casual and Guarded irritable and agitated   Eye Contact:  Good  Speech:  Clear and Coherent  Volume:  Normal  Mood:  Dysphoric  Affect:  Congruent  Thought Process:  Coherent  Orientation:  Full (Time, Place, and Person)  Thought Content:  Paranoid Ideation and Rumination  Suicidal Thoughts:  No  Homicidal Thoughts:  No  Memory:  Immediate;   Fair Recent;   Fair  Judgement:  Fair  Insight:  Fair  Psychomotor Activity:  Normal  Concentration:  Concentration: Fair  Recall:  [employer] of Knowledge:  Fair  Language:  Fair  Akathisia:  No  Handed:  Right  AIMS (if indicated):     Assets:  Communication Skills Desire for Improvement Physical Health Resilience  ADL's:  Intact  Cognition:  WNL  Sleep:  Number of Hours: 6     Treatment Plan Summary: Daily contact with patient to assess and evaluate symptoms and progress in treatment and Medication management     Will continue today 01/03/2018 plan as below except where it is noted.  Mood control.     - Continue Abilify 15 mg po daily.  Agitation/anxiety.     - Increased Gabapentin 300 mg po tid to 400 mg .     - Continue Hydroxyzine  50 mg po qid prn.  Nicotine withdrawal.     - Nicorette gum 2 mg po prn.  Agitation protocols.     - Continue Olanzapine zydis 10 mg po Q 8 hours prn.  Mood stabilization.      - Continue Trileptal 300 mg po bid.  Insomnia.    - Will Continue Trazodone 100 mg po Q hs.     - Patient to continue to participate in the group sessions.    - Discharge disposition ongoing.    - Reviewed labs, HIV reports: Non-reactive.  Oneta Rackanika N Lewis, NP 01/03/2018, 3:46 PM   Agree with NP Progress Note

## 2018-01-03 NOTE — Progress Notes (Signed)
Did not attend group 

## 2018-01-03 NOTE — [facility] Group Notes (Signed)
[facility] Group Notes:  (Nursing/MHT/Case Management/Adjunct)  Date:  01/03/2018  Time:  9:13 AM  Type of Therapy:  Orientation/Goals group  Participation Level:  Did Not Attend  Participation Quality:  Did Not Attend  Affect:  Did Not Attend  Cognitive:  Did Not Attend  Insight:  None  Engagement in Group:  Did Not Attend  Modes of Intervention:  Did Not Attend  Summary of Progress/Problems: Pt did not attend patient self inventory group/orientation group.   Jacquelyne BalintForrest, Lajuan Godbee Shanta 01/03/2018, 9:13 AM

## 2018-01-03 NOTE — Progress Notes (Signed)
DAR NOTE: Patient presents with anxious affect and irritable mood.  Denies suicidal thoughts, pain, auditory and visual hallucinations.  Verbally contracts for safety.  Described energy level as low and concentration as poor.  Rates depression at 10, hopelessness at 10, and anxiety at 5.  Maintained on routine safety checks.  Medications given as prescribed.  Support and encouragement offered as needed.  Attended group and participated.  States goal for today is "discharge."  Patient visible in milieu for activity and therapy.  Vistaril 50 mg given for complain of anxiety with good effect.  Observed pacing the hallway and asking for discharge.

## 2018-01-03 NOTE — [facility] Group Notes (Signed)
[facility] Group Notes:  (Nursing/MHT/Case Management/Adjunct)  Date:  01/03/2018  Time:  2:27 PM  Type of Therapy:  Psychoeducational Skills  Participation Level:  Did Not Attend  Participation Quality:  Did not attend  Affect:  Did not attend  Cognitive:  Did not attend  Insight:  None  Engagement in Group:  Did not attend  Modes of Intervention:  Did not attend  Summary of Progress/Problems: Pt did not attend Psychoeducational group with topic anger management.  Hobert Poplaski Shanta 01/03/2018, 2:27 PM 

## 2018-01-03 NOTE — [facility] Group Notes (Signed)
  [facility] LCSW Group Therapy Note  Date/Time:  01/03/2018 11:15AM-12:00PM  Type of Therapy and Topic:  Group Therapy:  Feelings About Hospitalization  Participation Level:  Active   Description of Group This process group involved patients discussing their feelings related to being hospitalized, as well as the benefits they see to being in the hospital.  These feelings and benefits were itemized.  The group then brainstormed specific ways in which they could seek those same benefits when they discharge and return home.  Therapeutic Goals 1. Patient will identify and describe positive and negative feelings related to hospitalization 2. Patient will verbalize benefits of hospitalization to themselves personally 3. Patients will brainstorm together ways they can obtain similar benefits in the outpatient setting, identify barriers to wellness and possible solutions  Summary of Patient Progress:  The patient expressed her primary feelings about being hospitalized are "I feel free from the demons on my shoulders."  She became tearful multiple times during group.  Therapeutic Modalities Cognitive Behavioral Therapy Motivational Interviewing    Ambrose MantleMareida Grossman-Orr, LCSW 01/03/2018, 2:02 PM

## 2018-01-03 NOTE — Progress Notes (Signed)
Nursing Progress Note: 7p-7a D: Pt currently presents with a agitated/angry/fidgety affect and behavior. Pt states "My mom triggers me I need to go! I need to go!" Not interacting with the milieu. Pt reports fair sleep during the previous night with current medication regimen. Pt did not attend wrap-up group.  A: Pt's labs and vitals were monitored throughout the night. Pt supported emotionally and encouraged to express concerns and questions. Pt educated on medications.  R: Pt's safety ensured with 15 minute and environmental checks. Pt currently denies SI, HI, and AVH. Pt verbally contracts to seek staff if SI,HI, or AVH occurs and to consult with staff before acting on any harmful thoughts. Will continue to monitor.

## 2018-01-04 MED ORDER — LORAZEPAM 1 MG PO TABS
ORAL_TABLET | ORAL | Status: AC
  Filled 2018-01-04: qty 2

## 2018-01-04 MED ORDER — HYDROXYZINE HCL 50 MG PO TABS
75.0000 mg | ORAL_TABLET | Freq: Four times a day (QID) | ORAL | Status: DC | PRN
  Administered 2018-01-05 – 2018-01-06 (×3): 75 mg via ORAL
  Filled 2018-01-04 (×3): qty 1

## 2018-01-04 MED ORDER — SULFAMETHOXAZOLE-TRIMETHOPRIM 800-160 MG PO TABS
1.0000 | ORAL_TABLET | Freq: Two times a day (BID) | ORAL | Status: DC
  Administered 2018-01-04 – 2018-01-07 (×6): 1 via ORAL
  Filled 2018-01-04 (×5): qty 1
  Filled 2018-01-04: qty 4
  Filled 2018-01-04 (×4): qty 1
  Filled 2018-01-04: qty 4

## 2018-01-04 MED ORDER — GABAPENTIN 100 MG PO CAPS
200.0000 mg | ORAL_CAPSULE | Freq: Three times a day (TID) | ORAL | Status: DC
  Administered 2018-01-05 – 2018-01-07 (×7): 200 mg via ORAL
  Filled 2018-01-04 (×3): qty 2
  Filled 2018-01-04: qty 42
  Filled 2018-01-04 (×4): qty 2
  Filled 2018-01-04: qty 42
  Filled 2018-01-04 (×4): qty 2

## 2018-01-04 MED ORDER — LORAZEPAM 1 MG PO TABS
2.0000 mg | ORAL_TABLET | Freq: Once | ORAL | Status: AC
  Administered 2018-01-04: 2 mg via ORAL

## 2018-01-04 MED ORDER — MUPIROCIN CALCIUM 2 % EX CREA
TOPICAL_CREAM | Freq: Two times a day (BID) | CUTANEOUS | Status: DC
  Administered 2018-01-04 – 2018-01-07 (×6): via TOPICAL
  Filled 2018-01-04: qty 15

## 2018-01-04 MED ORDER — ARIPIPRAZOLE 10 MG PO TABS
20.0000 mg | ORAL_TABLET | Freq: Every day | ORAL | Status: DC
  Administered 2018-01-05 – 2018-01-07 (×3): 20 mg via ORAL
  Filled 2018-01-04 (×3): qty 2
  Filled 2018-01-04: qty 14
  Filled 2018-01-04: qty 2

## 2018-01-04 NOTE — [facility] Group Notes (Signed)
[facility] Group Notes:  (Nursing/MHT/Case Management/Adjunct)  Date:  01/04/2018  Time:  10:13 AM  Type of Therapy:  Psychoeducational Skills  Participation Level:  Active  Participation Quality:  Appropriate  Affect:  Appropriate  Cognitive:  Appropriate  Insight:  Appropriate  Engagement in Group:  Engaged  Modes of Intervention:  Problem-solving  Summary of Progress/Problems: Pt attended Psychoeducational group with top topic healthy support systems.   Jacquelyne BalintForrest, Yaniris Braddock Shanta 01/04/2018, 10:13 AM

## 2018-01-04 NOTE — [facility] Group Notes (Signed)
[facility] Group Notes:  (Nursing/MHT/Case Management/Adjunct)  Date:  01/04/2018  Time:  9:26 AM  Type of Therapy:  Orientation/Goals group  Participation Level:  Did Not Attend  Participation Quality:  Did Not Attend  Affect:  Did Not Attend  Cognitive:  Did Not Attend  Insight:  None  Engagement in Group:  Did Not Attend  Modes of Intervention:  Did Not Attend  Summary of Progress/Problems: Pt did not attend patient self inventory group/orientation group.  Jacquelyne BalintForrest, Samreen Seltzer Shanta 01/04/2018, 9:26 AM

## 2018-01-04 NOTE — Progress Notes (Addendum)
[facility] - Closed MD Progress Note  01/04/2018 2:03 PM Sarah Mcclure  MRN:  [id number]   Subjective: Sarah Mcclure presents with disruptive, irritable and agitated behaviors. 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 States she feel as if the Gabapentin is causing her anxiety to be worse. Continues to report the MD and staff is avoiding her every time she walks out of her room. " I am not stupid the door closes see look."  Patient is requesting to be discharge soon. Maybree seen pacing the unit and responding to internal stimuli.  Discussed decreasing  Neurontin from  400 mg to 200 mg for anxiety symptoms will Increased Abilify 15 to 20 mg for mood stabilization.  Reports a fair appetite and states she is resting well at night.  Denies medication side effects. Patient continues to request medications for anxiety, increased vistaril 75 mg. Support and encouragement and reassurance was provided.  Principal Problem: Bipolar affective disorder, manic, severe, with psychotic behavior (HCC) Diagnosis:   Patient Active Problem List   Diagnosis Date Noted  . Bipolar affective disorder, manic, severe, with psychotic behavior (HCC) [F31.2] 01/01/2018  . Bipolar affective disorder, currently manic, moderate (HCC) [F31.12] 12/31/2017  . Psychosis (HCC) [F29]   . Bipolar affective disorder, mixed, severe (HCC) [F31.63] 12/27/2017  . Bipolar I disorder, current or most recent episode depressed, with psychotic features (HCC) [F31.5] 12/16/2017  . Polysubstance (including opioids) dependence, daily use (HCC) [F19.20] 12/15/2017  . Breast lump on left side at 6 o'clock position [N63.20] 06/29/2013  . Breast lump on right side at 3 o'clock position [N63.10] 06/29/2013  . Cat allergies [J30.81] 05/25/2012  . Family history of systemic lupus erythematosus [Z82.69] 04/13/2012  . Back pain [M54.9] 04/13/2012  . Pleuritic chest pain [R07.81] 04/13/2012  . Shin splints  [N82.956O] 04/13/2012   Total Time spent with patient: 20 minutes  Past Psychiatric History:   Past Medical History:  Past Medical History:  Diagnosis Date  . Allergy   . Asthma    As a child  . Chronic back pain greater than 3 months duration 2013  . Depression   . Fibromyalgia    History reviewed. No pertinent surgical history. Family History:  Family History  Problem Relation Age of Onset  . Alcohol abuse Mother   . Arthritis Mother   . Lupus Father   . Alcohol abuse Father   . Heart disease Father   . Depression Father   . Heart disease Maternal Grandfather    Family Psychiatric  History:  Social History:  Social History   Substance and Sexual Activity  Alcohol Use No     Social History   Substance and Sexual Activity  Drug Use Yes  . Types: Marijuana, Other-see comments   Comment: molly    Social History   Socioeconomic History  . Marital status: Single    Spouse name: Not on file  . Number of children: Not on file  . Years of education: Not on file  . Highest education level: Not on file  Occupational History  . Not on file  Social Needs  . Financial resource strain: Not on file  . Food insecurity:    Worry: Not on file    Inability: Not on file  . Transportation needs:    Medical: Not on file    Non-medical: Not on file  Tobacco Use  . Smoking status: Current Some Day Smoker  Types: Cigarettes  . Smokeless tobacco: Never Used  Substance and Sexual Activity  . Alcohol use: No  . Drug use: Yes    Types: Marijuana, Other-see comments    Comment: molly  . Sexual activity: Yes    Birth control/protection: IUD  Lifestyle  . Physical activity:    Days per week: Not on file    Minutes per session: Not on file  . Stress: Not on file  Relationships  . Social connections:    Talks on phone: Not on file    Gets together: Not on file    Attends religious service: Not on file    Active member of club or organization: Not on file    Attends  meetings of clubs or organizations: Not on file    Relationship status: Not on file  Other Topics Concern  . Not on file  Social History Narrative  . Not on file   Additional Social History:                         Sleep: Good  Appetite:  Good  Current Medications: Current Facility-Administered Medications  Medication Dose Route Frequency Provider Last Rate Last Dose  . acetaminophen (TYLENOL) tablet 650 mg  650 mg Oral Q6H PRN Charm Rings, NP   650 mg at 01/04/18 0530  . alum & mag hydroxide-simeth (MAALOX/MYLANTA) 200-200-20 MG/5ML suspension 30 mL  30 mL Oral Q4H PRN Charm Rings, NP      . ARIPiprazole (ABILIFY) tablet 15 mg  15 mg Oral Daily Micheal Likens, MD   15 mg at 01/04/18 0820  . gabapentin (NEURONTIN) capsule 300 mg  300 mg Oral TID Armandina Stammer I, NP   300 mg at 01/04/18 1232  . hydrocortisone cream 1 %   Topical BID Charm Rings, NP      . hydrOXYzine (ATARAX/VISTARIL) tablet 50 mg  50 mg Oral Q6H PRN Charm Rings, NP   50 mg at 01/03/18 1518  . magnesium hydroxide (MILK OF MAGNESIA) suspension 30 mL  30 mL Oral Daily PRN Charm Rings, NP   30 mL at 01/04/18 0530  . nicotine polacrilex (NICORETTE) gum 2 mg  2 mg Oral PRN Charm Rings, NP   2 mg at 01/02/18 1835  . OLANZapine zydis (ZYPREXA) disintegrating tablet 10 mg  10 mg Oral Q8H PRN Micheal Likens, MD   10 mg at 01/04/18 0919  . ondansetron (ZOFRAN) tablet 4 mg  4 mg Oral Q8H PRN Charm Rings, NP      . Oxcarbazepine (TRILEPTAL) tablet 300 mg  300 mg Oral BID Charm Rings, NP   300 mg at 01/04/18 0820  . traZODone (DESYREL) tablet 100 mg  100 mg Oral QHS Armandina Stammer I, NP   100 mg at 01/02/18 2029    Lab Results:  No results found for this or any previous visit (from the past 48 hour(s)).  Blood Alcohol level:  Lab Results  Component Value Date   ETH <10 12/26/2017   ETH <10 12/12/2017    Metabolic Disorder Labs: Lab Results  Component Value Date    HGBA1C 5.0 12/17/2017   MPG 96.8 12/17/2017   Lab Results  Component Value Date   PROLACTIN 18.8 12/17/2017   Lab Results  Component Value Date   CHOL 128 12/17/2017   TRIG 76 12/17/2017   HDL 44 12/17/2017   CHOLHDL 2.9 12/17/2017   VLDL  15 12/17/2017   LDLCALC 69 12/17/2017    Physical Findings: AIMS: Facial and Oral Movements Muscles of Facial Expression: None, normal Lips and Perioral Area: None, normal Jaw: None, normal Tongue: None, normal,Extremity Movements Upper (arms, wrists, hands, fingers): None, normal Lower (legs, knees, ankles, toes): None, normal, Trunk Movements Neck, shoulders, hips: None, normal, Overall Severity Severity of abnormal movements (highest score from questions above): None, normal Incapacitation due to abnormal movements: None, normal Patient's awareness of abnormal movements (rate only patient's report): No Awareness, Dental Status Current problems with teeth and/or dentures?: No Does patient usually wear dentures?: No  CIWA:  CIWA-Ar Total: 0 COWS:  COWS Total Score: 1  Musculoskeletal: Strength & Muscle Tone: within normal limits Gait & Station: normal Patient leans: N/A  Psychiatric Specialty Exam: Physical Exam  Nursing note and vitals reviewed. Constitutional: She is oriented to person, place, and time. She appears well-developed.  Cardiovascular: Normal rate.  Neurological: She is alert and oriented to person, place, and time.  Skin: Skin is warm.  Psychiatric: She has a normal mood and affect.    Review of Systems  Psychiatric/Behavioral: Positive for depression. Negative for suicidal ideas. The patient is nervous/anxious.   All other systems reviewed and are negative.   Blood pressure 117/83, pulse 97, temperature 98.2 F (36.8 C), temperature source Oral, resp. rate 20, height 5\' 1"  (1.549 m), last menstrual period 12/29/2017, SpO2 99 %.Body mass index is 22.3 kg/m.  General Appearance: Bizarre, Casual and Guarded  irritable and agitated  Eye Contact:  Good  Speech:  Clear and Coherent  Volume:  Normal  Mood:  Dysphoric  Affect:  Congruent  Thought Process:  Coherent  Orientation:  Full (Time, Place, and Person)  Thought Content:  Paranoid Ideation and Rumination  Suicidal Thoughts:  No  Homicidal Thoughts:  No  Memory:  Immediate;   Poor Recent;   Fair Remote;   Fair  Judgement:  Fair  Insight:  Fair  Psychomotor Activity:  Normal  Concentration:  Concentration: Poor  Recall:  [employer] of Knowledge:  Fair  Language:  Fair  Akathisia:  No  Handed:  Right  AIMS (if indicated):     Assets:  Communication Skills Desire for Improvement Physical Health Resilience  ADL's:  Intact  Cognition:  WNL  Sleep:  Number of Hours: 4.5     Treatment Plan Summary: Daily contact with patient to assess and evaluate symptoms and progress in treatment and Medication management     Will continue today 01/04/2018 plan as below except where it is noted.  Mood control.     - Increased  Abilify 15 mg to 20 mg  po daily.  Agitation/anxiety.     - Decreased Gabapentin 400 mg  mg po tid to 200 mg  .     - Increased  Hydroxyzine 50 mg to 75 mg  po qid prn.  Nicotine withdrawal.     - Nicorette gum 2 mg po prn.  Agitation protocols.     - Continue Olanzapine zydis 10 mg po Q 8 hours prn.  Mood stabilization.     - Continue Trileptal 300 mg po bid.  Insomnia.    - Will Continue Trazodone 100 mg po Q hs.  Skin irration:  Bactroban 2% and Bactrim DS was mad available      - Patient to continue to participate in the group sessions.    - Discharge disposition ongoing.    - Reviewed labs, HIV reports: Non-reactive.  Oneta Rackanika N Lewis, NP 01/04/2018, 2:03 PM Agree with NP Progress Note

## 2018-01-04 NOTE — [facility] Group Notes (Signed)
[facility]  Date/Time:  01/04/2018  11:00AM-12:00PM  Type of Therapy and Topic:  Group Therapy:  Music and Mood  Participation Level:  Active   Description of Group: In this process group, members listened to a variety of genres of music and identified that different types of music evoke different responses.  Patients were encouraged to identify music that was soothing for them and music that was energizing for them.  Patients discussed how this knowledge can help with wellness and recovery in various ways including managing depression and anxiety as well as encouraging healthy sleep habits.    Therapeutic Goals: 1. Patients will explore the impact of different varieties of music on mood 2. Patients will verbalize the thoughts they have when listening to different types of music 3. Patients will identify music that is soothing to them as well as music that is energizing to them 4. Patients will discuss how to use this knowledge to assist in maintaining wellness and recovery 5. Patients will explore the use of music as a coping skill  Summary of Patient Progress:  At the beginning of group, patient expressed that her shoulder was hurting but otherwise she felt fine.  She participated thoroughly throughout group and at the end said her shoulders were both hurting quite severely, but she felt fine.  She said much of the music was bittersweet for her.  Therapeutic Modalities: Solution Focused Brief Therapy Activity   Ambrose MantleMareida Grossman-Orr, LCSW

## 2018-01-04 NOTE — Progress Notes (Signed)
Pt presents with a flat affect and labile mood. Pt noted to be easily agitated and verbally aggressive. Pt had to be given 2 mg of Ativan po due to agitation and pt yelling from the hallway to her room.  Pt calmed down after receiving meds and speaking with AC. Pt c/o Neurontin making her feel increasingly anxious and causing restless leg. Pt stated that she would like to have Neurontin tapered down. Pt discuss medication adjustments with NP. Pt c/o a wound to her left thumb. Pt noted to have a small healing wound to her thumb with no drainage of edma. Pt stated on an antibiotic per NP. Medications reviewed with pt. Verbal support provided. 15 minute checks performed for safety.

## 2018-01-04 NOTE — Progress Notes (Signed)
Pt was alert, up and talking when we met in her room. She was working on a puzzle when I arrived. Pt's conversation was mostly about being here against her will, she is here w/crazy people, she was put in her and she doesn't need to be she only needs to sleep. She said the staff does not understand her or know her. Pt said she needs to be home. Pt described her relationship w/her SO, whom she frequently referred to as her fiance, and how she does not know if they are still together or not. She said she was with someone else in November and they had a big fight that included them throwing things. Pt said they need to talk. She said it is not fair that she (SO) is doing whatever while she is here. She also described her relationship w/her mother. She said she gets anxiety w/her mom. She said she has PTSD because of her mother. She said her mother upsets her b/c she does not want her in a same sex relationship. She said her mother used to drink all the time and now she is saved and in the Bible. She said she has forgiven her mom for drinking. She said her mom also kept her from seeing her dad for four years and she is upset w/her about that. Pt repeated herself frequently but was consistent in her statements that she needs to go home and that she does not belong there. We talked about allowing [place] to take care of her while she is here until she is ready to go home. We talked about the relationships of her mom and SO and what she wants those relationships to look like and perhaps think of goals regarding each. She said her children are w/their dad. 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 Besides wanting to leave, pt was otherwise non-confrontational and composed during our meeting.  She moved around a lot, but was still consistent in wanting to leave and that was her last point of conversation w/me.  Please page if additional assistance is needed.  Following prayer pt expressed appreciation for the visit and even apologized for taking the time. I discouraged her from apologizing and offered continued support. [place], [place]   01/04/18 1900  Clinical Encounter Type  Visited With Patient

## 2018-01-04 NOTE — [facility] Counselor (Signed)
Clinical Social Work Note  At UnumProvidentmother's request, tried calling her Henreitta Cea(Mary Entwistle [phone]) but got a voicemail with her name/number on it.  Left a message with weekday CSW information on it, and said would ask CSW to call her tomorrow with updated information as appropriate.  Ambrose MantleMareida Grossman-Orr, LCSW 01/04/2018, 4:03 PM

## 2018-01-05 LAB — GC/CHLAMYDIA PROBE AMP (~~LOC~~) NOT AT ARMC
CHLAMYDIA, DNA PROBE: NEGATIVE
Neisseria Gonorrhea: NEGATIVE

## 2018-01-05 NOTE — Progress Notes (Signed)
[facility]  01/05/2018 2:55 PM Sarah MurphyLaura M Mcclure  MRN:  [id number]   Subjective: Sarah RiegerLaura reports, "I'm doing & feeling better. I try to pray before I do things because I keep acting before I could think. I have been reading the bible to stay focus. It has been difficult to focus. I'm having intrusive thoughts, like I go here & there while doing chores. I try very hard to stay on tasks as much as I can. Then, I decided to make a vision board for me to help me stay focused, get back to my true self; happy, caring & empathetic. I do have some anger issues too".  Sarah RiegerLaura is seen. Chart reviewed. The chart findings discussed with the treatment team. She is alert, oriented x 4, but disorganized in her thinking pattern. She presents with disorganized thoughts & hyper-religious behaviors. Her Neurontin & Abilify doses were adjusted previously.  Reports a fair appetite and states she is resting well at night. Denies medication side effects. Will continue on the current plan of care as noted below. No changes made. Sarah RiegerLaura is encouraged to continue to particpate in the group counseling sessions. Support and encouragement and reassurance was provided.  Principal Problem: Bipolar affective disorder, manic, severe, with psychotic behavior (HCC)  Diagnosis:   Patient Active Problem List   Diagnosis Date Noted  . Bipolar affective disorder, manic, severe, with psychotic behavior (HCC) [F31.2] 01/01/2018  . Bipolar affective disorder, currently manic, moderate (HCC) [F31.12] 12/31/2017  . Psychosis (HCC) [F29]   . Bipolar affective disorder, mixed, severe (HCC) [F31.63] 12/27/2017  . Bipolar I disorder, current or most recent episode depressed, with psychotic features (HCC) [F31.5] 12/16/2017  . Polysubstance (including opioids) dependence, daily use (HCC) [F19.20] 12/15/2017  . Breast lump on left side at 6 o'clock position [N63.20] 06/29/2013  . Breast lump on right side at 3 o'clock position [N63.10]  06/29/2013  . Cat allergies [J30.81] 05/25/2012  . Family history of systemic lupus erythematosus [Z82.69] 04/13/2012  . Back pain [M54.9] 04/13/2012  . Pleuritic chest pain [R07.81] 04/13/2012  . Shin splints [M57.846N][S86.899A] 04/13/2012   Total Time spent with patient: 15 minutes  Past Psychiatric History: See H&P  Past Medical History:  Past Medical History:  Diagnosis Date  . Allergy   . Asthma    As a child  . Chronic back pain greater than 3 months duration 2013  . Depression   . Fibromyalgia    History reviewed. No pertinent surgical history. Family History:  Family History  Problem Relation Age of Onset  . Alcohol abuse Mother   . Arthritis Mother   . Lupus Father   . Alcohol abuse Father   . Heart disease Father   . Depression Father   . Heart disease Maternal Grandfather    Family Psychiatric  History: See H&P. Social History:  Social History   Substance and Sexual Activity  Alcohol Use No     Social History   Substance and Sexual Activity  Drug Use Yes  . Types: Marijuana, Other-see comments   Comment: molly    Social History   Socioeconomic History  . Marital status: Single    Spouse name: Not on file  . Number of children: Not on file  . Years of education: Not on file  . Highest education level: Not on file  Occupational History  . Not on file  Social Needs  . Financial resource strain: Not on file  . Food insecurity:    Worry:  Not on file    Inability: Not on file  . Transportation needs:    Medical: Not on file    Non-medical: Not on file  Tobacco Use  . Smoking status: Current Some Day Smoker    Types: Cigarettes  . Smokeless tobacco: Never Used  Substance and Sexual Activity  . Alcohol use: No  . Drug use: Yes    Types: Marijuana, Other-see comments    Comment: molly  . Sexual activity: Yes    Birth control/protection: IUD  Lifestyle  . Physical activity:    Days per week: Not on file    Minutes per session: Not on file  .  Stress: Not on file  Relationships  . Social connections:    Talks on phone: Not on file    Gets together: Not on file    Attends religious service: Not on file    Active member of club or organization: Not on file    Attends meetings of clubs or organizations: Not on file    Relationship status: Not on file  Other Topics Concern  . Not on file  Social History Narrative  . Not on file   Additional Social History:   Sleep: Good  Appetite:  Good  Current Medications: Current Facility-Administered Medications  Medication Dose Route Frequency Provider Last Rate Last Dose  . acetaminophen (TYLENOL) tablet 650 mg  650 mg Oral Q6H PRN Charm Rings, NP   650 mg at 01/04/18 0530  . alum & mag hydroxide-simeth (MAALOX/MYLANTA) 200-200-20 MG/5ML suspension 30 mL  30 mL Oral Q4H PRN Charm Rings, NP      . ARIPiprazole (ABILIFY) tablet 20 mg  20 mg Oral Daily Oneta Rack, NP   20 mg at 01/05/18 0806  . gabapentin (NEURONTIN) capsule 200 mg  200 mg Oral TID Oneta Rack, NP   200 mg at 01/05/18 1200  . hydrocortisone cream 1 %   Topical BID Charm Rings, NP      . hydrOXYzine (ATARAX/VISTARIL) tablet 75 mg  75 mg Oral Q6H PRN Oneta Rack, NP   75 mg at 01/05/18 1201  . magnesium hydroxide (MILK OF MAGNESIA) suspension 30 mL  30 mL Oral Daily PRN Charm Rings, NP   30 mL at 01/04/18 0530  . mupirocin cream (BACTROBAN) 2 %   Topical BID Oneta Rack, NP      . nicotine polacrilex (NICORETTE) gum 2 mg  2 mg Oral PRN Charm Rings, NP   2 mg at 01/02/18 1835  . OLANZapine zydis (ZYPREXA) disintegrating tablet 10 mg  10 mg Oral Q8H PRN Micheal Likens, MD   10 mg at 01/05/18 1418  . ondansetron (ZOFRAN) tablet 4 mg  4 mg Oral Q8H PRN Charm Rings, NP      . Oxcarbazepine (TRILEPTAL) tablet 300 mg  300 mg Oral BID Charm Rings, NP   300 mg at 01/05/18 4098  . sulfamethoxazole-trimethoprim (BACTRIM DS,SEPTRA DS) 800-160 MG per tablet 1 tablet  1 tablet Oral  Q12H Oneta Rack, NP   1 tablet at 01/05/18 0806  . traZODone (DESYREL) tablet 100 mg  100 mg Oral QHS Nwoko, Nicole Kindred I, NP   100 mg at 01/05/18 0113    Lab Results:  No results found for this or any previous visit (from the past 48 hour(s)).  Blood Alcohol level:  Lab Results  Component Value Date   ETH <10 12/26/2017   ETH <10  12/12/2017    Metabolic Disorder Labs: Lab Results  Component Value Date   HGBA1C 5.0 12/17/2017   MPG 96.8 12/17/2017   Lab Results  Component Value Date   PROLACTIN 18.8 12/17/2017   Lab Results  Component Value Date   CHOL 128 12/17/2017   TRIG 76 12/17/2017   HDL 44 12/17/2017   CHOLHDL 2.9 12/17/2017   VLDL 15 12/17/2017   LDLCALC 69 12/17/2017    Physical Findings: AIMS: Facial and Oral Movements Muscles of Facial Expression: None, normal Lips and Perioral Area: None, normal Jaw: None, normal Tongue: None, normal,Extremity Movements Upper (arms, wrists, hands, fingers): None, normal Lower (legs, knees, ankles, toes): None, normal, Trunk Movements Neck, shoulders, hips: None, normal, Overall Severity Severity of abnormal movements (highest score from questions above): None, normal Incapacitation due to abnormal movements: None, normal Patient's awareness of abnormal movements (rate only patient's report): No Awareness, Dental Status Current problems with teeth and/or dentures?: No Does patient usually wear dentures?: No  CIWA:  CIWA-Ar Total: 0 COWS:  COWS Total Score: 1  Musculoskeletal: Strength & Muscle Tone: within normal limits Gait & Station: normal Patient leans: N/A  Psychiatric Specialty Exam: Physical Exam  Nursing note and vitals reviewed. Constitutional: She is oriented to person, place, and time. She appears well-developed.  Cardiovascular: Normal rate.  Neurological: She is alert and oriented to person, place, and time.  Skin: Skin is warm.  Psychiatric: She has a normal mood and affect.    Review of  Systems  Psychiatric/Behavioral: Positive for depression. Negative for suicidal ideas. The patient is nervous/anxious.   All other systems reviewed and are negative.   Blood pressure 117/83, pulse 97, temperature 98.2 F (36.8 C), temperature source Oral, resp. rate 20, height 5\' 1"  (1.549 m), last menstrual period 12/29/2017, SpO2 99 %.Body mass index is 22.3 kg/m.  General Appearance: Bizarre, Casual and Guarded irritable and agitated  Eye Contact:  Good  Speech:  Clear and Coherent  Volume:  Normal  Mood:  Dysphoric  Affect:  Congruent  Thought Process:  Coherent  Orientation:  Full (Time, Place, and Person)  Thought Content:  Paranoid Ideation and Rumination  Suicidal Thoughts:  No  Homicidal Thoughts:  No  Memory:  Immediate;   Poor Recent;   Fair Remote;   Fair  Judgement:  Fair  Insight:  Fair  Psychomotor Activity:  Normal  Concentration:  Concentration: Poor  Recall:  [employer] of Knowledge:  Fair  Language:  Fair  Akathisia:  No  Handed:  Right  AIMS (if indicated):     Assets:  Communication Skills Desire for Improvement Physical Health Resilience  ADL's:  Intact  Cognition:  WNL  Sleep:  Number of Hours: 2.5   Treatment Plan Summary: Daily contact with patient to assess and evaluate symptoms and progress in treatment and Medication management: Continue inpatient hospitalization  Will continue today 01/05/2018 plan as below except where it is noted.  Mood control.     - Continue Abilify 20 mg  po daily.  Agitation/anxiety.     - Continue Gabapentin  200 mg po tid  .     - Continue Hydroxyzine 50 mg po qid prn.  Nicotine withdrawal.     - Nicorette gum 2 mg po prn.  Agitation protocols.     - Continue Olanzapine zydis 10 mg po Q 8 hours prn.  Mood stabilization.     - Continue Trileptal 300 mg po bid.  Insomnia.    -  Will Continue Trazodone 100 mg po Q hs.  Skin irritation:  Bactroban 2% and Bactrim DS was mad available     - Patient to  continue to participate in the group sessions.    - Discharge disposition ongoing.     - Reviewed labs, HIV reports: Non-reactive, patient made aware.  Armandina Stammer, NP, pmhnp, FNP-BC 01/05/2018, 2:55 PM Patient ID: Sarah Mcclure, female   DOB: 06-02-1986, 32 y.o.   MRN: [id number]

## 2018-01-05 NOTE — Progress Notes (Signed)
Adult Psychoeducational Group Note  Date:  01/05/2018 Time:  9:12 PM  Group Topic/Focus:  Wrap-Up Group:   The focus of this group is to help patients review their daily goal of treatment and discuss progress on daily workbooks.  Participation Level:  Minimal  Participation Quality:  Appropriate  Affect:  Flat  Cognitive:  Appropriate  Insight: Appropriate  Engagement in Group:  Engaged  Modes of Intervention:  Socialization and Support  Additional Comments:  Pt. Attended and participated in group tonight. She reports that she enjoyed going outside and visitation from her pastor. She attended meals and went to group today.  Lita MainsFrancis, Denishia Citro [facility] 01/05/2018, 9:12 PM

## 2018-01-05 NOTE — [facility] Group Notes (Signed)
LCSW Group Therapy Note   01/05/2018 1:15pm   Type of Therapy and Topic:  Group Therapy:  Overcoming Obstacles   Participation Level:  Minimal   Description of Group:    In this group patients will be encouraged to explore what they see as obstacles to their own wellness and recovery. They will be guided to discuss their thoughts, feelings, and behaviors related to these obstacles. The group will process together ways to cope with barriers, with attention given to specific choices patients can make. Each patient will be challenged to identify changes they are motivated to make in order to overcome their obstacles. This group will be process-oriented, with patients participating in exploration of their own experiences as well as giving and receiving support and challenge from other group members.   Therapeutic Goals: 1. Patient will identify personal and current obstacles as they relate to admission. 2. Patient will identify barriers that currently interfere with their wellness or overcoming obstacles.  3. Patient will identify feelings, thought process and behaviors related to these barriers. 4. Patient will identify two changes they are willing to make to overcome these obstacles:      Summary of Patient Progress   In and out a couple of times-moved seats as well.  Not disruptive.  Engaged while present.  "I need to break things down into shorter goals."  When asked the first one, she stated it was to figure out where she is staying when she leaves "because I need to take a break from the relationship for awhile."  When asked why, she stated she needed to focus on herself.   Therapeutic Modalities:   Cognitive Behavioral Therapy Solution Focused Therapy Motivational Interviewing Relapse Prevention Therapy  Ida RogueRodney B Hagan Vanauken, LCSW 01/05/2018 2:49 PM

## 2018-01-05 NOTE — Progress Notes (Addendum)
Recreation Therapy Notes  Date: 3.25.19 Time: 10:00 am Location: [address] Dayroom   Group Topic: Coping Skills   Goal Area(s) Addresses:  Goal 1.1: To improve coping skills  . Group will identify at least one new coping skill  . Group will increase awareness on coping skills  . Group will be able to identify how coping skills can improve their wellness  Behavioral Response: Appropriate   Intervention: Crafts    Activity: Coping Skills Vision Board- Patients created a coping skills vision board using the magazines provided. Patients were expected to find at least three new coping skills as well as identify three coping skills they already have. Once the activity was completed, patients shared their vision board with the group.   Education: Radiographer, therapeutic, Self-Esteem   Education Outcome: Acknowledges education  Clinical Observations/Feedback: Patient attended and participated appropriately during Recreation Therapy group treatment. Patient actively engaged in group activity, successfully identifying what a coping skill was and how they can be used in her everyday life. Patient was able to identify three new coping skills they will like to use in the future. Patient was able to identify three coping skills they already have. Patient successfully met Goal 1.1 (see above).   Ranell Patrick, Recreation Therapy Intern  Ranell Patrick 01/05/2018 8:55 AM

## 2018-01-05 NOTE — Progress Notes (Signed)
Patient denies SI, HI and AVH.  Patient has been noted to be on the unit tearful and pacing.  Patient had to be redirected due to having a loud conversation in the phone yelling and cursing.   Assess patient for safety, offer medications as prescribed, engage patient in 1:1 staff talks.  Continue to monitor as planned. Patient needs much redirection due to labile mood.  

## 2018-01-06 MED ORDER — ARIPIPRAZOLE ER 400 MG IM SRER
400.0000 mg | INTRAMUSCULAR | Status: DC
  Administered 2018-01-06: 400 mg via INTRAMUSCULAR

## 2018-01-06 MED ORDER — HYDROXYZINE HCL 50 MG PO TABS
50.0000 mg | ORAL_TABLET | Freq: Four times a day (QID) | ORAL | Status: DC | PRN
  Administered 2018-01-06: 50 mg via ORAL
  Filled 2018-01-06: qty 1
  Filled 2018-01-06: qty 10

## 2018-01-06 NOTE — [facility] Counselor (Signed)
CSW spoke with Sarah Mcclure [phone]). Discussed family education resources (NAMI & MHA). Shared discharge information, as well as follow-up provider information. Patient to be discharged 01/07/18 and parent to come pick up patient at 11:30am.  Magdalene MollyPerri A Derrian Poli, LCSW

## 2018-01-06 NOTE — Progress Notes (Signed)
Patient has had decreased mood instability this shift.  Patient has required less redirection and was able to attend groups and had no incidents of behavioral dyscontrol.  Patient accepted long acting injectable antipsychotic stating "my family wants me to take this medication."  Assess patient for safety, offer medications as prescribed, engage a patient in 1:1 staff talks.   Patient able to contract for safety. Continue to monitor as planned.

## 2018-01-06 NOTE — Progress Notes (Signed)
[facility] MD Progress Note  01/06/2018 2:05 PM Sarah Mcclure  MRN:  [id number] Subjective:    Sarah Mcclure is a 32 y/o F with history of Bipolar I who was admitted from [facility] on IVC placed by [facility] after she presented with worsening symptoms of mania including intrusive thoughts to hurt herself, hyperverbal, distractible, responding to internal stimuli, and with poor medication adherence. She was restarted on previous discharge medication of trileptal and abilify was added to her regimen and titrated up during her stay. Pt had ongoing episodes of agitation and some paranoia during her stay, but she had incremental improvement of her presenting symptoms.  Today upon evaluation, pt shares, "I'm doing good today." Pt is apologetic for behaviors of yelling and slamming her door, and she demonstrates improved insight about her behaviors as disruptive. She denies any specific concerns today. She denies physical complaints. She reports that she still is adjusting to lower dose of gabapentin, but overall she feels that her medications have been helpful for her, especially the addition of abilify. She denies SI/HI/AH/VH. She is sleeping adequately. Her appetite is good. Discussed with patient about potential option of starting long-acting injectable form of Abilify Maintena, and pt was in agreement to start today. She agrees to continue her other current medications without changes. She had no further questions, comments, or concerns.  Principal Problem: Bipolar affective disorder, manic, severe, with psychotic behavior (HCC) Diagnosis:   Patient Active Problem List   Diagnosis Date Noted  . Bipolar affective disorder, manic, severe, with psychotic behavior (HCC) [F31.2] 01/01/2018  . Bipolar affective disorder, currently manic, moderate (HCC) [F31.12] 12/31/2017  . Psychosis (HCC) [F29]   . Bipolar affective disorder, mixed, severe (HCC) [F31.63] 12/27/2017  . Bipolar I disorder, current or most recent episode  depressed, with psychotic features (HCC) [F31.5] 12/16/2017  . Polysubstance (including opioids) dependence, daily use (HCC) [F19.20] 12/15/2017  . Breast lump on left side at 6 o'clock position [N63.20] 06/29/2013  . Breast lump on right side at 3 o'clock position [N63.10] 06/29/2013  . Cat allergies [J30.81] 05/25/2012  . Family history of systemic lupus erythematosus [Z82.69] 04/13/2012  . Back pain [M54.9] 04/13/2012  . Pleuritic chest pain [R07.81] 04/13/2012  . Shin splints [W09.811B] 04/13/2012   Total Time spent with patient: 30 minutes  Past Psychiatric History: see H&P  Past Medical History:  Past Medical History:  Diagnosis Date  . Allergy   . Asthma    As a child  . Chronic back pain greater than 3 months duration 2013  . Depression   . Fibromyalgia    History reviewed. No pertinent surgical history. Family History:  Family History  Problem Relation Age of Onset  . Alcohol abuse Mother   . Arthritis Mother   . Lupus Father   . Alcohol abuse Father   . Heart disease Father   . Depression Father   . Heart disease Maternal Grandfather    Family Psychiatric  History: see H&P Social History:  Social History   Substance and Sexual Activity  Alcohol Use No     Social History   Substance and Sexual Activity  Drug Use Yes  . Types: Marijuana, Other-see comments   Comment: molly    Social History   Socioeconomic History  . Marital status: Single    Spouse name: Not on file  . Number of children: Not on file  . Years of education: Not on file  . Highest education level: Not on file  Occupational History  .  Not on file  Social Needs  . Financial resource strain: Not on file  . Food insecurity:    Worry: Not on file    Inability: Not on file  . Transportation needs:    Medical: Not on file    Non-medical: Not on file  Tobacco Use  . Smoking status: Current Some Day Smoker    Types: Cigarettes  . Smokeless tobacco: Never Used  Substance and Sexual  Activity  . Alcohol use: No  . Drug use: Yes    Types: Marijuana, Other-see comments    Comment: molly  . Sexual activity: Yes    Birth control/protection: IUD  Lifestyle  . Physical activity:    Days per week: Not on file    Minutes per session: Not on file  . Stress: Not on file  Relationships  . Social connections:    Talks on phone: Not on file    Gets together: Not on file    Attends religious service: Not on file    Active member of club or organization: Not on file    Attends meetings of clubs or organizations: Not on file    Relationship status: Not on file  Other Topics Concern  . Not on file  Social History Narrative  . Not on file   Additional Social History:                         Sleep: Good  Appetite:  Good  Current Medications: Current Facility-Administered Medications  Medication Dose Route Frequency Provider Last Rate Last Dose  . acetaminophen (TYLENOL) tablet 650 mg  650 mg Oral Q6H PRN Charm Rings, NP   650 mg at 01/05/18 2114  . alum & mag hydroxide-simeth (MAALOX/MYLANTA) 200-200-20 MG/5ML suspension 30 mL  30 mL Oral Q4H PRN Charm Rings, NP      . ARIPiprazole (ABILIFY) tablet 20 mg  20 mg Oral Daily Oneta Rack, NP   20 mg at 01/06/18 0739  . ARIPiprazole ER (ABILIFY MAINTENA) injection 400 mg  400 mg Intramuscular Q28 days Jolyne Loa T, MD      . gabapentin (NEURONTIN) capsule 200 mg  200 mg Oral TID Oneta Rack, NP   200 mg at 01/06/18 0739  . hydrocortisone cream 1 %   Topical BID Charm Rings, NP      . hydrOXYzine (ATARAX/VISTARIL) tablet 75 mg  75 mg Oral Q6H PRN Oneta Rack, NP   75 mg at 01/06/18 0843  . magnesium hydroxide (MILK OF MAGNESIA) suspension 30 mL  30 mL Oral Daily PRN Charm Rings, NP   30 mL at 01/04/18 0530  . mupirocin cream (BACTROBAN) 2 %   Topical BID Oneta Rack, NP      . nicotine polacrilex (NICORETTE) gum 2 mg  2 mg Oral PRN Charm Rings, NP   2 mg at 01/02/18  1835  . OLANZapine zydis (ZYPREXA) disintegrating tablet 10 mg  10 mg Oral Q8H PRN Micheal Likens, MD   10 mg at 01/05/18 1418  . ondansetron (ZOFRAN) tablet 4 mg  4 mg Oral Q8H PRN Charm Rings, NP      . Oxcarbazepine (TRILEPTAL) tablet 300 mg  300 mg Oral BID Charm Rings, NP   300 mg at 01/06/18 0740  . sulfamethoxazole-trimethoprim (BACTRIM DS,SEPTRA DS) 800-160 MG per tablet 1 tablet  1 tablet Oral Q12H Oneta Rack, NP   1  tablet at 01/06/18 0740  . traZODone (DESYREL) tablet 100 mg  100 mg Oral QHS Armandina StammerNwoko, Agnes I, NP   100 mg at 01/05/18 2114    Lab Results: No results found for this or any previous visit (from the past 48 hour(s)).  Blood Alcohol level:  Lab Results  Component Value Date   ETH <10 12/26/2017   ETH <10 12/12/2017    Metabolic Disorder Labs: Lab Results  Component Value Date   HGBA1C 5.0 12/17/2017   MPG 96.8 12/17/2017   Lab Results  Component Value Date   PROLACTIN 18.8 12/17/2017   Lab Results  Component Value Date   CHOL 128 12/17/2017   TRIG 76 12/17/2017   HDL 44 12/17/2017   CHOLHDL 2.9 12/17/2017   VLDL 15 12/17/2017   LDLCALC 69 12/17/2017    Physical Findings: AIMS: Facial and Oral Movements Muscles of Facial Expression: None, normal Lips and Perioral Area: None, normal Jaw: None, normal Tongue: None, normal,Extremity Movements Upper (arms, wrists, hands, fingers): None, normal Lower (legs, knees, ankles, toes): None, normal, Trunk Movements Neck, shoulders, hips: None, normal, Overall Severity Severity of abnormal movements (highest score from questions above): None, normal Incapacitation due to abnormal movements: None, normal Patient's awareness of abnormal movements (rate only patient's report): No Awareness, Dental Status Current problems with teeth and/or dentures?: No Does patient usually wear dentures?: No  CIWA:  CIWA-Ar Total: 0 COWS:  COWS Total Score: 1  Musculoskeletal: Strength & Muscle Tone:  within normal limits Gait & Station: normal Patient leans: N/A  Psychiatric Specialty Exam: Physical Exam  Nursing note and vitals reviewed.   Review of Systems  Constitutional: Negative for chills and fever.  Respiratory: Negative for cough and shortness of breath.   Gastrointestinal: Negative for abdominal pain, heartburn, nausea and vomiting.  Psychiatric/Behavioral: Negative for depression, hallucinations and suicidal ideas. The patient is not nervous/anxious and does not have insomnia.     Blood pressure 119/83, pulse (!) 111, temperature 98.2 F (36.8 C), temperature source Oral, resp. rate 18, height 5\' 1"  (1.549 m), last menstrual period 12/29/2017, SpO2 99 %.Body mass index is 22.3 kg/m.  General Appearance: Casual and Fairly Groomed  Eye Contact:  Good  Speech:  Clear and Coherent and Normal Rate  Volume:  Normal  Mood:  Euthymic  Affect:  Appropriate, Congruent and Constricted  Thought Process:  Coherent and Goal Directed  Orientation:  Full (Time, Place, and Person)  Thought Content:  Logical  Suicidal Thoughts:  No  Homicidal Thoughts:  No  Memory:  Immediate;   Fair Recent;   Fair Remote;   Fair  Judgement:  Fair  Insight:  Fair  Psychomotor Activity:  Normal  Concentration:  Concentration: Fair  Recall:  [employer]Fair  Fund of Knowledge:  Fair  Language:  Fair  Akathisia:  No  Handed:    AIMS (if indicated):     Assets:  Communication Skills Resilience Social Support  ADL's:  Intact  Cognition:  WNL  Sleep:  Number of Hours: 6.25   Treatment Plan Summary: Daily contact with patient to assess and evaluate symptoms and progress in treatment and Medication management    - Continue inpatient hospitalization  - Bipolar I, current episode manic with psychotic features             - Continue abilify 20mg  po qDay   -Start Abilify Maintena 400mg  IM q28 days (give first dose today 3/26)             -  Continue trileptal 300mg  po BID  - Anxiety             -  Continue atarax 50mg  po q6h prn anxiety   - Continue gabapentin 200mg  po TID  -Agitation             -Continue agitation protocol with zydis/ativan/geodon PRN agitation  -Insomnia.   -Continue Trazodone 100 mg po qhs  -Skin irritation:             -Continue Bactroban 2% apply topically to affected area PRN   -Continue Bactrim DS 800-160mg  1 tablet q12h  -Nicotine withdrawal.   - Nicorette gum 2 mg po prn.     - Patient to continue to participate in the group sessions.    - Discharge disposition ongoing.   Micheal Likens, MD 01/06/2018, 2:05 PM

## 2018-01-06 NOTE — [facility] Group Notes (Signed)
LCSW Group Therapy Notes 01/06/2018 1:15pm Type of Therapy and Topic:  Group Therapy:  Communication Participation Level:  Active  Description of Group: Patients will identify how individuals communicate with one another appropriately and inappropriately.  Patients will be guided to discuss their thoughts, feelings and behaviors related to barriers when communicating.  The group will process together ways to execute positive and appropriate communication with attention given to how one uses behavior, tone and body language.  Patients will be encouraged to reflect on a situation where they were successfully able to communicate and what made this example successful.  Group will identify specific changes they are motivated to make in order to overcome communication barriers with self, peers, authority, and parents.  This group will be process-oriented with patients participating in exploration of their own experiences, giving and receiving support, and challenging self and other group members.   Therapeutic Goals 1. Patient will identify how people communicate (body language, facial expression, and electronics).  Group will also discuss tone, voice and how these impact what is communicated and what is received. 2. Patient will identify feelings (such as fear or worry), thought process and behaviors related to why people internalize feelings rather than express self openly. 3. Patient will identify two changes they are willing to make to overcome communication barriers 4. Members will then practice through role play how to communicate using I statements, I feel statements, and acknowledging feelings rather than displacing feelings on others Summary of Patient Progress:  Stayed the entire time, engaged throughout.  C/O headache-switched from laying down to sitting up several times.  Talked about her desire to get better and stay out of the hospital.  Difficult time identifying what would be different when  she left, but finally decided it would be focusing on her self instead of the relationship.   Therapeutic Modalities Cognitive Behavioral Therapy Motivational Interviewing Solution Focused Therapy  Ida RogueRodney B Kendel Pesnell, LCSW 01/06/2018 1:15 PM

## 2018-01-06 NOTE — Progress Notes (Signed)
Patient verbalized that she learned a great deal from the groups today. She also stated that she worked on using the support that has been given to her in the hospital and that she needs to have more support upon discharge from the hospital. Her goal for tomorrow is to try not to do things on her own and ask for help.

## 2018-01-06 NOTE — Progress Notes (Signed)
Recreation Therapy Notes  Date: 3.26.19 Time: 10:00 a.m.  Location: [address] Dayroom  Group Topic: Social Skills   Goal Area(s) Addresses:  Goal 1.1: To improve social skills  - Patient will participate in Recreation Therapy group tx. - Patient will identify the importance of good social skills  - Patient will communicate with peers during group activity   Behavioral Response: Appropriate  Intervention: Game   Activity: "I have it": Group were handed all the cards from a deck. The first patient rolled the dice to try to match the number of any of the cards they have. If they have it, they say I've got it and throws in the center, and continues to roll, if they don't have it, then it goes around to the next player that does.   Education: Social Skills   Education Outcome: Acknowledges Education  Clinical Observations/Feedback: Patient attended and participated appropriately during Recreation Therapy group treatment. Patient communicated with peers in a appropriate manner. Patient left group early.   Sheryle Hailarian [place] Dodgen, Recreation Therapy Intern   Sheryle HailDarian Maeley Matton 01/06/2018 9:09 AM

## 2018-01-06 NOTE — Progress Notes (Signed)
DAR Note: Pt observed pacing angrily on the hallway; "there is a lot going on in my mind; people here don't understand me." Pt lacks insight; "I can do this on my own; I only have some anger problem that's all." Pt endorsed moderate anxiety and depression. Denied SI, HI or AVH. Pt was med compliant. Pt attended wrap-up group. Will continue to monitor for safety.

## 2018-01-07 ENCOUNTER — Telehealth (HOSPITAL_COMMUNITY): Payer: Self-pay | Admitting: Professional

## 2018-01-07 MED ORDER — HYDROCORTISONE 1 % EX CREA: TOPICAL_CREAM | Freq: Two times a day (BID) | CUTANEOUS | 0 refills | Status: DC

## 2018-01-07 MED ORDER — TRAZODONE HCL 100 MG PO TABS: 100.0000 mg | ORAL_TABLET | Freq: Every day | ORAL | 0 refills | Status: DC

## 2018-01-07 MED ORDER — ARIPIPRAZOLE 20 MG PO TABS: 20.0000 mg | ORAL_TABLET | Freq: Every day | ORAL | 0 refills | Status: DC

## 2018-01-07 MED ORDER — ARIPIPRAZOLE ER 400 MG IM SRER: 400.0000 mg | INTRAMUSCULAR | 0 refills | Status: DC

## 2018-01-07 MED ORDER — SULFAMETHOXAZOLE-TRIMETHOPRIM 800-160 MG PO TABS: 1.0000 | ORAL_TABLET | Freq: Two times a day (BID) | ORAL | Status: DC

## 2018-01-07 MED ORDER — NICOTINE POLACRILEX 2 MG MT GUM: 2.0000 mg | CHEWING_GUM | OROMUCOSAL | 0 refills | Status: DC | PRN

## 2018-01-07 MED ORDER — HYDROXYZINE HCL 50 MG PO TABS: 50.0000 mg | ORAL_TABLET | Freq: Four times a day (QID) | ORAL | 0 refills | Status: DC | PRN

## 2018-01-07 MED ORDER — OXCARBAZEPINE 300 MG PO TABS: 300.0000 mg | ORAL_TABLET | Freq: Two times a day (BID) | ORAL | 0 refills | Status: DC

## 2018-01-07 MED ORDER — MUPIROCIN CALCIUM 2 % EX CREA: TOPICAL_CREAM | Freq: Two times a day (BID) | CUTANEOUS | 0 refills | Status: DC

## 2018-01-07 MED ORDER — GABAPENTIN 100 MG PO CAPS: 200.0000 mg | ORAL_CAPSULE | Freq: Three times a day (TID) | ORAL | 0 refills | Status: DC

## 2018-01-07 NOTE — [facility] Suicide Risk Assessment (Signed)
[facility] Discharge Suicide Risk Assessment   Principal Problem: Bipolar affective disorder, manic, severe, with psychotic behavior [facility]) Discharge Diagnoses:  Patient Active Problem List   Diagnosis Date Noted  . Bipolar affective disorder, manic, severe, with psychotic behavior (HCC) [F31.2] 01/01/2018  . Bipolar affective disorder, currently manic, moderate (HCC) [F31.12] 12/31/2017  . Psychosis (HCC) [F29]   . Bipolar affective disorder, mixed, severe (HCC) [F31.63] 12/27/2017  . Bipolar I disorder, current or most recent episode depressed, with psychotic features (HCC) [F31.5] 12/16/2017  . Polysubstance (including opioids) dependence, daily use (HCC) [F19.20] 12/15/2017  . Breast lump on left side at 6 o'clock position [N63.20] 06/29/2013  . Breast lump on right side at 3 o'clock position [N63.10] 06/29/2013  . Cat allergies [J30.81] 05/25/2012  . Family history of systemic lupus erythematosus [Z82.69] 04/13/2012  . Back pain [M54.9] 04/13/2012  . Pleuritic chest pain [R07.81] 04/13/2012  . Shin splints [Z61.096E] 04/13/2012    Total Time spent with patient: 30 minutes  Musculoskeletal: Strength & Muscle Tone: within normal limits Gait & Station: normal Patient leans: N/A  Psychiatric Specialty Exam: Review of Systems  Constitutional: Negative for chills and fever.  Respiratory: Negative for cough and shortness of breath.   Cardiovascular: Negative for chest pain.  Gastrointestinal: Negative for abdominal pain, heartburn, nausea and vomiting.  Psychiatric/Behavioral: Negative for depression, hallucinations and suicidal ideas. The patient is not nervous/anxious and does not have insomnia.     Blood pressure 121/80, pulse 89, temperature 97.7 F (36.5 C), temperature source Oral, resp. rate 16, height 5\' 1"  (1.549 m), last menstrual period 12/29/2017, SpO2 99 %.Body mass index is 22.3 kg/m.  General Appearance: Casual and Fairly Groomed  Patent attorney::  Good  Speech:  Clear and  Coherent and Normal Rate  Volume:  Normal  Mood:  Euthymic  Affect:  Appropriate and Congruent  Thought Process:  Coherent and Goal Directed  Orientation:  Full (Time, Place, and Person)  Thought Content:  Logical  Suicidal Thoughts:  No  Homicidal Thoughts:  No  Memory:  Immediate;   Fair Recent;   Fair Remote;   Fair  Judgement:  Fair  Insight:  Fair  Psychomotor Activity:  Normal  Concentration:  Fair  Recall:  [employer] of Knowledge:Fair  Language: Fair  Akathisia:  No  Handed:    AIMS (if indicated):     Assets:  Communication Skills Physical Health Resilience  Sleep:  Number of Hours: 4.25  Cognition: WNL  ADL's:  Intact   Mental Status Per Nursing Assessment::   On Admission:     Demographic Factors:  Caucasian and Low socioeconomic status  Loss Factors: Financial problems/change in socioeconomic status  Historical Factors: Impulsivity  Risk Reduction Factors:   Sense of responsibility to family, Living with another person, especially a relative, Positive social support, Positive therapeutic relationship and Positive coping skills or problem solving skills  Continued Clinical Symptoms:  Bipolar Disorder:   Mixed State  Cognitive Features That Contribute To Risk:  None    Suicide Risk:  Minimal: No identifiable suicidal ideation.  Patients presenting with no risk factors but with morbid ruminations; may be classified as minimal risk based on the severity of the depressive symptoms  Follow-up Information    [facility] Go on 01/13/2018.   Why:  Appointment is scheduled for Tuesday at 8am. Please bring your ID and hospital discharge paperwork with you! Contact information: [address] [place] [address] [phone]  Subjective Data:  Sarah Mcclure is a 32 y/o F with history of Bipolar I who [facility] she presented with worsening symptoms of mania including intrusive thoughts to hurt herself,  hyperverbal, distractible, responding to internal stimuli, and with poor medication adherence. She was restarted on previous discharge medication of trileptal and abilify was added to her regimen and titrated up during her stay. Pt had ongoing episodes of agitation and some paranoia during her stay, but she had incremental improvement of her presenting symptoms.  Today upon evaluation, pt shares, "I'm a little restless today; I was up over the night, but eventually I got my eight hours." She reports that overall she is doing well and she feels much improved compared to her initial presentation. She denies any specific concerns. She denies physical complaints aside from some mild shoulder pain, which patient reports she had prior to admission. She notes that she is tolerating her medications well including the Abilify Maintena injection which she received yesterday. Pt plans to stay with her mother after discharge, and she is future oriented about going to a job fair after discharge. She was able to engage in safety planning including [facility] or contact emergency services if she feels unable to maintain her own safety or the safety of others. Pt had no further questions, comments, or concerns.   Plan Of Care/Follow-up recommendations:   - Discharge to outpatient level of care  - Bipolar I, current episode manic with psychotic features - Continue abilify 20mg  po qDay (continue for at least 2 weeks after discharge)             -Continue Abilify Maintena 400mg  IM q28 days (give first dose given 3/26) - Continue trileptal 300mg  po BID  - Anxiety - Continue atarax 50mg  po q6h prn anxiety             - Continue gabapentin 200mg  po TID  -Insomnia.             -Continue Trazodone 100 mg po qhs  Activity:  as tolerated Diet:  normal Tests:  NA Other:  see above for DC plan  Micheal Likenshristopher T Ashford Clouse, MD 01/07/2018, 8:47 AM

## 2018-01-07 NOTE — Discharge Summary (Addendum)
Physician Discharge Summary Note  Patient:  Sarah Mcclure is an 32 y.o., female  MRN:  [id number]  DOB:  1986/02/11  Patient phone:  [phone] (home)   Patient address:   [address] [place] [address],   Total Time spent with patient: Greater than 30 minutes  Date of Admission:  01/01/2018  Date of Discharge: 01-07-18  Reason for Admission: Worsening symptoms of depression, suicidal ideations, illicit drug use & hallucinations.  Principal Problem: Bipolar affective disorder, manic, severe, with psychotic behavior [facility](HCC)  Discharge Diagnoses: Patient Active Problem List   Diagnosis Date Noted  . Bipolar affective disorder, manic, severe, with psychotic behavior (HCC) [F31.2] 01/01/2018  . Bipolar affective disorder, currently manic, moderate (HCC) [F31.12] 12/31/2017  . Psychosis (HCC) [F29]   . Bipolar affective disorder, mixed, severe (HCC) [F31.63] 12/27/2017  . Bipolar I disorder, current or most recent episode depressed, with psychotic features (HCC) [F31.5] 12/16/2017  . Polysubstance (including opioids) dependence, daily use (HCC) [F19.20] 12/15/2017  . Breast lump on left side at 6 o'clock position [N63.20] 06/29/2013  . Breast lump on right side at 3 o'clock position [N63.10] 06/29/2013  . Cat allergies [J30.81] 05/25/2012  . Family history of systemic lupus erythematosus [Z82.69] 04/13/2012  . Back pain [M54.9] 04/13/2012  . Pleuritic chest pain [R07.81] 04/13/2012  . Shin splints [O13.086V][S86.899A] 04/13/2012   Past Psychiatric History: Hx. Polysubstance use disorder, Bipolar 1 disorder.  Past Medical History:  Past Medical History:  Diagnosis Date  . Allergy   . Asthma    As a child  . Chronic back pain greater than 3 months duration 2013  . Depression   . Fibromyalgia    History reviewed. No pertinent surgical history.  Family History:  Family History  Problem Relation Age of Onset  . Alcohol abuse Mother   . Arthritis Mother   . Lupus Father    . Alcohol abuse Father   . Heart disease Father   . Depression Father   . Heart disease Maternal Grandfather    Family Psychiatric  History: See H&P. Social History:  Social History   Substance and Sexual Activity  Alcohol Use No     Social History   Substance and Sexual Activity  Drug Use Yes  . Types: Marijuana, Other-see comments   Comment: molly    Social History   Socioeconomic History  . Marital status: Single    Spouse name: Not on file  . Number of children: Not on file  . Years of education: Not on file  . Highest education level: Not on file  Occupational History  . Not on file  Social Needs  . Financial resource strain: Not on file  . Food insecurity:    Worry: Not on file    Inability: Not on file  . Transportation needs:    Medical: Not on file    Non-medical: Not on file  Tobacco Use  . Smoking status: Current Some Day Smoker    Types: Cigarettes  . Smokeless tobacco: Never Used  Substance and Sexual Activity  . Alcohol use: No  . Drug use: Yes    Types: Marijuana, Other-see comments    Comment: molly  . Sexual activity: Yes    Birth control/protection: IUD  Lifestyle  . Physical activity:    Days per week: Not on file    Minutes per session: Not on file  . Stress: Not on file  Relationships  . Social connections:    Talks on phone:  Not on file    Gets together: Not on file    Attends religious service: Not on file    Active member of club or organization: Not on file    Attends meetings of clubs or organizations: Not on file    Relationship status: Not on file  Other Topics Concern  . Not on file  Social History Narrative  . Not on file   Hospital Course: (Per Md's admission assessment): Sarah Mcclure is a 32 y/o F with history of Bipolar I who was admitted from [facility] on IVC placed by [facility] after she presented with worsening symptoms of mania including intrusive thoughts to hurt herself, hyperverbal, distractible, responding to  internal stimuli, and with poor medication adherence.She was restarted on previous discharge medication of trileptal and abilify was added to her regimen and titrated up during her stay. Pt had ongoing episodes of agitation and some paranoia during her stay, but she had incremental improvement of her presenting symptoms.  Besides the Abilify 20 mg for mood control & Trileptal 300 mg for mood stabilization, Sarah Mcclure was also medicated & discharged on; Gabapentin 200 mg for agitation, Hydroxyzine 50 mg prn for anxiety, Nicorette gum 2 mg for smoking cessation & Trazodone 100 mg for insomnia. She also received other medication regimen for the other medical issues presented. She tolerated her treatment regimen without any adverse effects or reactions reported. She was enrolled & participated in the group counseling sessions being offered & held on this unit. She learned coping skills.  Today upon her discharge evaluation, pt shares, "I'm a little restless today; I was up over the night, but eventually I got my eight hours." She reports that overall she is doing well and she feels much improved compared to her initial presentation. She denies any specific concerns. She denies physical complaints aside from some mild shoulder pain, which patient reports she had prior to admission. She notes that she is tolerating her medications well including the Abilify Maintena injection which she received yesterday. Pt plans to stay with her mother after discharge, and she is future oriented about going to a job fair after discharge. She was able to engage in safety planning including plan to return to [facility] or contact emergency services if she feels unable to maintain her own safety or the safety of others. Pt had no further questions, comments, or concerns.  Upon discharge, Sarah Mcclure presented both mentally & medically stable. She denies suicidal/homicidal ideations, auditory/visual/tactile hallucinations, delusional thoughts or  paranoia. She received from the [facility] pharmacy, a 7 days worth, supply samples of her [facility] discharge medications. She left [facility] with all belongings in no distress. Transportation per her mother..  Physical Findings: AIMS: Facial and Oral Movements Muscles of Facial Expression: None, normal Lips and Perioral Area: None, normal Jaw: None, normal Tongue: None, normal,Extremity Movements Upper (arms, wrists, hands, fingers): None, normal Lower (legs, knees, ankles, toes): None, normal, Trunk Movements Neck, shoulders, hips: None, normal, Overall Severity Severity of abnormal movements (highest score from questions above): None, normal Incapacitation due to abnormal movements: None, normal Patient's awareness of abnormal movements (rate only patient's report): No Awareness, Dental Status Current problems with teeth and/or dentures?: No Does patient usually wear dentures?: No  CIWA:  CIWA-Ar Total: 0 COWS:  COWS Total Score: 1  Musculoskeletal: Strength & Muscle Tone: within normal limits Gait & Station: normal Patient leans: N/A  Psychiatric Specialty Exam: Physical Exam  Constitutional: She appears well-developed.  HENT:  Head: Normocephalic.  Eyes: Pupils are  equal, round, and reactive to light.  Neck: Normal range of motion.  Cardiovascular: Normal rate.  Respiratory: Effort normal.  GI: Soft.  Genitourinary:  Genitourinary Comments: Deferred  Musculoskeletal: Normal range of motion.  Neurological: She is alert.  Skin: Skin is warm.    Review of Systems  Constitutional: Negative.   HENT: Negative.   Eyes: Negative.   Respiratory: Negative.   Cardiovascular: Negative.   Gastrointestinal: Negative.   Genitourinary: Negative.   Musculoskeletal: Negative.   Skin: Negative.   Neurological: Negative.   Endo/Heme/Allergies: Negative.   Psychiatric/Behavioral: Positive for depression (Stable), hallucinations (Hx. psychosis) and substance abuse (Hx. Opioid use disorder). Negative  for memory loss and suicidal ideas. The patient has insomnia (Stable). The patient is not nervous/anxious.     Blood pressure 121/80, pulse 89, temperature 97.7 F (36.5 C), temperature source Oral, resp. rate 16, height 5\' 1"  (1.549 m), last menstrual period 12/29/2017, SpO2 99 %.Body mass index is 22.3 kg/m.  See Md's SRA   Have you used any form of tobacco in the last 30 days? (Cigarettes, Smokeless Tobacco, Cigars, and/or Pipes): Yes  Has this patient used any form of tobacco in the last 30 days? (Cigarettes, Smokeless Tobacco, Cigars, and/or Pipes): Yes, an FDA-approved tobacco cessation medication was offered at discharge.  Blood Alcohol level:  Lab Results  Component Value Date   ETH <10 12/26/2017   ETH <10 12/12/2017   Metabolic Disorder Labs:  Lab Results  Component Value Date   HGBA1C 5.0 12/17/2017   MPG 96.8 12/17/2017   Lab Results  Component Value Date   PROLACTIN 18.8 12/17/2017   Lab Results  Component Value Date   CHOL 128 12/17/2017   TRIG 76 12/17/2017   HDL 44 12/17/2017   CHOLHDL 2.9 12/17/2017   VLDL 15 12/17/2017   LDLCALC 69 12/17/2017   See Psychiatric Specialty Exam and Suicide Risk Assessment completed by Attending Physician prior to discharge.  Discharge destination:  Home  Is patient on multiple antipsychotic therapies at discharge:  No   Has Patient had three or more failed trials of antipsychotic monotherapy by history:  No  Recommended Plan for Multiple Antipsychotic Therapies: NA  Allergies as of 01/07/2018      Reactions   Amoxicillin Other (See Comments)   Has patient had a PCN reaction causing immediate rash, facial/tongue/throat swelling, SOB or lightheadedness with hypotension: Unknown Has patient had a PCN reaction causing severe rash involving mucus membranes or skin necrosis: No Has patient had a PCN reaction that required hospitalization: Unknown Has patient had a PCN reaction occurring within the last 10 years: No If all  of the above answers are "NO", then may proceed with Cephalosporin use.   Penicillins Other (See Comments)   Has patient had a PCN reaction causing immediate rash, facial/tongue/throat swelling, SOB or lightheadedness with hypotension: Unknown Has patient had a PCN reaction causing severe rash involving mucus membranes or skin necrosis: No Has patient had a PCN reaction that required hospitalization: Unknown Has patient had a PCN reaction occurring within the last 10 years: No If all of the above answers are "NO", then may proceed with Cephalosporin use.      Medication List    TAKE these medications     Indication  ARIPiprazole 20 MG tablet Commonly known as:  ABILIFY Take 1 tablet (20 mg total) by mouth daily. For mood control Start taking on:  01/08/2018 What changed:    medication strength  how much to take  Indication:  Mood control   ARIPiprazole ER 400 MG Srer injection Commonly known as:  ABILIFY MAINTENA Inject 2 mLs (400 mg total) into the muscle every 28 (twenty-eight) days. (Due on 02-03-18): For mood control Start taking on:  02/03/2018 What changed:  You were already taking a medication with the same name, and this prescription was added. Make sure you understand how and when to take each.  Indication:  Mood control   gabapentin 100 MG capsule Commonly known as:  NEURONTIN Take 2 capsules (200 mg total) by mouth 3 (three) times daily. For agitation What changed:    medication strength  how much to take  Indication:  Agitation   hydrocortisone cream 1 % Apply topically 2 (two) times daily. For itching  Indication:  Itching   hydrOXYzine 50 MG tablet Commonly known as:  ATARAX/VISTARIL Take 1 tablet (50 mg total) by mouth every 6 (six) hours as needed for anxiety.  Indication:  Feeling Anxious   mupirocin cream 2 % Commonly known as:  BACTROBAN Apply topically 2 (two) times daily. For wound care  Indication:  Wound care   nicotine polacrilex 2 MG  gum Commonly known as:  NICORETTE Take 1 each (2 mg total) by mouth as needed for smoking cessation. (May purchase from over the counter): For smoking cessation  Indication:  Nicotine Addiction   Oxcarbazepine 300 MG tablet Commonly known as:  TRILEPTAL Take 1 tablet (300 mg total) by mouth 2 (two) times daily. For mood stabilization  Indication:  Mood stabilization   sulfamethoxazole-trimethoprim 800-160 MG tablet Commonly known as:  BACTRIM DS,SEPTRA DS Take 1 tablet by mouth every 12 (twelve) hours. For urinary tract infection  Indication:  Urinary tract infection   traZODone 100 MG tablet Commonly known as:  DESYREL Take 1 tablet (100 mg total) by mouth at bedtime. For sleep What changed:    medication strength  how much to take  Indication:  Trouble Sleeping      Follow-up Information    [facility]. Go on 01/13/2018.   Why:  Appointment is scheduled for Tuesday at 8am. Please bring your ID and hospital discharge paperwork with you! Contact information: [address] [place] [address] [phone]          Follow-up recommendations:  Activity:  As tolerated Diet: As recommended by your primary care doctor. Keep all scheduled follow-up appointments as recommended.  Comments: Patient is instructed prior to discharge to: Take all medications as prescribed by his/her mental healthcare provider. Report any adverse effects and or reactions from the medicines to his/her outpatient provider promptly. Patient has been instructed & cautioned: To not engage in alcohol and or illegal drug use while on prescription medicines. In the event of worsening symptoms, patient is instructed to call the crisis hotline, 911 and or go to the nearest ED for appropriate evaluation and treatment of symptoms. To follow-up with his/her primary care provider for your other medical issues, concerns and or health care needs.  Signed: Armandina Stammer, NP, PMHNP, FNP-BC 01/07/2018, 11:58 AM    Patient seen, Suicide Assessment Completed.  Disposition Plan Reviewed

## 2018-01-07 NOTE — Progress Notes (Signed)
Patient ID: Sarah MurphyLaura M Aina, female   DOB: [birth year], [age] y.o.   MRN: [id number]   DAR Note: Pt observed laughing inappropriately while whispering to self; she asked "how long as that been going on?" she answered, "I don't know." Pt continue to pace up and down the hallway. Pt continually complained of having one terminal illness or the other-hypochondriac; "I think I have HIV; this spot here looks like cancer." Pt endorsed moderate anxiety and depression. Denied SI, HI or AVH. Pt was med compliant. Pt attended wrap-up group. Will continue to monitor for safety.

## 2018-01-07 NOTE — Progress Notes (Signed)
Recreation Therapy Notes  Date: 3.27.19 Time: 10:00 p.m. Location: [address] Dayroom   Group Topic: Self-Esteem   Goal Area(s) Addresses:  Goal 1.1: To increase self-esteem  - Group will improve mood through participation during Recreation Therapy tx.  - Group will identify at least one positive affirmation by the end of therapy session.   - Group will identify the importance of self-esteem     Intervention: Art   Activity: Patients were to think of a positive affirmation phrase or word to write on their poster. Patents were then given the opportunity to design their poster using the colored pencils and markers provided. Afterwards, Patients shared their positive affirmation to the group and explained how it can help improve their self-esteem.   Education: Self-Esteem   Education Outcome: Acknowledges Education  Clinical Observations/Feedback: Patient did not attend   Chibuikem Thang, Recreation Therapy Intern  Eydan Chianese 01/07/2018 10:54 AM 

## 2018-01-07 NOTE — Plan of Care (Signed)
Patient attended and participated appropriately during Recreation Therapy group treatment successfully engaging in groups with a calm and appropriate mood at least 2x within 5 Recreation Therapy group sessions

## 2018-01-07 NOTE — Progress Notes (Signed)
  [facility]  Will you be returning to the same living situation after discharge:  No. At discharge, do you have transportation home?: Yes,  mother Do you have the ability to pay for your medications: Yes,  mental health  Release of information consent forms completed and in the chart;  Patient's signature needed at discharge.  Patient to Follow up at: Follow-up Information    [facility]. Go on 01/13/2018.   Why:  Appointment is scheduled for Tuesday at 8am. Please bring your ID and hospital discharge paperwork with you! Contact information: [address] [place] [address] [phone]           Next level of care provider [facility] Link:no  Safety Planning and Suicide Prevention discussed: Yes,  yes  Have you used any form of tobacco in the last 30 days? (Cigarettes, Smokeless Tobacco, Cigars, and/or Pipes): Yes  Has patient been referred to the Quitline?: Patient refused referral  Patient has been referred for addiction treatment: Pt. refused referral  Ida RogueRodney B Mehtab Dolberry, LCSW 01/07/2018, 10:38 AM

## 2018-01-07 NOTE — Progress Notes (Signed)
Patient ID: Sarah Mcclure, female   DOB: October 24, 1985, 32 y.o.   MRN: [id number] Patient discharged to home/self care in the presence of family.  Patient denies SI, HI and AVH upon discharge.  Patient had decrease in lability and agitation and was pleasant upon discharge.  Patient acknowledged understanding of discharge instructions and receipt of all personal belongings.

## 2018-01-07 NOTE — Progress Notes (Signed)
Recreation Therapy Notes  INPATIENT RECREATION TR PLAN  Patient Details Name: KEARI MIU MRN: [id number] DOB: 1985/11/05 Today's Date: 01/07/2018  Rec Therapy Plan Is patient appropriate for Therapeutic Recreation?: Yes Treatment times per week: At least three  Estimated Length of Stay: 5-7 days  TR Treatment/Interventions: Group participation (Appropriate participation in Recreation Therapy group tx.)  Discharge Criteria Pt will be discharged from therapy if:: Discharged Treatment plan/goals/alternatives discussed and agreed upon by:: Patient/family  Discharge Summary Short term goals set: See Care Plan  Short term goals met: Complete Progress toward goals comments: Groups attended Which groups?: Social skills, Coping skills, Healthy Support System  Therapeutic equipment acquired: None  Reason patient discharged from therapy: Discharge from hospital Pt/family agrees with progress & goals achieved: Yes Date patient discharged from therapy: 01/07/18  Ranell Patrick, Recreation Therapy Intern   Ranell Patrick 01/07/2018, 12:46 PM

## 2018-02-16 ENCOUNTER — Encounter (HOSPITAL_COMMUNITY): Payer: Self-pay

## 2018-02-16 ENCOUNTER — Emergency Department (HOSPITAL_COMMUNITY)
Admission: EM | Admit: 2018-02-16 | Discharge: 2018-02-17 | Disposition: A | Discharge status: Home / Self Care | Payer: Medicaid Other | Attending: Emergency Medicine | Admitting: Emergency Medicine

## 2018-02-16 ENCOUNTER — Other Ambulatory Visit: Payer: Self-pay

## 2018-02-16 DIAGNOSIS — Z62820 Parent-biological child conflict: Secondary | ICD-10-CM | POA: Diagnosis not present

## 2018-02-16 DIAGNOSIS — J45909 Unspecified asthma, uncomplicated: Secondary | ICD-10-CM | POA: Insufficient documentation

## 2018-02-16 DIAGNOSIS — Z79899 Other long term (current) drug therapy: Secondary | ICD-10-CM | POA: Diagnosis not present

## 2018-02-16 DIAGNOSIS — F17210 Nicotine dependence, cigarettes, uncomplicated: Secondary | ICD-10-CM | POA: Diagnosis not present

## 2018-02-16 DIAGNOSIS — F319 Bipolar disorder, unspecified: Secondary | ICD-10-CM | POA: Diagnosis not present

## 2018-02-16 DIAGNOSIS — Z818 Family history of other mental and behavioral disorders: Secondary | ICD-10-CM | POA: Diagnosis not present

## 2018-02-16 DIAGNOSIS — Z0489 Encounter for examination and observation for other specified reasons: Secondary | ICD-10-CM | POA: Diagnosis present

## 2018-02-16 DIAGNOSIS — R451 Restlessness and agitation: Secondary | ICD-10-CM | POA: Diagnosis not present

## 2018-02-16 HISTORY — DX: Bipolar disorder, unspecified: F31.9

## 2018-02-16 LAB — COMPREHENSIVE METABOLIC PANEL
ALK PHOS: 77 U/L (ref 38–126)
ALT: 18 U/L (ref 14–54)
ANION GAP: 8 (ref 5–15)
AST: 19 U/L (ref 15–41)
Albumin: 4.1 g/dL (ref 3.5–5.0)
BILIRUBIN TOTAL: 0.5 mg/dL (ref 0.3–1.2)
BUN: 14 mg/dL (ref 6–20)
CO2: 24 mmol/L (ref 22–32)
Calcium: 8.8 mg/dL — ABNORMAL LOW (ref 8.9–10.3)
Chloride: 108 mmol/L (ref 101–111)
Creatinine, Ser: 0.76 mg/dL (ref 0.44–1.00)
GFR calc non Af Amer: >60 mL/min (ref >60)
GLUCOSE: 103 mg/dL — AB (ref 65–99)
Potassium: 3.7 mmol/L (ref 3.5–5.1)
Sodium: 140 mmol/L (ref 135–145)
Total Protein: 7 g/dL (ref 6.5–8.1)

## 2018-02-16 LAB — CBC
HCT: 38.8 % (ref 36.0–46.0)
Hemoglobin: 13.1 g/dL (ref 12.0–15.0)
MCH: 30.2 pg (ref 26.0–34.0)
MCHC: 33.8 g/dL (ref 30.0–36.0)
MCV: 89.4 fL (ref 78.0–100.0)
PLATELETS: 300 10*3/uL (ref 150–400)
RBC: 4.34 MIL/uL (ref 3.87–5.11)
RDW: 13.3 % (ref 11.5–15.5)
WBC: 10.5 10*3/uL (ref 4.0–10.5)

## 2018-02-16 LAB — I-STAT BETA HCG BLOOD, ED (MC, WL, AP ONLY)

## 2018-02-16 LAB — ETHANOL: Alcohol, Ethyl (B): <10 mg/dL (ref <10)

## 2018-02-16 MED ORDER — OXCARBAZEPINE 300 MG PO TABS
300.0000 mg | ORAL_TABLET | Freq: Two times a day (BID) | ORAL | Status: DC
  Administered 2018-02-16 – 2018-02-17 (×3): 300 mg via ORAL
  Filled 2018-02-16 (×3): qty 1

## 2018-02-16 MED ORDER — CLINDAMYCIN HCL 300 MG PO CAPS: 300.0000 mg | ORAL_CAPSULE | ORAL | Status: DC

## 2018-02-16 MED ORDER — HYDROXYZINE HCL 25 MG PO TABS
50.0000 mg | ORAL_TABLET | Freq: Four times a day (QID) | ORAL | Status: DC | PRN
  Administered 2018-02-16: 50 mg via ORAL
  Filled 2018-02-16: qty 2

## 2018-02-16 NOTE — ED Notes (Signed)
Pt ambulated to restroom to change out into paper scrubs.

## 2018-02-16 NOTE — ED Notes (Signed)
Bed: [facility] Expected date:  Expected time:  Means of arrival:  Comments: Hold for [employer]

## 2018-02-16 NOTE — ED Triage Notes (Signed)
Pt reports that she does not want to give blood at this time and does not understand why she is here and reports " my mom is power tripping" informed pt to comply with request and she will be evaluated by MD to determine plan of care.Pt verbalized understanding

## 2018-02-16 NOTE — BH Assessment (Signed)
[facility] Assessment Progress Note  Case was staffed with Parks FNP who recommended patient be monitored and observed for safety. Patient will be seen by psychiatry in the a.m.      

## 2018-02-16 NOTE — ED Provider Notes (Signed)
[facility]-EMERGENCY DEPT Provider Note   CSN: [id number] Arrival date & time: 02/16/18  1100     History   Chief Complaint Chief Complaint  Patient presents with  . Medical Clearance    HPI Sarah Mcclure is a 32 y.o. female.  HPI   32 year old female with history of bipolar disorder and polysubstance abuse here under IVC from her mother.  Patient states that her mother is simply exerting control over her.  She states that she got into an argument with her mother, which is very common.  The patient states she has been trying to stay off drugs and has been taking her medications and going to support classes.  She states her mother and her got into an argument yesterday.  They got into an argument again this morning and she states that her mother took down an IVC to get back at her.  Per the IVC, the patient reportedly was aggressive to her mother, the mother is not currently present.  There is no mention of SI or HI.  On my assessment, patient denies any suicidal homicidal ideation.  She does not appear intoxicated.  She states that she is enjoying going to classes and demonstrates good goal oriented behavior of goal of getting out of the house.  She lives with her mother and uncle.  Past Medical History:  Diagnosis Date  . Allergy   . Asthma    As a child  . Bipolar 1 disorder (HCC)   . Chronic back pain greater than 3 months duration 2013  . Depression   . Fibromyalgia     Patient Active Problem List   Diagnosis Date Noted  . Bipolar affective disorder, manic, severe, with psychotic behavior (HCC) 01/01/2018  . Bipolar affective disorder, currently manic, moderate (HCC) 12/31/2017  . Psychosis (HCC)   . Bipolar affective disorder, mixed, severe (HCC) 12/27/2017  . Bipolar I disorder, current or most recent episode depressed, with psychotic features (HCC) 12/16/2017  . Polysubstance (including opioids) dependence, daily use (HCC) 12/15/2017  . Breast  lump on left side at 6 o'clock position 06/29/2013  . Breast lump on right side at 3 o'clock position 06/29/2013  . Cat allergies 05/25/2012  . Family history of systemic lupus erythematosus 04/13/2012  . Back pain 04/13/2012  . Pleuritic chest pain 04/13/2012  . Shin splints 04/13/2012    No past surgical history on file.   OB History    Gravida  5   Para  2   Term  2   Preterm      AB  3   Living  2     SAB  2   TAB  1   Ectopic      Multiple      Live Births               Home Medications    Prior to Admission medications   Medication Sig Start Date End Date Taking? Authorizing Provider  ARIPiprazole ER (ABILIFY MAINTENA) 400 MG SRER injection Inject 2 mLs (400 mg total) into the muscle every 28 (twenty-eight) days. (Due on 02-03-18): For mood control 02/03/18  Yes Nwoko, Nicole Kindred I, NP  clindamycin (CLEOCIN) 300 MG capsule Take 300 mg by mouth. Every other morning 01/22/18  Yes [provider]  gabapentin (NEURONTIN) 100 MG capsule Take 2 capsules (200 mg total) by mouth 3 (three) times daily. For agitation 01/07/18  Yes Armandina Stammer I, NP  hydrOXYzine (ATARAX/VISTARIL) 50 MG  tablet Take 1 tablet (50 mg total) by mouth every 6 (six) hours as needed for anxiety. 01/07/18  Yes Armandina Stammer I, NP  Multiple Vitamin (MULTIVITAMIN WITH MINERALS) TABS tablet Take 1 tablet by mouth daily.   Yes [provider]  Oxcarbazepine (TRILEPTAL) 300 MG tablet Take 1 tablet (300 mg total) by mouth 2 (two) times daily. For mood stabilization 01/07/18  Yes Nwoko, Nicole Kindred I, NP  ARIPiprazole (ABILIFY) 20 MG tablet Take 1 tablet (20 mg total) by mouth daily. For mood control Patient not taking: Reported on 02/16/2018 01/08/18   Armandina Stammer I, NP  hydrocortisone cream 1 % Apply topically 2 (two) times daily. For itching Patient not taking: Reported on 02/16/2018 01/07/18   Armandina Stammer I, NP  mupirocin cream (BACTROBAN) 2 % Apply topically 2 (two) times daily. For wound  care Patient not taking: Reported on 02/16/2018 01/07/18   Armandina Stammer I, NP  nicotine polacrilex (NICORETTE) 2 MG gum Take 1 each (2 mg total) by mouth as needed for smoking cessation. (May purchase from over the counter): For smoking cessation Patient not taking: Reported on 02/16/2018 01/07/18   Armandina Stammer I, NP  sulfamethoxazole-trimethoprim (BACTRIM DS,SEPTRA DS) 800-160 MG tablet Take 1 tablet by mouth every 12 (twelve) hours. For urinary tract infection Patient not taking: Reported on 02/16/2018 01/07/18   Armandina Stammer I, NP  traZODone (DESYREL) 100 MG tablet Take 1 tablet (100 mg total) by mouth at bedtime. For sleep Patient not taking: Reported on 02/16/2018 01/07/18   Sanjuana Kava, NP    Family History Family History  Problem Relation Age of Onset  . Alcohol abuse Mother   . Arthritis Mother   . Lupus Father   . Alcohol abuse Father   . Heart disease Father   . Depression Father   . Heart disease Maternal Grandfather     Social History Social History   Tobacco Use  . Smoking status: Current Some Day Smoker    Types: Cigarettes  . Smokeless tobacco: Never Used  Substance Use Topics  . Alcohol use: No  . Drug use: Yes    Types: Marijuana, Other-see comments    Comment: molly     Allergies   Amoxicillin and Penicillins   Review of Systems Review of Systems  Constitutional: Negative for chills and fever.  HENT: Negative for congestion, rhinorrhea and sore throat.   Eyes: Negative for visual disturbance.  Respiratory: Negative for cough, shortness of breath and wheezing.   Cardiovascular: Negative for chest pain and leg swelling.  Gastrointestinal: Negative for abdominal pain, diarrhea, nausea and vomiting.  Genitourinary: Negative for dysuria, flank pain, vaginal bleeding and vaginal discharge.  Musculoskeletal: Negative for neck pain.  Skin: Negative for rash.  Allergic/Immunologic: Negative for immunocompromised state.  Neurological: Negative for syncope and  headaches.  Hematological: Does not bruise/bleed easily.  All other systems reviewed and are negative.    Physical Exam Updated Vital Signs BP 115/82 (BP Location: Left Arm)   Pulse 86   Temp 98.7 F (37.1 C) (Oral)   Resp 18   SpO2 100%   Physical Exam  Constitutional: She is oriented to person, place, and time. She appears well-developed and well-nourished. No distress.  HENT:  Head: Normocephalic and atraumatic.  Eyes: Conjunctivae are normal.  Neck: Neck supple.  Cardiovascular: Normal rate, regular rhythm and normal heart sounds. Exam reveals no friction rub.  No murmur heard. Pulmonary/Chest: Effort normal and breath sounds normal. No respiratory distress. She has no wheezes.  She has no rales.  Abdominal: She exhibits no distension.  Musculoskeletal: She exhibits no edema.  Neurological: She is alert and oriented to person, place, and time. She exhibits normal muscle tone.  Skin: Skin is warm. Capillary refill takes less than 2 seconds.  Psychiatric: She has a normal mood and affect.  Nursing note and vitals reviewed.    ED Treatments / Results  Labs (all labs ordered are listed, but only abnormal results are displayed) Labs Reviewed  COMPREHENSIVE METABOLIC PANEL - Abnormal; Notable for the following components:      Result Value   Glucose, Bld 103 (*)    Calcium 8.8 (*)    All other components within normal limits  ETHANOL  CBC  RAPID URINE DRUG SCREEN, HOSP PERFORMED  I-STAT BETA HCG BLOOD, ED ([facility], [facility], [facility] ONLY)    EKG None  Radiology No results found.  Procedures Procedures (including critical care time)  Medications Ordered in ED Medications - No data to display   Initial Impression / Assessment and Plan / ED Course  I have reviewed the triage vital signs and the nursing notes.  Pertinent labs & imaging results that were available during my care of the patient were reviewed by me and considered in my medical decision making (see chart for  details).     32 year old female here under IVC by her mother.  Unclear what led to her being IVC.  On my assessment, the patient is calm and cooperative.  She denies any HI, SI, or auditory or visual hallucinations.  Will consult TTS given mother's concerns, pt's history, and IVC.  Patient is otherwise hemodynamically stable and medically clear.  Final Clinical Impressions(s) / ED Diagnoses   Final diagnoses:  Bipolar affective disorder, remission status unspecified [facility])    ED Discharge Orders    None       Shaune Pollack, MD 02/16/18 1432

## 2018-02-16 NOTE — ED Notes (Signed)
Pt stating that she is wanting to sign out AMA. Informed patient that due to her having IVC papers out on her she isnt able to leave AMA.

## 2018-02-16 NOTE — Patient Outreach (Signed)
CPSS met with the patient and provided substance use recovery support. CPSS utilized motivational interviewing skills to highlight the patient strengths and to highlight her progress with her substance use recovery. CPSS talked to the patient about multiple forms of recovery with harm reduction and how there is no shame in a harm reduction recovery path. Patient's plan is to continue going to the [place] support group meetings that she has been going to on a weekly basis. Patient feels she does not need any residential/outpatient substance use treatment. CPSS will provide CPSS contact information. CPSS strongly encouraged the patient to continue to stay in contact with CPSS for substance use recovery support.

## 2018-02-16 NOTE — BH Assessment (Addendum)
Assessment Note  Sarah Mcclure is an 32 y.o. female that presents this date with IVC. Per IVC: "Respondent has been diagnosed with Bipolar Disorder and is currently on medications. Respondent is currently compliant with medications and has been committed before (December 10 2017) and has been suicidal in the past but is not currently. Respondent is aggressive to mother and makes threats about harming her. Respondent is drinking alcohol and may be doing drugs." Patient is observed to be somewhat agitated and denies any S/I, H/I or AVH. Patient denies the content of the IVC. Patient reports ongoing conflict with her mother with whom she currently resides. Patient states this date that they were arguing over "who was going to use the car" and the patient admitted to making threats towards the mother although she stated she "did not want to harm her." The patient stated "she always does this to me when we argue" in reference to the IVC. Patient reports that they are currently receiving services from [facility] MD who assist with medication management. Patient states she last saw that provider one month ago and reports current medication compliance. Patient reports she was diagnosed with Bipolar Depression "many years ago" but feels her medications are working as indicated. Patient states she only gets depressed when her mother "makes her feel worthless." Patient states she is also involved with therapy at two different providers [facility] and [facility]. Patient reports she has been attending classes at [facility] to assist with ongoing SA issues. Patient reports she uses Cannabis (two or three times a week with last use on 02/15/18 when she used 1 gram) and alcohol (amounts vary) with last use "sometime last week" when she had "a couple beers." Patient denies any other illicit SA use but reports a history of polysubstance use in the past. Patient denies any S/I, H/I or AVH this date. Patient admits that she made threats to her  mother earlier this date but they were not homicidal in content. Per record review patient was last seen at [facility] on 12/26/17 with IVC for being suicidal. Per chart review patient has had 6 previous attempts at self harm. Patient does not appear to be responding to internal stimuli and is time/place oriented. Patient is observed to be agitated but states it is "because of her mother." Patient speaks in a loud voice that is elevated at times but can be redirected as she provides history. Patient's UDS is pending. Case was staffed with Arville Care FNP who recommended patient be monitored and observed for safety. Patient will be seen by psychiatry in the a.m.     Diagnosis: F31.4 Bipolar affective disorder, current episode severe depression without psychotic symptoms.  Past Medical History:  Past Medical History:  Diagnosis Date  . Allergy   . Asthma    As a child  . Bipolar 1 disorder (HCC)   . Chronic back pain greater than 3 months duration 2013  . Depression   . Fibromyalgia     No past surgical history on file.  Family History:  Family History  Problem Relation Age of Onset  . Alcohol abuse Mother   . Arthritis Mother   . Lupus Father   . Alcohol abuse Father   . Heart disease Father   . Depression Father   . Heart disease Maternal Grandfather     Social History:  reports that she has been smoking cigarettes.  She has never used smokeless tobacco. She reports that she has current or past drug history. Drugs: Marijuana  and Other-see comments. She reports that she does not drink alcohol.  Additional Social History:  Alcohol / Drug Use Pain Medications: See MAR Prescriptions: See MAR Over the Counter: See MAR History of alcohol / drug use?: Yes Negative Consequences of Use: (Denies) Withdrawal Symptoms: (Denies) Substance #1 Name of Substance 1: Alcohol 1 - Age of First Use: 22 1 - Amount (size/oz): Pt reports "a few beers"    1 - Frequency: Pt reports "usually weekends" 1 -  Duration: Last ten years 1 - Last Use / Amount: Pt states "sometimes last week" amount unknown Substance #2 Name of Substance 2: Cannabis 2 - Age of First Use: 22 2 - Amount (size/oz): 1 to 2 grams 2 - Frequency: Three to four times a week 2 - Duration: Last "few years"  2 - Last Use / Amount: 02/15/18 1 gram  CIWA: CIWA-Ar BP: 115/82 Pulse Rate: 86 COWS:    Allergies:  Allergies  Allergen Reactions  . Amoxicillin Other (See Comments)    Has patient had a PCN reaction causing immediate rash, facial/tongue/throat swelling, SOB or lightheadedness with hypotension: Unknown Has patient had a PCN reaction causing severe rash involving mucus membranes or skin necrosis: No Has patient had a PCN reaction that required hospitalization: Unknown Has patient had a PCN reaction occurring within the last 10 years: No If all of the above answers are "NO", then may proceed with Cephalosporin use.   Marland Kitchen Penicillins Other (See Comments)    Has patient had a PCN reaction causing immediate rash, facial/tongue/throat swelling, SOB or lightheadedness with hypotension: Unknown Has patient had a PCN reaction causing severe rash involving mucus membranes or skin necrosis: No Has patient had a PCN reaction that required hospitalization: Unknown Has patient had a PCN reaction occurring within the last 10 years: No If all of the above answers are "NO", then may proceed with Cephalosporin use.     Home Medications:  (Not in a hospital admission)  OB/GYN Status:  No LMP recorded.  General Assessment Data [facility]: [facility] TTS Assessment: In system Is this a Tele or Face-to-Face Assessment?: Face-to-Face Is this an Initial Assessment or a Re-assessment for this encounter?: Initial Assessment Marital status: Single Maiden name: NA Is patient pregnant?: No Pregnancy Status: No Living Arrangements: Parent, Other relatives Can pt return to current living arrangement?: Yes Admission Status:  Involuntary Is patient capable of signing voluntary admission?: Yes Referral Source: Self/Family/Friend Insurance type: Medicaid  Medical Screening Exam [facility] Walk-in ONLY) Medical Exam completed: Yes  Crisis Care Plan Living Arrangements: Parent, Other relatives Legal Guardian: (NA) Name of Psychiatrist: [facility] MD Name of Therapist: [facility]  Education Status Is patient currently in school?: No Is the patient employed, unemployed or receiving disability?: Unemployed  Risk to self with the past 6 months Suicidal Ideation: No Has patient been a risk to self within the past 6 months prior to admission? : Yes(Per notes 12/26/17) Suicidal Intent: No Has patient had any suicidal intent within the past 6 months prior to admission? : Yes Is patient at risk for suicide?: Yes Suicidal Plan?: No Has patient had any suicidal plan within the past 6 months prior to admission? : Yes Access to Means: No What has been your use of drugs/alcohol within the last 12 months?: Current use Previous Attempts/Gestures: Yes How many times?: 6(Per notes) Other Self Harm Risks: NA Triggers for Past Attempts: Family contact Intentional Self Injurious Behavior: Cutting Comment - Self Injurious Behavior: Held razor  to skin in the past Family Suicide History: No Recent stressful life event(s): Other (Comment)(Ongoing SA use) Persecutory voices/beliefs?: No Depression: Yes Depression Symptoms: Feeling worthless/self pity Substance abuse history and/or treatment for substance abuse?: Yes Suicide prevention information given to non-admitted patients: Not applicable  Risk to Others within the past 6 months Homicidal Ideation: No Does patient have any lifetime risk of violence toward others beyond the six months prior to admission? : No Thoughts of Harm to Others: Yes-Currently Present Comment - Thoughts of Harm to Others: Pt states she doesn't want to kill but harm Current  Homicidal Intent: No Current Homicidal Plan: No Access to Homicidal Means: No Identified Victim: NA History of harm to others?: Yes Assessment of Violence: In distant past Violent Behavior Description: Threats to family Does patient have access to weapons?: No Criminal Charges Pending?: No Does patient have a court date: No Is patient on probation?: No  Psychosis Hallucinations: None noted Delusions: None noted  Mental Status Report Appearance/Hygiene: In scrubs Eye Contact: Fair Motor Activity: Freedom of movement Speech: Logical/coherent Level of Consciousness: Irritable Mood: Anxious Affect: Irritable Anxiety Level: Moderate Thought Processes: Coherent, Relevant Judgement: Unimpaired Orientation: Person, Place, Time Obsessive Compulsive Thoughts/Behaviors: None  Cognitive Functioning Concentration: Normal Memory: Recent Intact, Remote Intact Is patient IDD: No Is patient DD?: No Insight: Fair Impulse Control: Fair Appetite: Good Have you had any weight changes? : No Change Sleep: No Change Total Hours of Sleep: 7 Vegetative Symptoms: None  ADLScreening [facility] Assessment Services) Patient's cognitive ability adequate to safely complete daily activities?: Yes Patient able to express need for assistance with ADLs?: Yes Independently performs ADLs?: Yes (appropriate for developmental age)  Prior Inpatient Therapy Prior Inpatient Therapy: Yes Prior Therapy Dates: 2019 Prior Therapy Facilty/Provider(s): [facility] Reason for Treatment: MH issues  Prior Outpatient Therapy Prior Outpatient Therapy: Yes Prior Therapy Dates: 2019 Prior Therapy Facilty/Provider(s): [facility]  Reason for Treatment: MH issues Does patient have an ACCT team?: No Does patient have Intensive In-House Services?  : No Does patient have [facility] services? : Yes Does patient have P4CC services?: No  ADL Screening (condition at time of admission) Patient's cognitive ability adequate to safely  complete daily activities?: Yes Is the patient deaf or have difficulty hearing?: No Does the patient have difficulty seeing, even when wearing glasses/contacts?: No Does the patient have difficulty concentrating, remembering, or making decisions?: No Patient able to express need for assistance with ADLs?: Yes Does the patient have difficulty dressing or bathing?: No Independently performs ADLs?: Yes (appropriate for developmental age) Does the patient have difficulty walking or climbing stairs?: No Weakness of Legs: None Weakness of Arms/Hands: None  Home Assistive Devices/Equipment Home Assistive Devices/Equipment: None  Therapy Consults (therapy consults require a physician order) PT Evaluation Needed: No OT Evalulation Needed: No SLP Evaluation Needed: No Abuse/Neglect Assessment (Assessment to be complete while patient is alone) Physical Abuse: Denies Verbal Abuse: Denies Sexual Abuse: Denies Exploitation of patient/patient's resources: Denies Self-Neglect: Denies Values / Beliefs Cultural Requests During Hospitalization: None Spiritual Requests During Hospitalization: None Consults Spiritual Care Consult Needed: No Social Work Consult Needed: No Merchant navy officer (For Healthcare) Does Patient Have a Medical Advance Directive?: No Would patient like information on creating a medical advance directive?: No - Patient declined    Additional Information 1:1 In Past 12 Months?: No CIRT Risk: No Elopement Risk: No Does patient have medical clearance?: Yes     Disposition: Case was staffed with Arville Care FNP who recommended patient be monitored and observed for  safety. Patient will be seen by psychiatry in the a.m.     Disposition Initial Assessment Completed for this Encounter: Yes Disposition of Patient: (Observe and monitor) Patient refused recommended treatment: Yes Mode of transportation if patient is discharged?: (Unknown)  On Site Evaluation by:   Reviewed with  Physician:    Alfredia Ferguson 02/16/2018 1:24 PM

## 2018-02-16 NOTE — ED Triage Notes (Signed)
Pt arrived via [facility]. Pt is IVC'd by her mother. Per pt mother pt was making threats to her mother with intent to harm her;  per Pt mother she has been taking Ethol and reports drug use. Pt denies SI.

## 2018-02-16 NOTE — ED Notes (Signed)
Pt Belongings: Clothing-(pants, shirt, scarf), necklace-multicolored (1), Silver colored rings (3)

## 2018-02-17 ENCOUNTER — Encounter (HOSPITAL_COMMUNITY): Payer: Self-pay | Admitting: Psychiatry

## 2018-02-17 DIAGNOSIS — F17210 Nicotine dependence, cigarettes, uncomplicated: Secondary | ICD-10-CM

## 2018-02-17 DIAGNOSIS — F419 Anxiety disorder, unspecified: Secondary | ICD-10-CM

## 2018-02-17 DIAGNOSIS — Z62820 Parent-biological child conflict: Secondary | ICD-10-CM

## 2018-02-17 DIAGNOSIS — R451 Restlessness and agitation: Secondary | ICD-10-CM

## 2018-02-17 DIAGNOSIS — Z811 Family history of alcohol abuse and dependence: Secondary | ICD-10-CM

## 2018-02-17 DIAGNOSIS — F1290 Cannabis use, unspecified, uncomplicated: Secondary | ICD-10-CM

## 2018-02-17 DIAGNOSIS — R450 Nervousness: Secondary | ICD-10-CM | POA: Diagnosis not present

## 2018-02-17 DIAGNOSIS — F319 Bipolar disorder, unspecified: Secondary | ICD-10-CM

## 2018-02-17 DIAGNOSIS — Z818 Family history of other mental and behavioral disorders: Secondary | ICD-10-CM | POA: Diagnosis not present

## 2018-02-17 NOTE — [facility] Suicide Risk Assessment (Signed)
Suicide Risk Assessment  Discharge Assessment   [facility] Discharge Suicide Risk Assessment   Principal Problem: Bipolar affective disorder [facility]) Discharge Diagnoses:  Patient Active Problem List   Diagnosis Date Noted  . Bipolar affective disorder, manic, severe, with psychotic behavior (HCC) [F31.2] 01/01/2018  . Bipolar affective disorder, currently manic, moderate (HCC) [F31.12] 12/31/2017  . Psychosis (HCC) [F29]   . Bipolar affective disorder, mixed, severe (HCC) [F31.63] 12/27/2017  . Bipolar affective disorder (HCC) [F31.9] 12/16/2017  . Polysubstance (including opioids) dependence, daily use (HCC) [F19.20] 12/15/2017  . Breast lump on left side at 6 o'clock position [N63.20] 06/29/2013  . Breast lump on right side at 3 o'clock position [N63.10] 06/29/2013  . Cat allergies [J30.81] 05/25/2012  . Family history of systemic lupus erythematosus [Z82.69] 04/13/2012  . Back pain [M54.9] 04/13/2012  . Pleuritic chest pain [R07.81] 04/13/2012  . Shin splints [F62.130Q] 04/13/2012    Total Time spent with patient: 45 minutes  Musculoskeletal: Strength & Muscle Tone: within normal limits Gait & Station: normal Patient leans: N/A  Psychiatric Specialty Exam:   Blood pressure 110/70, pulse 74, temperature 98 F (36.7 C), resp. rate 18, SpO2 99 %.There is no height or weight on file to calculate BMI.  General Appearance: Casual  Eye Contact::  Good  Speech:  Clear and Coherent and Normal Rate409  Volume:  Normal  Mood:  Anxious and Depressed  Affect:  Congruent and Depressed  Thought Process:  Coherent, Goal Directed and Linear  Orientation:  Full (Time, Place, and Person)  Thought Content:  Logical  Suicidal Thoughts:  No  Homicidal Thoughts:  No  Memory:  Immediate;   Good Recent;   Good Remote;   Fair  Judgement:  Fair  Insight:  Fair  Psychomotor Activity:  Normal  Concentration:  Good  Recall:  Good  Fund of Knowledge:Good  Language: Good  Akathisia:  No  Handed:   Right  AIMS (if indicated):     Assets:  Architect Housing  Sleep:     Cognition: WNL  ADL's:  Intact   Mental Status Per Nursing Assessment::   On Admission:   Agitated  Demographic Factors:  Caucasian  Loss Factors: Financial problems/change in socioeconomic status  Historical Factors: Impulsivity  Risk Reduction Factors:   Sense of responsibility to family and Living with another person, especially a relative  Continued Clinical Symptoms:  Bipolar Disorder:   Mixed State Depression:   Impulsivity Alcohol/Substance Abuse/Dependencies  Cognitive Features That Contribute To Risk:  Closed-mindedness    Suicide Risk:  Minimal: No identifiable suicidal ideation.  Patients presenting with no risk factors but with morbid ruminations; may be classified as minimal risk based on the severity of the depressive symptoms    Plan Of Care/Follow-up recommendations:  Activity:  as tolerated Diet:  Heart Healthy  Laveda Abbe, NP 02/17/2018, 10:11 AM

## 2018-02-17 NOTE — Consult Note (Addendum)
[facility] Consult   Reason for Consult:  Agitation Referring Physician:  EDP Patient Identification: Sarah Mcclure MRN:  [id number] Principal Diagnosis: Bipolar affective disorder [facility]) Diagnosis:   Patient Active Problem List   Diagnosis Date Noted  . Bipolar affective disorder, manic, severe, with psychotic behavior ([place]) [F31.2] 01/01/2018  . Bipolar affective disorder, currently manic, moderate ([place]) [F31.12] 12/31/2017  . Psychosis ([place]) [F29]   . Bipolar affective disorder, mixed, severe ([place]) [F31.63] 12/27/2017  . Bipolar affective disorder ([place]) [F31.9] 12/16/2017  . Polysubstance (including opioids) dependence, daily use ([place]) [F19.20] 12/15/2017  . Breast lump on left side at 6 o'clock position [N63.20] 06/29/2013  . Breast lump on right side at 3 o'clock position [N63.10] 06/29/2013  . Cat allergies [J30.81] 05/25/2012  . Family history of systemic lupus erythematosus [Z82.69] 04/13/2012  . Back pain [M54.9] 04/13/2012  . Pleuritic chest pain [R07.81] 04/13/2012  . Shin splints [W09.811B] 04/13/2012    Total Time spent with patient: 45 minutes  Subjective:   Sarah Mcclure is a 32 y.o. female patient admitted with agitation after arguing with her mother.  HPI:  Pt was seen and chart reviewed with treatment team and Dr Mariea Clonts. Pt denies suicidal/homicidal ideation, denies auditory/visual hallucinations and does not appear to be responding to internal stimuli. Pt stated her mother keeps IVC'ing her every time they have a disagreement. Yesterday they were arguing about a car and her mother placed her under IVC. Pt stated she was living at her mother's but is going to live with the father of her children when she is discharged. Pt stated she drank some alcohol over the weekend and because her mother is a recovering alcoholic she saw that as the Pt spiraling out of control. Pt stated her mother tries to force her to get help by placing her under IVC. Pt is  stable and psychiatrically clear for discharge.   Past Psychiatric History: As above  Risk to Self: None Risk to Others: None Prior Inpatient Therapy: Prior Inpatient Therapy: Yes Prior Therapy Dates: 2019 Prior Therapy Facilty/Provider(s): [facility] Reason for Treatment: MH issues Prior Outpatient Therapy: Prior Outpatient Therapy: Yes Prior Therapy Dates: 2019 Prior Therapy Facilty/Provider(s): [facility]  Reason for Treatment: MH issues Does patient have an ACCT team?: No Does patient have Intensive In-House Services?  : No Does patient have [facility] services? : Yes Does patient have P4CC services?: No  Past Medical History:  Past Medical History:  Diagnosis Date  . Allergy   . Asthma    As a child  . Bipolar 1 disorder ([place])   . Chronic back pain greater than 3 months duration 2013  . Depression   . Fibromyalgia    History reviewed. No pertinent surgical history. Family History:  Family History  Problem Relation Age of Onset  . Alcohol abuse Mother   . Arthritis Mother   . Lupus Father   . Alcohol abuse Father   . Heart disease Father   . Depression Father   . Heart disease Maternal Grandfather    Family Psychiatric  History: As listed above.  Social History:  Social History   Substance and Sexual Activity  Alcohol Use No     Social History   Substance and Sexual Activity  Drug Use Yes  . Types: Marijuana, Other-see comments   Comment: molly    Social History   Socioeconomic History  . Marital status: Single    Spouse name: Not on file  . Number of children:  Not on file  . Years of education: Not on file  . Highest education level: Not on file  Occupational History  . Not on file  Social Needs  . Financial resource strain: Not on file  . Food insecurity:    Worry: Not on file    Inability: Not on file  . Transportation needs:    Medical: Not on file    Non-medical: Not on file  Tobacco Use  . Smoking status: Current Some Day Smoker    Types:  Cigarettes  . Smokeless tobacco: Never Used  Substance and Sexual Activity  . Alcohol use: No  . Drug use: Yes    Types: Marijuana, Other-see comments    Comment: molly  . Sexual activity: Yes    Birth control/protection: IUD  Lifestyle  . Physical activity:    Days per week: Not on file    Minutes per session: Not on file  . Stress: Not on file  Relationships  . Social connections:    Talks on phone: Not on file    Gets together: Not on file    Attends religious service: Not on file    Active member of club or organization: Not on file    Attends meetings of clubs or organizations: Not on file    Relationship status: Not on file  Other Topics Concern  . Not on file  Social History Narrative  . Not on file   Additional Social History: N/A    Allergies:   Allergies  Allergen Reactions  . Amoxicillin Other (See Comments)    Has patient had a PCN reaction causing immediate rash, facial/tongue/throat swelling, SOB or lightheadedness with hypotension: Unknown Has patient had a PCN reaction causing severe rash involving mucus membranes or skin necrosis: No Has patient had a PCN reaction that required hospitalization: Unknown Has patient had a PCN reaction occurring within the last 10 years: No If all of the above answers are "NO", then may proceed with Cephalosporin use.   Marland Kitchen Penicillins Other (See Comments)    Has patient had a PCN reaction causing immediate rash, facial/tongue/throat swelling, SOB or lightheadedness with hypotension: Unknown Has patient had a PCN reaction causing severe rash involving mucus membranes or skin necrosis: No Has patient had a PCN reaction that required hospitalization: Unknown Has patient had a PCN reaction occurring within the last 10 years: No If all of the above answers are "NO", then may proceed with Cephalosporin use.     Labs:  Results for orders placed or performed during the hospital encounter of 02/16/18 (from the past 48 hour(s))   Comprehensive metabolic panel     Status: Abnormal   Collection Time: 02/16/18 11:19 AM  Result Value Ref Range   Sodium 140 135 - 145 mmol/L   Potassium 3.7 3.5 - 5.1 mmol/L   Chloride 108 101 - 111 mmol/L   CO2 24 22 - 32 mmol/L   Glucose, Bld 103 (H) 65 - 99 mg/dL   BUN 14 6 - 20 mg/dL   Creatinine, Ser 0.76 0.44 - 1.00 mg/dL   Calcium 8.8 (L) 8.9 - 10.3 mg/dL   Total Protein 7.0 6.5 - 8.1 g/dL   Albumin 4.1 3.5 - 5.0 g/dL   AST 19 15 - 41 U/L   ALT 18 14 - 54 U/L   Alkaline Phosphatase 77 38 - 126 U/L   Total Bilirubin 0.5 0.3 - 1.2 mg/dL   GFR calc non Af Amer >60 >60 mL/min   GFR  calc Af Amer >60 >60 mL/min    Comment: (NOTE) The eGFR has been calculated using the CKD EPI equation. This calculation has not been validated in all clinical situations. eGFR's persistently <60 mL/min signify possible Chronic Kidney Disease.    Anion gap 8 5 - 15    Comment: Performed at [facility], [place] [address], [place], [place] [address]  Ethanol     Status: None   Collection Time: 02/16/18 11:19 AM  Result Value Ref Range   Alcohol, Ethyl (B) <10 <10 mg/dL    Comment:        LOWEST DETECTABLE LIMIT FOR SERUM ALCOHOL IS 10 mg/dL FOR MEDICAL PURPOSES ONLY Performed at [facility] [address] [place], [place] [address]   cbc     Status: None   Collection Time: 02/16/18 11:19 AM  Result Value Ref Range   WBC 10.5 4.0 - 10.5 K/uL   RBC 4.34 3.87 - 5.11 MIL/uL   Hemoglobin 13.1 12.0 - 15.0 g/dL   HCT 38.8 36.0 - 46.0 %   MCV 89.4 78.0 - 100.0 fL   MCH 30.2 26.0 - 34.0 pg   MCHC 33.8 30.0 - 36.0 g/dL   RDW 13.3 11.5 - 15.5 %   Platelets 300 150 - 400 K/uL    Comment: Performed at [facility], [place] [address] [place], [place] [address]  I-Stat beta hCG blood, ED     Status: None   Collection Time: 02/16/18 11:50 AM  Result Value Ref Range   I-stat hCG, quantitative <5.0 <5 mIU/mL   Comment 3            Comment:    GEST. AGE      CONC.  (mIU/mL)   <=1 WEEK        5 - 50     2 WEEKS       50 - 500     3 WEEKS       100 - 10,000     4 WEEKS     1,000 - 30,000        FEMALE AND NON-PREGNANT FEMALE:     LESS THAN 5 mIU/mL     Current Facility-Administered Medications  Medication Dose Route Frequency Provider Last Rate Last Dose  . [START ON 02/18/2018] clindamycin (CLEOCIN) capsule 300 mg  300 mg Oral Gwenyth Allegra, MD      . hydrOXYzine (ATARAX/VISTARIL) tablet 50 mg  50 mg Oral Q6H PRN Duffy Bruce, MD   50 mg at 02/16/18 2123  . Oxcarbazepine (TRILEPTAL) tablet 300 mg  300 mg Oral BID Duffy Bruce, MD   300 mg at 02/16/18 2123   Current Outpatient Medications  Medication Sig Dispense Refill  . ARIPiprazole ER (ABILIFY MAINTENA) 400 MG SRER injection Inject 2 mLs (400 mg total) into the muscle every 28 (twenty-eight) days. (Due on 02-03-18): For mood control 1 each 0  . clindamycin (CLEOCIN) 300 MG capsule Take 300 mg by mouth. Every other morning  0  . gabapentin (NEURONTIN) 100 MG capsule Take 2 capsules (200 mg total) by mouth 3 (three) times daily. For agitation 180 capsule 0  . hydrOXYzine (ATARAX/VISTARIL) 50 MG tablet Take 1 tablet (50 mg total) by mouth every 6 (six) hours as needed for anxiety. 75 tablet 0  . Multiple Vitamin (MULTIVITAMIN WITH MINERALS) TABS tablet Take 1 tablet by mouth daily.    . Oxcarbazepine (TRILEPTAL) 300 MG tablet Take 1 tablet (300 mg  total) by mouth 2 (two) times daily. For mood stabilization 60 tablet 0  . ARIPiprazole (ABILIFY) 20 MG tablet Take 1 tablet (20 mg total) by mouth daily. For mood control (Patient not taking: Reported on 02/16/2018) 30 tablet 0  . hydrocortisone cream 1 % Apply topically 2 (two) times daily. For itching (Patient not taking: Reported on 02/16/2018) 30 g 0  . mupirocin cream (BACTROBAN) 2 % Apply topically 2 (two) times daily. For wound care (Patient not taking: Reported on 02/16/2018) 15 g 0  . nicotine polacrilex (NICORETTE) 2 MG  gum Take 1 each (2 mg total) by mouth as needed for smoking cessation. (May purchase from over the counter): For smoking cessation (Patient not taking: Reported on 02/16/2018) 100 tablet 0  . sulfamethoxazole-trimethoprim (BACTRIM DS,SEPTRA DS) 800-160 MG tablet Take 1 tablet by mouth every 12 (twelve) hours. For urinary tract infection (Patient not taking: Reported on 02/16/2018)    . traZODone (DESYREL) 100 MG tablet Take 1 tablet (100 mg total) by mouth at bedtime. For sleep (Patient not taking: Reported on 02/16/2018) 30 tablet 0    Musculoskeletal: Strength & Muscle Tone: within normal limits Gait & Station: normal Patient leans: N/A  Psychiatric Specialty Exam: Physical Exam  Nursing note and vitals reviewed. Constitutional: She is oriented to person, place, and time. She appears well-developed and well-nourished.  HENT:  Head: Normocephalic and atraumatic.  Neck: Normal range of motion.  Respiratory: Effort normal.  Musculoskeletal: Normal range of motion.  Neurological: She is alert and oriented to person, place, and time.  Psychiatric: Her speech is normal and behavior is normal. Thought content normal. Her mood appears anxious. Cognition and memory are normal. She expresses impulsivity.    Review of Systems  Psychiatric/Behavioral: Positive for depression. Negative for hallucinations, memory loss, substance abuse and suicidal ideas. The patient is nervous/anxious. The patient does not have insomnia.   All other systems reviewed and are negative.   Blood pressure 110/70, pulse 74, temperature 98 F (36.7 C), resp. rate 18, SpO2 99 %.There is no height or weight on file to calculate BMI.  General Appearance: Casual  Eye Contact:  Good  Speech:  Clear and Coherent and Normal Rate  Volume:  Normal  Mood:  Anxious and Depressed  Affect:  Congruent and Depressed  Thought Process:  Coherent, Goal Directed and Linear  Orientation:  Full (Time, Place, and Person)  Thought Content:   Logical  Suicidal Thoughts:  No  Homicidal Thoughts:  No  Memory:  Immediate;   Good Recent;   Good Remote;   Fair  Judgement:  Good  Insight:  Good  Psychomotor Activity:  Normal  Concentration:  Concentration: Good and Attention Span: Good  Recall:  Good  Fund of Knowledge:  Good  Language:  Good  Akathisia:  No  Handed:  Right  AIMS (if indicated):   N/A  Assets:  Agricultural consultant Housing Physical Health Social Support Transportation  ADL's:  Intact  Cognition:  WNL  Sleep:   N/A     Treatment Plan Summary: Plan Bipolar affective disorder [facility])  Discharge Home  Follow up with your current outpatient provider Take all medications as prescribed Avoid the use of alcohol and illicit drugs  Disposition: No evidence of imminent risk to self or others at present.   Patient does not meet criteria for psychiatric inpatient admission. Supportive therapy provided about ongoing stressors. Discussed crisis plan, support from social network, calling 911, coming to the Emergency Department, and calling  Suicide Hotline.  Ethelene Hal, NP 02/17/2018 10:09 AM  Patient seen face-to-face for psychiatric evaluation, chart reviewed and case discussed with the physician extender and developed treatment plan. Reviewed the information documented and agree with the treatment plan.  Buford Dresser, DO 02/17/18 5:41 PM

## 2018-02-17 NOTE — Discharge Instructions (Signed)
For your behavioral health needs, you are advised to continue treatment with your regular outpatient provider. °

## 2018-02-17 NOTE — BH Assessment (Signed)
[facility] Assessment Progress Note  Per Juanetta Beets, DO, this pt does not require psychiatric hospitalization at this time.  Pt presents under IVC initiated by pt's mother, which Dr Sharma Covert has rescinded.  Pt is to be discharged from [facility] with recommendation to continue treatment with her regular outpatient provider.  This has been included in pt's discharge instructions.  Pt's nurse, Aram Beecham, has been notified.  Doylene Canning, MA Triage Specialist [phone]

## 2018-02-17 NOTE — Progress Notes (Signed)
Pt on unit and is tearful.  Pt sts she wants discharge as soon as possible.  Pt is stating "everything is a mistake" and that her mom has her IVC'd often.  Pt sts she does not want inpatient theraphy.  Pt denies pain or discomfort and contracts for safety.

## 2018-04-20 ENCOUNTER — Encounter (INDEPENDENT_AMBULATORY_CARE_PROVIDER_SITE_OTHER): Payer: Self-pay | Admitting: Orthopaedic Surgery

## 2018-04-20 ENCOUNTER — Ambulatory Visit (INDEPENDENT_AMBULATORY_CARE_PROVIDER_SITE_OTHER): Payer: Medicaid Other | Admitting: Orthopaedic Surgery

## 2018-04-20 ENCOUNTER — Ambulatory Visit (INDEPENDENT_AMBULATORY_CARE_PROVIDER_SITE_OTHER): Payer: Medicaid Other

## 2018-04-20 DIAGNOSIS — M79671 Pain in right foot: Secondary | ICD-10-CM

## 2018-04-20 MED ORDER — TRAMADOL HCL 50 MG PO TABS: ORAL_TABLET | ORAL | 0 refills | Status: DC

## 2018-04-20 NOTE — Progress Notes (Signed)
Office Visit Note   Patient: Sarah MurphyLaura M Monette           Date of Birth: Dec 12, 1985           MRN: [id number] Visit Date: 04/20/2018              Requested by: Palma HolterGunadasa, Kanishka G, MD [address] [place], [place] [address] PCP: Palma HolterGunadasa, Kanishka G, MD   Assessment & Plan: Visit Diagnoses:  1. Pain in right foot     Plan: Impression is right foot fourth proximal phalanx fracture.  This will be treated conservatively.  We will buddy tape the third and fourth toes together.  We will provide the patient with a better fitting postoperative shoe.  She can be weightbearing as tolerated.  She will continue to ice and elevate for pain and swelling.  We will call in a prescription for tramadol.  She will follow-up with us in 3 weeks time for recheck.  Call with concerns or questions in the meantime.  Follow-Up Instructions: Return in about 3 weeks (around 05/11/2018).   Orders:  Orders Placed This Encounter  Procedures  . XR Foot Complete Right   No orders of the defined types were placed in this encounter.     Procedures: No procedures performed   Clinical Data: No additional findings.   Subjective: Chief Complaint  Patient presents with  . Right Foot - Pain    HPI patient is a pleasant 32 year old female who presents to our clinic today with a new injury to her right foot.  On 04/19/2018, she called her foot in a flip-flop and fell plantar flexing the right foot.  When this occurred, she noticed that her fourth toe was pointed laterally.  She corrected this herself.  Since then, she has had marked pain primarily to the fourth toe.  Moderate swelling.  No ecchymosis.   she borrowed her mother's postoperative shoe and has been wearing this while weightbearing.  She has been applying ice and elevating as well.  She has been taking Tylenol and Advil with minimal relief of symptoms.  Review of Systems as detailed in HPI.  All others reviewed and are negative.   Objective: Vital  Signs: There were no vitals taken for this visit.  Physical Exam well-developed well-nourished female in no acute distress.  Alert and oriented x3.  Ortho Exam examination of her right foot reveals moderate pain to the fourth toe.  Moderate swelling to the right foot.  No ecchymosis.  No midfoot tenderness.  She is neurovascularly intact distally.  Specialty Comments:  No specialty comments available.  Imaging: Xr Foot Complete Right  Result Date: 04/20/2018 X-rays show a minimally displaced fracture to the proximal fourth phalanx.    PMFS History: Patient Active Problem List   Diagnosis Date Noted  . Pain in right foot 04/20/2018  . Bipolar affective disorder, manic, severe, with psychotic behavior (HCC) 01/01/2018  . Bipolar affective disorder, currently manic, moderate (HCC) 12/31/2017  . Psychosis (HCC)   . Bipolar affective disorder, mixed, severe (HCC) 12/27/2017  . Bipolar affective disorder (HCC) 12/16/2017  . Polysubstance (including opioids) dependence, daily use (HCC) 12/15/2017  . Breast lump on left side at 6 o'clock position 06/29/2013  . Breast lump on right side at 3 o'clock position 06/29/2013  . Cat allergies 05/25/2012  . Family history of systemic lupus erythematosus 04/13/2012  . Back pain 04/13/2012  . Pleuritic chest pain 04/13/2012  . Shin splints 04/13/2012   Past Medical History:  Diagnosis Date  . Allergy   . Asthma    As a child  . Bipolar 1 disorder (HCC)   . Chronic back pain greater than 3 months duration 2013  . Depression   . Fibromyalgia     Family History  Problem Relation Age of Onset  . Alcohol abuse Mother   . Arthritis Mother   . Lupus Father   . Alcohol abuse Father   . Heart disease Father   . Depression Father   . Heart disease Maternal Grandfather     History reviewed. No pertinent surgical history. Social History   Occupational History  . Not on file  Tobacco Use  . Smoking status: Current Some Day Smoker     Types: Cigarettes  . Smokeless tobacco: Never Used  Substance and Sexual Activity  . Alcohol use: No  . Drug use: Yes    Types: Marijuana, Other-see comments    Comment: molly  . Sexual activity: Yes    Birth control/protection: IUD

## 2018-05-12 ENCOUNTER — Ambulatory Visit (INDEPENDENT_AMBULATORY_CARE_PROVIDER_SITE_OTHER): Payer: Medicaid Other | Admitting: Orthopaedic Surgery

## 2019-03-06 ENCOUNTER — Observation Stay (HOSPITAL_COMMUNITY)
Admission: AD | Admit: 2019-03-06 | Discharge: 2019-03-07 | Disposition: A | Discharge status: Home / Self Care | Payer: Medicaid Other | Source: Intra-hospital | Attending: Psychiatry | Admitting: Psychiatry

## 2019-03-06 ENCOUNTER — Encounter (HOSPITAL_COMMUNITY): Payer: Self-pay

## 2019-03-06 ENCOUNTER — Other Ambulatory Visit: Payer: Self-pay

## 2019-03-06 ENCOUNTER — Emergency Department (HOSPITAL_COMMUNITY)
Admission: EM | Admit: 2019-03-06 | Discharge: 2019-03-06 | Disposition: A | Discharge status: Other Acute Inpatient Hospital | Payer: Medicaid Other | Attending: Emergency Medicine | Admitting: Emergency Medicine

## 2019-03-06 ENCOUNTER — Encounter (HOSPITAL_COMMUNITY): Payer: Self-pay | Admitting: Emergency Medicine

## 2019-03-06 DIAGNOSIS — M549 Dorsalgia, unspecified: Secondary | ICD-10-CM | POA: Insufficient documentation

## 2019-03-06 DIAGNOSIS — R45851 Suicidal ideations: Secondary | ICD-10-CM | POA: Insufficient documentation

## 2019-03-06 DIAGNOSIS — Z79899 Other long term (current) drug therapy: Secondary | ICD-10-CM | POA: Insufficient documentation

## 2019-03-06 DIAGNOSIS — Z791 Long term (current) use of non-steroidal anti-inflammatories (NSAID): Secondary | ICD-10-CM | POA: Diagnosis not present

## 2019-03-06 DIAGNOSIS — G8929 Other chronic pain: Secondary | ICD-10-CM | POA: Insufficient documentation

## 2019-03-06 DIAGNOSIS — F1010 Alcohol abuse, uncomplicated: Secondary | ICD-10-CM | POA: Diagnosis not present

## 2019-03-06 DIAGNOSIS — Z1159 Encounter for screening for other viral diseases: Secondary | ICD-10-CM | POA: Insufficient documentation

## 2019-03-06 DIAGNOSIS — F3163 Bipolar disorder, current episode mixed, severe, without psychotic features: Secondary | ICD-10-CM | POA: Diagnosis present

## 2019-03-06 DIAGNOSIS — F319 Bipolar disorder, unspecified: Secondary | ICD-10-CM | POA: Diagnosis not present

## 2019-03-06 DIAGNOSIS — M797 Fibromyalgia: Secondary | ICD-10-CM | POA: Diagnosis not present

## 2019-03-06 DIAGNOSIS — G4700 Insomnia, unspecified: Secondary | ICD-10-CM | POA: Insufficient documentation

## 2019-03-06 DIAGNOSIS — Z8249 Family history of ischemic heart disease and other diseases of the circulatory system: Secondary | ICD-10-CM | POA: Diagnosis not present

## 2019-03-06 DIAGNOSIS — F339 Major depressive disorder, recurrent, unspecified: Secondary | ICD-10-CM

## 2019-03-06 DIAGNOSIS — F419 Anxiety disorder, unspecified: Secondary | ICD-10-CM | POA: Diagnosis not present

## 2019-03-06 DIAGNOSIS — F17210 Nicotine dependence, cigarettes, uncomplicated: Secondary | ICD-10-CM | POA: Insufficient documentation

## 2019-03-06 DIAGNOSIS — J45909 Unspecified asthma, uncomplicated: Secondary | ICD-10-CM | POA: Diagnosis not present

## 2019-03-06 DIAGNOSIS — R5383 Other fatigue: Secondary | ICD-10-CM | POA: Insufficient documentation

## 2019-03-06 LAB — CBC
HCT: 41.5 % (ref 36.0–46.0)
Hemoglobin: 14.1 g/dL (ref 12.0–15.0)
MCH: 31.3 pg (ref 26.0–34.0)
MCHC: 34 g/dL (ref 30.0–36.0)
MCV: 92.2 fL (ref 80.0–100.0)
Platelets: 261 10*3/uL (ref 150–400)
RBC: 4.5 MIL/uL (ref 3.87–5.11)
RDW: 13.7 % (ref 11.5–15.5)
WBC: 9.6 10*3/uL (ref 4.0–10.5)
nRBC: 0 % (ref 0.0–0.2)

## 2019-03-06 LAB — COMPREHENSIVE METABOLIC PANEL
ALT: 31 U/L (ref 0–44)
AST: 29 U/L (ref 15–41)
Albumin: 3.8 g/dL (ref 3.5–5.0)
Alkaline Phosphatase: 139 U/L — ABNORMAL HIGH (ref 38–126)
Anion gap: 13 (ref 5–15)
BUN: <5 mg/dL — ABNORMAL LOW (ref 6–20)
CO2: 23 mmol/L (ref 22–32)
Calcium: 9.1 mg/dL (ref 8.9–10.3)
Chloride: 103 mmol/L (ref 98–111)
Creatinine, Ser: 0.62 mg/dL (ref 0.44–1.00)
GFR calc Af Amer: >60 mL/min (ref >60)
GFR calc non Af Amer: >60 mL/min (ref >60)
Glucose, Bld: 114 mg/dL — ABNORMAL HIGH (ref 70–99)
Potassium: 3.4 mmol/L — ABNORMAL LOW (ref 3.5–5.1)
Sodium: 139 mmol/L (ref 135–145)
Total Bilirubin: 0.3 mg/dL (ref 0.3–1.2)
Total Protein: 7.2 g/dL (ref 6.5–8.1)

## 2019-03-06 LAB — SALICYLATE LEVEL: Salicylate Lvl: <7 mg/dL (ref 2.8–30.0)

## 2019-03-06 LAB — RAPID URINE DRUG SCREEN, HOSP PERFORMED
Amphetamines: NOT DETECTED
Barbiturates: NOT DETECTED
Benzodiazepines: NOT DETECTED
Cocaine: NOT DETECTED
Opiates: NOT DETECTED
Tetrahydrocannabinol: POSITIVE — AB

## 2019-03-06 LAB — I-STAT BETA HCG BLOOD, ED (MC, WL, AP ONLY): I-stat hCG, quantitative: 9.2 m[IU]/mL — ABNORMAL HIGH (ref <5)

## 2019-03-06 LAB — ETHANOL: Alcohol, Ethyl (B): 206 mg/dL — ABNORMAL HIGH (ref <10)

## 2019-03-06 LAB — ACETAMINOPHEN LEVEL
Acetaminophen (Tylenol), Serum: 21 ug/mL (ref 10–30)
Acetaminophen (Tylenol), Serum: <10 ug/mL — ABNORMAL LOW (ref 10–30)

## 2019-03-06 LAB — HCG, QUANTITATIVE, PREGNANCY: hCG, Beta Chain, Quant, S: 1 m[IU]/mL (ref <5)

## 2019-03-06 LAB — SARS CORONAVIRUS 2 BY RT PCR (HOSPITAL ORDER, PERFORMED IN ~~LOC~~ HOSPITAL LAB): SARS Coronavirus 2: NEGATIVE

## 2019-03-06 MED ORDER — LURASIDONE HCL 40 MG PO TABS
40.0000 mg | ORAL_TABLET | Freq: Every day | ORAL | Status: DC
  Administered 2019-03-07: 40 mg via ORAL
  Filled 2019-03-06: qty 1

## 2019-03-06 MED ORDER — ACETAMINOPHEN 325 MG PO TABS
650.0000 mg | ORAL_TABLET | Freq: Four times a day (QID) | ORAL | Status: DC | PRN
  Administered 2019-03-06 – 2019-03-07 (×2): 650 mg via ORAL
  Filled 2019-03-06 (×2): qty 2

## 2019-03-06 MED ORDER — VITAMIN B-1 100 MG PO TABS
100.0000 mg | ORAL_TABLET | Freq: Every day | ORAL | Status: DC
  Administered 2019-03-07: 100 mg via ORAL
  Filled 2019-03-06: qty 1

## 2019-03-06 MED ORDER — HYDROXYZINE HCL 25 MG PO TABS
25.0000 mg | ORAL_TABLET | Freq: Four times a day (QID) | ORAL | Status: DC | PRN
  Administered 2019-03-06: 25 mg via ORAL
  Filled 2019-03-06: qty 1

## 2019-03-06 MED ORDER — LORAZEPAM 1 MG PO TABS: 1.0000 mg | ORAL_TABLET | Freq: Every day | ORAL | Status: DC

## 2019-03-06 MED ORDER — LORAZEPAM 1 MG PO TABS: 1.0000 mg | ORAL_TABLET | Freq: Two times a day (BID) | ORAL | Status: DC

## 2019-03-06 MED ORDER — LOPERAMIDE HCL 2 MG PO CAPS: 2.0000 mg | ORAL_CAPSULE | ORAL | Status: DC | PRN

## 2019-03-06 MED ORDER — ESCITALOPRAM OXALATE 10 MG PO TABS
20.0000 mg | ORAL_TABLET | Freq: Every day | ORAL | Status: DC
  Administered 2019-03-06 – 2019-03-07 (×2): 20 mg via ORAL
  Filled 2019-03-06 (×2): qty 2

## 2019-03-06 MED ORDER — ONDANSETRON 4 MG PO TBDP
4.0000 mg | ORAL_TABLET | Freq: Four times a day (QID) | ORAL | Status: DC | PRN
  Administered 2019-03-07: 4 mg via ORAL
  Filled 2019-03-06: qty 1

## 2019-03-06 MED ORDER — ONDANSETRON HCL 4 MG PO TABS: 4.0000 mg | ORAL_TABLET | Freq: Three times a day (TID) | ORAL | Status: DC | PRN

## 2019-03-06 MED ORDER — OXCARBAZEPINE 300 MG PO TABS
300.0000 mg | ORAL_TABLET | Freq: Every day | ORAL | Status: DC
  Administered 2019-03-06 – 2019-03-07 (×2): 300 mg via ORAL
  Filled 2019-03-06 (×2): qty 1

## 2019-03-06 MED ORDER — NICOTINE 21 MG/24HR TD PT24
21.0000 mg | MEDICATED_PATCH | Freq: Every day | TRANSDERMAL | Status: DC
  Administered 2019-03-06: 21 mg via TRANSDERMAL
  Filled 2019-03-06: qty 1

## 2019-03-06 MED ORDER — MAGNESIUM HYDROXIDE 400 MG/5ML PO SUSP: 30.0000 mL | Freq: Every day | ORAL | Status: DC | PRN

## 2019-03-06 MED ORDER — LORAZEPAM 1 MG PO TABS: 1.0000 mg | ORAL_TABLET | Freq: Three times a day (TID) | ORAL | Status: DC

## 2019-03-06 MED ORDER — GABAPENTIN 100 MG PO CAPS
200.0000 mg | ORAL_CAPSULE | Freq: Three times a day (TID) | ORAL | Status: DC
  Administered 2019-03-06 – 2019-03-07 (×4): 200 mg via ORAL
  Filled 2019-03-06 (×4): qty 2

## 2019-03-06 MED ORDER — IBUPROFEN 800 MG PO TABS
800.0000 mg | ORAL_TABLET | Freq: Four times a day (QID) | ORAL | Status: DC | PRN
  Administered 2019-03-06 – 2019-03-07 (×2): 800 mg via ORAL
  Filled 2019-03-06 (×2): qty 1

## 2019-03-06 MED ORDER — ALUM & MAG HYDROXIDE-SIMETH 200-200-20 MG/5ML PO SUSP: 30.0000 mL | Freq: Four times a day (QID) | ORAL | Status: DC | PRN

## 2019-03-06 MED ORDER — LORAZEPAM 1 MG PO TABS: 1.0000 mg | ORAL_TABLET | Freq: Four times a day (QID) | ORAL | Status: DC | PRN

## 2019-03-06 MED ORDER — LORAZEPAM 1 MG PO TABS
1.0000 mg | ORAL_TABLET | Freq: Four times a day (QID) | ORAL | Status: DC
  Administered 2019-03-06: 1 mg via ORAL
  Filled 2019-03-06: qty 1

## 2019-03-06 MED ORDER — ALUM & MAG HYDROXIDE-SIMETH 200-200-20 MG/5ML PO SUSP: 30.0000 mL | ORAL | Status: DC | PRN

## 2019-03-06 MED ORDER — NICOTINE 21 MG/24HR TD PT24
21.0000 mg | MEDICATED_PATCH | Freq: Every day | TRANSDERMAL | Status: DC
  Administered 2019-03-07: 21 mg via TRANSDERMAL
  Filled 2019-03-06: qty 1

## 2019-03-06 MED ORDER — ADULT MULTIVITAMIN W/MINERALS CH
1.0000 | ORAL_TABLET | Freq: Every day | ORAL | Status: DC
  Administered 2019-03-06 – 2019-03-07 (×2): 1 via ORAL
  Filled 2019-03-06 (×2): qty 1

## 2019-03-06 NOTE — Progress Notes (Signed)
Sarah Mcclure is sleeping. No respiratory distress. No complaints and no problems noted. Will get vital signs. BP elevated on admission.

## 2019-03-06 NOTE — ED Notes (Signed)
Pt in voluntary

## 2019-03-06 NOTE — ED Notes (Signed)
Called patients name in waiting room to update vitals, no response from patient.

## 2019-03-06 NOTE — ED Provider Notes (Signed)
Emergency Medicine Observation Re-evaluation Note  Sarah Mcclure is a 33 y.o. female, seen on rounds today.  Pt initially presented to the ED for complaints of Suicidal Currently, the patient is accepted to [facility] hospital, pending transfer when bed available.  Per nursing, no incidents overnight.  Physical Exam  BP 132/88 (BP Location: Right Arm)   Pulse 65   Temp 98.6 F (37 C) (Oral)   Resp 12   SpO2 100%  Physical Exam Vitals signs and nursing note reviewed.  Constitutional:      General: She is not in acute distress.    Appearance: She is well-developed.  HENT:     Head: Normocephalic and atraumatic.  Eyes:     Conjunctiva/sclera: Conjunctivae normal.  Cardiovascular:     Rate and Rhythm: Normal rate.  Pulmonary:     Effort: Pulmonary effort is normal.  Neurological:     Mental Status: She is alert.  Psychiatric:        Mood and Affect: Mood normal.        Behavior: Behavior normal.     ED Course / MDM  [facility]:XNAT   I have reviewed the labs performed to date as well as medications administered while in observation.  No recent changes in the last 24 hours. Plan  Current plan is for transfer to [facility] for inpatient treatment. Patient is not under full IVC at this time.   Buel Molder, [place] N, PA-C 03/06/19 1107    Little, Ambrose Finland, MD 03/06/19 [phone]

## 2019-03-06 NOTE — ED Notes (Signed)
bfast ordered 

## 2019-03-06 NOTE — ED Notes (Signed)
Per [facility] COVIC-19 test is negative.

## 2019-03-06 NOTE — ED Notes (Signed)
Sarah Mcclure transporting pt to [facility] - ALL belongings - Sarah Mcclure.

## 2019-03-06 NOTE — Progress Notes (Signed)
Patient ID: Sarah Mcclure, female   DOB: 07-11-1986, 33 y.o.   MRN: [id number] D: Patient arrived to [facility] OBS unit from [facility] voluntarily. She has had worsening depression and SI since her break up with boyfriend on-going over the past year. She lives with her children's father and her two children, age 14 and 38. She drinks 7-8 shots of liquor daily, smokes 1ppd, and uses marijuana twice daily. She is not interested in detox, and is fearful of the withdrawal symptoms. She is seeking medication management. Prior admission to adult March 2019 for BP 1 with psychosis. Denies SI, HI, AVH at this time. Her speech is logical and coherent.  A: Patient oriented to unit. Given fluids. Provided support and encouragement. R: Patient resting in room.

## 2019-03-06 NOTE — Progress Notes (Signed)
OOB. Reports back pain with hx of chronic back pain. Continues to have some anxiety. Ibuprofen for back pain and Vistaril for anxiety. Denies current suicidal thoughts. She admits to suicidal thoughts while intoxicated. I asked patient if she believes she has a problem with alcohol and she says yes. She does report she believes she can get it under control on her own and says she has before. Mentioned [facility] as a possible resource if needed. She verbalizes understanding. Snack and Gatorade.

## 2019-03-06 NOTE — ED Notes (Signed)
TTS in progress 

## 2019-03-06 NOTE — ED Provider Notes (Signed)
[facility]   CSN: [id number] Arrival date & time: 03/06/19  0051    History   Chief Complaint Chief Complaint  Patient presents with  . Suicidal    HPI Sarah MurphyLaura M Mcclure is a 33 y.o. female.     The history is provided by the patient.  Mental Health Problem  Presenting symptoms: depression and suicidal thoughts   Presenting symptoms: no self-mutilation, no suicidal threats and no suicide attempt   Patient accompanied by: none. Degree of incapacity (severity):  Moderate Onset quality:  Gradual Duration:  1 day Timing:  Constant Progression:  Unchanged Chronicity:  Recurrent Context: alcohol use, drug abuse and stressful life event   Treatment compliance:  Most of the time Relieved by:  Nothing Worsened by:  Nothing Associated symptoms: no abdominal pain, no chest pain and no hypersomnia   Risk factors: hx of mental illness   Depressed and thought os SI upon breaking up with boyfriend. Alcohol was involved.  8 shots. Daily marijiuana user.    Past Medical History:  Diagnosis Date  . Allergy   . Asthma    As a child  . Bipolar 1 disorder (HCC)   . Chronic back pain greater than 3 months duration 2013  . Depression   . Fibromyalgia     Patient Active Problem List   Diagnosis Date Noted  . Pain in right foot 04/20/2018  . Bipolar affective disorder, manic, severe, with psychotic behavior (HCC) 01/01/2018  . Bipolar affective disorder, currently manic, moderate (HCC) 12/31/2017  . Psychosis (HCC)   . Bipolar affective disorder, mixed, severe (HCC) 12/27/2017  . Bipolar affective disorder (HCC) 12/16/2017  . Polysubstance (including opioids) dependence, daily use (HCC) 12/15/2017  . Breast lump on left side at 6 o'clock position 06/29/2013  . Breast lump on right side at 3 o'clock position 06/29/2013  . Cat allergies 05/25/2012  . Family history of systemic lupus erythematosus 04/13/2012  . Back pain 04/13/2012   . Pleuritic chest pain 04/13/2012  . Shin splints 04/13/2012    History reviewed. No pertinent surgical history.   OB History    Gravida  5   Para  2   Term  2   Preterm      AB  3   Living  2     SAB  2   TAB  1   Ectopic      Multiple      Live Births               Home Medications    Prior to Admission medications   Medication Sig Start Date End Date Taking? Authorizing Provider  ARIPiprazole (ABILIFY) 20 MG tablet Take 1 tablet (20 mg total) by mouth daily. For mood control Patient not taking: Reported on 02/16/2018 01/08/18   Armandina StammerNwoko, Agnes I, NP  ARIPiprazole ER (ABILIFY MAINTENA) 400 MG SRER injection Inject 2 mLs (400 mg total) into the muscle every 28 (twenty-eight) days. (Due on 02-03-18): For mood control 02/03/18   Armandina StammerNwoko, Agnes I, NP  clindamycin (CLEOCIN) 300 MG capsule Take 300 mg by mouth. Every other morning 01/22/18   [provider]  gabapentin (NEURONTIN) 100 MG capsule Take 2 capsules (200 mg total) by mouth 3 (three) times daily. For agitation 01/07/18   Armandina StammerNwoko, Agnes I, NP  hydrocortisone cream 1 % Apply topically 2 (two) times daily. For itching Patient not taking: Reported on 02/16/2018 01/07/18   Sanjuana KavaNwoko, Agnes I, NP  hydrOXYzine (ATARAX/VISTARIL) 50 MG tablet Take 1 tablet (50 mg total) by mouth every 6 (six) hours as needed for anxiety. 01/07/18   Armandina Stammer I, NP  Multiple Vitamin (MULTIVITAMIN WITH MINERALS) TABS tablet Take 1 tablet by mouth daily.    [provider]  mupirocin cream (BACTROBAN) 2 % Apply topically 2 (two) times daily. For wound care Patient not taking: Reported on 02/16/2018 01/07/18   Armandina Stammer I, NP  nicotine polacrilex (NICORETTE) 2 MG gum Take 1 each (2 mg total) by mouth as needed for smoking cessation. (May purchase from over the counter): For smoking cessation Patient not taking: Reported on 02/16/2018 01/07/18   Armandina Stammer I, NP  Oxcarbazepine (TRILEPTAL) 300 MG tablet Take 1 tablet (300 mg total) by  mouth 2 (two) times daily. For mood stabilization 01/07/18   Armandina Stammer I, NP  sulfamethoxazole-trimethoprim (BACTRIM DS,SEPTRA DS) 800-160 MG tablet Take 1 tablet by mouth every 12 (twelve) hours. For urinary tract infection Patient not taking: Reported on 02/16/2018 01/07/18   Armandina Stammer I, NP  traMADol (ULTRAM) 50 MG tablet TAKE 1-2 TABS PO Q6-8 HOURS PRN PAIN 04/20/18   Cristie Hem, PA-C  traZODone (DESYREL) 100 MG tablet Take 1 tablet (100 mg total) by mouth at bedtime. For sleep Patient not taking: Reported on 02/16/2018 01/07/18   Sanjuana Kava, NP    Family History Family History  Problem Relation Age of Onset  . Alcohol abuse Mother   . Arthritis Mother   . Lupus Father   . Alcohol abuse Father   . Heart disease Father   . Depression Father   . Heart disease Maternal Grandfather     Social History Social History   Tobacco Use  . Smoking status: Current Some Day Smoker    Types: Cigarettes  . Smokeless tobacco: Never Used  Substance Use Topics  . Alcohol use: No  . Drug use: Yes    Types: Marijuana, Other-see comments, IV     Allergies   Amoxicillin and Penicillins   Review of Systems Review of Systems  Constitutional: Negative for fever.  HENT: Negative for sore throat.   Respiratory: Negative for cough and shortness of breath.   Cardiovascular: Negative for chest pain.  Gastrointestinal: Negative for abdominal pain.  Neurological: Negative for seizures.  Psychiatric/Behavioral: Positive for suicidal ideas. Negative for self-injury.  All other systems reviewed and are negative.    Physical Exam Updated Vital Signs BP (!) 105/57   Pulse 73   Temp 98.4 F (36.9 C) (Oral)   Resp 20   SpO2 98%   Physical Exam Vitals signs and nursing note reviewed.  Constitutional:      General: She is not in acute distress.    Appearance: She is normal weight.  HENT:     Head: Normocephalic and atraumatic.     Nose: Nose normal.  Eyes:      Conjunctiva/sclera: Conjunctivae normal.     Pupils: Pupils are equal, round, and reactive to light.  Neck:     Musculoskeletal: Normal range of motion and neck supple.  Cardiovascular:     Rate and Rhythm: Normal rate and regular rhythm.     Pulses: Normal pulses.     Heart sounds: Normal heart sounds.  Pulmonary:     Effort: Pulmonary effort is normal.     Breath sounds: Normal breath sounds.  Abdominal:     General: Abdomen is flat. Bowel sounds are normal.     Tenderness: There is  no abdominal tenderness. There is no guarding or rebound.  Musculoskeletal: Normal range of motion.  Skin:    General: Skin is warm and dry.     Capillary Refill: Capillary refill takes less than 2 seconds.  Neurological:     General: No focal deficit present.     Mental Status: She is alert and oriented to person, place, and time.  Psychiatric:        Mood and Affect: Mood is not anxious. Affect is not labile.      ED Treatments / Results  Labs (all labs ordered are listed, but only abnormal results are displayed) Results for orders placed or performed during the hospital encounter of 03/06/19  Comprehensive metabolic panel  Result Value Ref Range   Sodium 139 135 - 145 mmol/L   Potassium 3.4 (L) 3.5 - 5.1 mmol/L   Chloride 103 98 - 111 mmol/L   CO2 23 22 - 32 mmol/L   Glucose, Bld 114 (H) 70 - 99 mg/dL   BUN <5 (L) 6 - 20 mg/dL   Creatinine, Ser 2.08 0.44 - 1.00 mg/dL   Calcium 9.1 8.9 - 02.2 mg/dL   Total Protein 7.2 6.5 - 8.1 g/dL   Albumin 3.8 3.5 - 5.0 g/dL   AST 29 15 - 41 U/L   ALT 31 0 - 44 U/L   Alkaline Phosphatase 139 (H) 38 - 126 U/L   Total Bilirubin 0.3 0.3 - 1.2 mg/dL   GFR calc non Af Amer >60 >60 mL/min   GFR calc Af Amer >60 >60 mL/min   Anion gap 13 5 - 15  Ethanol  Result Value Ref Range   Alcohol, Ethyl (B) 206 (H) <10 mg/dL  Salicylate level  Result Value Ref Range   Salicylate Lvl <7.0 2.8 - 30.0 mg/dL  Acetaminophen level  Result Value Ref Range    Acetaminophen (Tylenol), Serum 21 10 - 30 ug/mL  cbc  Result Value Ref Range   WBC 9.6 4.0 - 10.5 K/uL   RBC 4.50 3.87 - 5.11 MIL/uL   Hemoglobin 14.1 12.0 - 15.0 g/dL   HCT 33.6 12.2 - 44.9 %   MCV 92.2 80.0 - 100.0 fL   MCH 31.3 26.0 - 34.0 pg   MCHC 34.0 30.0 - 36.0 g/dL   RDW 75.3 00.5 - 11.0 %   Platelets 261 150 - 400 K/uL   nRBC 0.0 0.0 - 0.2 %  Rapid urine drug screen (hospital performed)  Result Value Ref Range   Opiates NONE DETECTED NONE DETECTED   Cocaine NONE DETECTED NONE DETECTED   Benzodiazepines NONE DETECTED NONE DETECTED   Amphetamines NONE DETECTED NONE DETECTED   Tetrahydrocannabinol POSITIVE (A) NONE DETECTED   Barbiturates NONE DETECTED NONE DETECTED  hCG, quantitative, pregnancy  Result Value Ref Range   hCG, Beta Chain, Quant, S 1 <5 mIU/mL  Acetaminophen level  Result Value Ref Range   Acetaminophen (Tylenol), Serum <10 (L) 10 - 30 ug/mL  I-Stat beta hCG blood, ED  Result Value Ref Range   I-stat hCG, quantitative 9.2 (H) <5 mIU/mL   Comment 3           No results found.  EKG None  Radiology No results found.  Procedures Procedures (including critical care time)  Medications Ordered in ED Medications  ondansetron (ZOFRAN) tablet 4 mg (has no administration in time range)  alum & mag hydroxide-simeth (MAALOX/MYLANTA) 200-200-20 MG/5ML suspension 30 mL (has no administration in time range)  nicotine (NICODERM CQ -  dosed in mg/24 hours) patch 21 mg (has no administration in time range)      Final Clinical Impressions(s) / ED Diagnoses   Final diagnoses:  Episode of recurrent major depressive disorder, unspecified depression episode severity (HCC)    Medically cleared by me for psychiatry.  Disposition by that service holding orders placed.     Emmanual Gauthreaux, MD 03/06/19 [phone]

## 2019-03-06 NOTE — H&P (Signed)
BH Observation Unit Provider Admission PAA/H&P  Patient Identification: Sarah Mcclure MRN:  [id number] Date of Evaluation:  03/06/2019 Chief Complaint:  Bipolar Disorder Affective Disorder, Mixed-Severe ETOH Use Disorder, Severe Principal Diagnosis: Bipolar 1 disorder (HCC) Diagnosis:  Principal Problem:   Bipolar 1 disorder (HCC) Active Problems:   ETOH abuse  History of Present Illness: Pt was admitted to [facility] OBS unit  from [facility]. She is voluntary. She reported feeling suicidal after a break up with her boyfriend over 1 year ago. She has been living with hte father of her children who is verbally abusive and drinks. She has been drinking, BAL 206, UDS positive for THC. Pt She recently found out that her ex-boyfriend has been talking about her behind her back. She finds her situation unbearable and she feels trapped. She stated she could never kill herself because of her children. She has been having increased panic attacks as a result. She does endorse suicidal thoughts but has no specific plan. She has not been taking her medications regularly and she last went to [place] in January. Her home medications were restarted and she will remain on the OBS unit for safety and observation.   Associated Signs/Symptoms: Depression Symptoms:  depressed mood, insomnia, fatigue, difficulty concentrating, hopelessness, suicidal thoughts without plan, anxiety, loss of energy/fatigue, disturbed sleep, (Hypo) Manic Symptoms:  Distractibility, Irritable Mood, Anxiety Symptoms:  Excessive Worry, Psychotic Symptoms:  N/A PTSD Symptoms: NA Total Time spent with patient: 30 minutes  Past Psychiatric History: As above  Is the patient at risk to self? Yes.    Has the patient been a risk to self in the past 6 months? No.  Has the patient been a risk to self within the distant past? Yes.    Is the patient a risk to others? No.  Has the patient been a risk to others in the past 6 months? No.  Has the  patient been a risk to others within the distant past? No.   Prior Inpatient Therapy:  Yes Prior Outpatient Therapy:  Yes  Alcohol Screening: 1. How often do you have a drink containing alcohol?: 4 or more times a week 2. How many drinks containing alcohol do you have on a typical day when you are drinking?: 7, 8, or 9 3. How often do you have six or more drinks on one occasion?: Daily or almost daily AUDIT-C Score: 11 4. How often during the last year have you found that you were not able to stop drinking once you had started?: Daily or almost daily 5. How often during the last year have you failed to do what was normally expected from you becasue of drinking?: Daily or almost daily 6. How often during the last year have you needed a first drink in the morning to get yourself going after a heavy drinking session?: Daily or almost daily 7. How often during the last year have you had a feeling of guilt of remorse after drinking?: Weekly 8. How often during the last year have you been unable to remember what happened the night before because you had been drinking?: Weekly 9. Have you or someone else been injured as a result of your drinking?: No 10. Has a relative or friend or a doctor or another health worker been concerned about your drinking or suggested you cut down?: No Alcohol Use Disorder Identification Test Final Score (AUDIT): 29 Alcohol Brief Interventions/Follow-up: Patient Refused Substance Abuse History in the last 12 months:  Yes.   Consequences  of Substance Abuse: Family Consequences:  Loss of relationships Previous Psychotropic Medications: Yes  Psychological Evaluations: No  Past Medical History:  Past Medical History:  Diagnosis Date  . Allergy   . Asthma    As a child  . Bipolar 1 disorder (HCC)   . Chronic back pain greater than 3 months duration 2013  . Depression   . Fibromyalgia    History reviewed. No pertinent surgical history. Family History:  Family History   Problem Relation Age of Onset  . Alcohol abuse Mother   . Arthritis Mother   . Lupus Father   . Alcohol abuse Father   . Heart disease Father   . Depression Father   . Heart disease Maternal Grandfather    Family Psychiatric History: Pt did not give this information Tobacco Screening: Have you used any form of tobacco in the last 30 days? (Cigarettes, Smokeless Tobacco, Cigars, and/or Pipes): Yes Tobacco use, Select all that apply: 5 or more cigarettes per day Are you interested in Tobacco Cessation Medications?: Yes, will notify MD for an order Counseled patient on smoking cessation including recognizing danger situations, developing coping skills and basic information about quitting provided: Yes Social History:  Social History   Substance and Sexual Activity  Alcohol Use Yes  . Alcohol/week: 56.0 standard drinks  . Types: 56 Shots of liquor per week   Comment: daily     Social History   Substance and Sexual Activity  Drug Use Yes  . Types: Marijuana, IV   Comment: 1-2x daily    Additional Social History:       Allergies:   Allergies  Allergen Reactions  . Amoxicillin Other (See Comments)    Has patient had a PCN reaction causing immediate rash, facial/tongue/throat swelling, SOB or lightheadedness with hypotension: Unknown Has patient had a PCN reaction causing severe rash involving mucus membranes or skin necrosis: No Has patient had a PCN reaction that required hospitalization: Unknown Has patient had a PCN reaction occurring within the last 10 years: No If all of the above answers are "NO", then may proceed with Cephalosporin use.   Marland Kitchen Penicillins Other (See Comments)    Has patient had a PCN reaction causing immediate rash, facial/tongue/throat swelling, SOB or lightheadedness with hypotension: Unknown Has patient had a PCN reaction causing severe rash involving mucus membranes or skin necrosis: No Has patient had a PCN reaction that required hospitalization:  Unknown Has patient had a PCN reaction occurring within the last 10 years: No If all of the above answers are "NO", then may proceed with Cephalosporin use.    Lab Results:  Results for orders placed or performed during the hospital encounter of 03/06/19 (from the past 48 hour(s))  Comprehensive metabolic panel     Status: Abnormal   Collection Time: 03/06/19  1:00 AM  Result Value Ref Range   Sodium 139 135 - 145 mmol/L   Potassium 3.4 (L) 3.5 - 5.1 mmol/L   Chloride 103 98 - 111 mmol/L   CO2 23 22 - 32 mmol/L   Glucose, Bld 114 (H) 70 - 99 mg/dL   BUN <5 (L) 6 - 20 mg/dL   Creatinine, Ser 1.61 0.44 - 1.00 mg/dL   Calcium 9.1 8.9 - 09.6 mg/dL   Total Protein 7.2 6.5 - 8.1 g/dL   Albumin 3.8 3.5 - 5.0 g/dL   AST 29 15 - 41 U/L   ALT 31 0 - 44 U/L   Alkaline Phosphatase 139 (H) 38 - 126  U/L   Total Bilirubin 0.3 0.3 - 1.2 mg/dL   GFR calc non Af Amer >60 >60 mL/min   GFR calc Af Amer >60 >60 mL/min   Anion gap 13 5 - 15    Comment: Performed at [facility], [address] [place], [place] [address]  Ethanol     Status: Abnormal   Collection Time: 03/06/19  1:00 AM  Result Value Ref Range   Alcohol, Ethyl (B) 206 (H) <10 mg/dL    Comment: (NOTE) Lowest detectable limit for serum alcohol is 10 mg/dL. For medical purposes only. Performed at [facility], [address], [place], [place] [address]   Salicylate level     Status: None   Collection Time: 03/06/19  1:00 AM  Result Value Ref Range   Salicylate Lvl <7.0 2.8 - 30.0 mg/dL    Comment: Performed at [facility], [address], [place], [place] [address]  Acetaminophen level     Status: None   Collection Time: 03/06/19  1:00 AM  Result Value Ref Range   Acetaminophen (Tylenol), Serum 21 10 - 30 ug/mL    Comment: (NOTE) Therapeutic concentrations vary significantly. A range of 10-30 ug/mL  may be an effective concentration for many patients. However, some  are best treated at concentrations  outside of this range. Acetaminophen concentrations >150 ug/mL at 4 hours after ingestion  and >50 ug/mL at 12 hours after ingestion are often associated with  toxic reactions. Performed at [facility], [address], [place], [place] [address]   cbc     Status: None   Collection Time: 03/06/19  1:00 AM  Result Value Ref Range   WBC 9.6 4.0 - 10.5 K/uL   RBC 4.50 3.87 - 5.11 MIL/uL   Hemoglobin 14.1 12.0 - 15.0 g/dL   HCT 24.4 01.0 - 27.2 %   MCV 92.2 80.0 - 100.0 fL   MCH 31.3 26.0 - 34.0 pg   MCHC 34.0 30.0 - 36.0 g/dL   RDW 53.6 64.4 - 03.4 %   Platelets 261 150 - 400 K/uL   nRBC 0.0 0.0 - 0.2 %    Comment: Performed at [facility] Lab, [address] [place], [place] [address]  hCG, quantitative, pregnancy     Status: None   Collection Time: 03/06/19  1:00 AM  Result Value Ref Range   hCG, Beta Chain, Quant, S 1 <5 mIU/mL    Comment:          GEST. AGE      CONC.  (mIU/mL)   <=1 WEEK        5 - 50     2 WEEKS       50 - 500     3 WEEKS       100 - 10,000     4 WEEKS     1,000 - 30,000     5 WEEKS     3,500 - 115,000   6-8 WEEKS     12,000 - 270,000    12 WEEKS     15,000 - 220,000        FEMALE AND NON-PREGNANT FEMALE:     LESS THAN 5 mIU/mL Performed at [facility], [address], [place], [place] [address]   I-Stat beta hCG blood, ED     Status: Abnormal   Collection Time: 03/06/19  1:04 AM  Result Value Ref Range   I-stat hCG,  quantitative 9.2 (H) <5 mIU/mL   Comment 3            Comment:   GEST. AGE      CONC.  (mIU/mL)   <=1 WEEK        5 - 50     2 WEEKS       50 - 500     3 WEEKS       100 - 10,000     4 WEEKS     1,000 - 30,000        FEMALE AND NON-PREGNANT FEMALE:     LESS THAN 5 mIU/mL   Rapid urine drug screen (hospital performed)     Status: Abnormal   Collection Time: 03/06/19  1:27 AM  Result Value Ref Range   Opiates NONE DETECTED NONE DETECTED   Cocaine NONE DETECTED NONE DETECTED   Benzodiazepines NONE DETECTED NONE  DETECTED   Amphetamines NONE DETECTED NONE DETECTED   Tetrahydrocannabinol POSITIVE (A) NONE DETECTED   Barbiturates NONE DETECTED NONE DETECTED    Comment: (NOTE) DRUG SCREEN FOR MEDICAL PURPOSES ONLY.  IF CONFIRMATION IS NEEDED FOR ANY PURPOSE, NOTIFY LAB WITHIN 5 DAYS. LOWEST DETECTABLE LIMITS FOR URINE DRUG SCREEN Drug Class                     Cutoff (ng/mL) Amphetamine and metabolites    1000 Barbiturate and metabolites    200 Benzodiazepine                 200 Tricyclics and metabolites     300 Opiates and metabolites        300 Cocaine and metabolites        300 THC                            50 Performed at [facility], [address] [place], [place] [address]   Acetaminophen level     Status: Abnormal   Collection Time: 03/06/19  4:25 AM  Result Value Ref Range   Acetaminophen (Tylenol), Serum <10 (L) 10 - 30 ug/mL    Comment: (NOTE) Therapeutic concentrations vary significantly. A range of 10-30 ug/mL  may be an effective concentration for many patients. However, some  are best treated at concentrations outside of this range. Acetaminophen concentrations >150 ug/mL at 4 hours after ingestion  and >50 ug/mL at 12 hours after ingestion are often associated with  toxic reactions. Performed at [facility], [address] [place], [place] [address]   SARS Coronavirus 2 (CEPHEID - Performed in [facility] hospital lab), Hosp Order     Status: None   Collection Time: 03/06/19  5:42 AM  Result Value Ref Range   SARS Coronavirus 2 NEGATIVE NEGATIVE    Comment: (NOTE) If result is NEGATIVE SARS-CoV-2 target nucleic acids are NOT DETECTED. The SARS-CoV-2 RNA is generally detectable in upper and lower  respiratory specimens during the acute phase of infection. The lowest  concentration of SARS-CoV-2 viral copies this assay can detect is 250  copies / mL. A negative result does not preclude SARS-CoV-2 infection  and should not be used as the sole basis  for treatment or other  patient management decisions.  A negative result may occur with  improper specimen collection / handling, submission of specimen other  than nasopharyngeal swab, presence of viral mutation(s) within the  areas targeted by this assay, and  inadequate number of viral copies  (<250 copies / mL). A negative result must be combined with clinical  observations, patient history, and epidemiological information. If result is POSITIVE SARS-CoV-2 target nucleic acids are DETECTED. The SARS-CoV-2 RNA is generally detectable in upper and lower  respiratory specimens dur ing the acute phase of infection.  Positive  results are indicative of active infection with SARS-CoV-2.  Clinical  correlation with patient history and other diagnostic information is  necessary to determine patient infection status.  Positive results do  not rule out bacterial infection or co-infection with other viruses. If result is PRESUMPTIVE POSTIVE SARS-CoV-2 nucleic acids MAY BE PRESENT.   A presumptive positive result was obtained on the submitted specimen  and confirmed on repeat testing.  While 2019 novel coronavirus  (SARS-CoV-2) nucleic acids may be present in the submitted sample  additional confirmatory testing may be necessary for epidemiological  and / or clinical management purposes  to differentiate between  SARS-CoV-2 and other Sarbecovirus currently known to infect humans.  If clinically indicated additional testing with an alternate test  methodology [phone]) is advised. The SARS-CoV-2 RNA is generally  detectable in upper and lower respiratory sp ecimens during the acute  phase of infection. The expected result is Negative. Fact Sheet for Patients:  BoilerBrush.com.cy Fact Sheet for Healthcare Providers: https://pope.com/ This test is not yet approved or cleared by the [place] FDA and has been authorized for detection and/or  diagnosis of SARS-CoV-2 by FDA under an Emergency Use Authorization (EUA).  This EUA will remain in effect (meaning this test can be used) for the duration of the COVID-19 declaration under Section 564(b)(1) of the Act, 21 U.S.C. section 360bbb-3(b)(1), unless the authorization is terminated or revoked sooner. Performed at [facility] Lab, [address], [place], [place] [address]     Blood Alcohol level:  Lab Results  Component Value Date   ETH 206 (H) 03/06/2019   ETH <10 02/16/2018    Metabolic Disorder Labs:  Lab Results  Component Value Date   HGBA1C 5.0 12/17/2017   MPG 96.8 12/17/2017   Lab Results  Component Value Date   PROLACTIN 18.8 12/17/2017   Lab Results  Component Value Date   CHOL 128 12/17/2017   TRIG 76 12/17/2017   HDL 44 12/17/2017   CHOLHDL 2.9 12/17/2017   VLDL 15 12/17/2017   LDLCALC 69 12/17/2017    Current Medications: Current Facility-Administered Medications  Medication Dose Route Frequency Provider Last Rate Last Dose  . acetaminophen (TYLENOL) tablet 650 mg  650 mg Oral Q6H PRN Nira Conn A, NP   650 mg at 03/06/19 1203  . alum & mag hydroxide-simeth (MAALOX/MYLANTA) 200-200-20 MG/5ML suspension 30 mL  30 mL Oral Q4H PRN Nira Conn A, NP      . escitalopram (LEXAPRO) tablet 20 mg  20 mg Oral Daily Nira Conn A, NP   20 mg at 03/06/19 1152  . gabapentin (NEURONTIN) capsule 200 mg  200 mg Oral TID Laveda Abbe, NP   200 mg at 03/06/19 1345  . hydrOXYzine (ATARAX/VISTARIL) tablet 25 mg  25 mg Oral Q6H PRN Nira Conn A, NP      . ibuprofen (ADVIL) tablet 800 mg  800 mg Oral Q6H PRN Nira Conn A, NP      . loperamide (IMODIUM) capsule 2-4 mg  2-4 mg Oral PRN Jackelyn Poling, NP      . Melene Muller ON 03/07/2019] lurasidone (LATUDA) tablet 40 mg  40 mg Oral  Q breakfast Nira Conn A, NP      . magnesium hydroxide (MILK OF MAGNESIA) suspension 30 mL  30 mL Oral Daily PRN Nira Conn A, NP      . multivitamin with minerals tablet  1 tablet  1 tablet Oral Daily Nira Conn A, NP   1 tablet at 03/06/19 1152  . nicotine (NICODERM CQ - dosed in mg/24 hours) patch 21 mg  21 mg Transdermal Daily Nira Conn A, NP      . ondansetron (ZOFRAN-ODT) disintegrating tablet 4 mg  4 mg Oral Q6H PRN Jackelyn Poling, NP      . Oxcarbazepine (TRILEPTAL) tablet 300 mg  300 mg Oral Daily Nira Conn A, NP   300 mg at 03/06/19 1152  . [START ON 03/07/2019] thiamine (VITAMIN B-1) tablet 100 mg  100 mg Oral Daily Nira Conn A, NP       PTA Medications: Medications Prior to Admission  Medication Sig Dispense Refill Last Dose  . acetaminophen (TYLENOL) 325 MG tablet Take 650 mg by mouth every 6 (six) hours as needed for mild pain.   03/05/2019 at Unknown time  . ARIPiprazole (ABILIFY) 20 MG tablet Take 1 tablet (20 mg total) by mouth daily. For mood control (Patient not taking: Reported on 02/16/2018) 30 tablet 0 Not Taking at Unknown time  . ARIPiprazole ER (ABILIFY MAINTENA) 400 MG SRER injection Inject 2 mLs (400 mg total) into the muscle every 28 (twenty-eight) days. (Due on 02-03-18): For mood control (Patient not taking: Reported on 03/06/2019) 1 each 0 Not Taking at Unknown time  . escitalopram (LEXAPRO) 20 MG tablet Take 20 mg by mouth daily.   03/05/2019 at Unknown time  . gabapentin (NEURONTIN) 100 MG capsule Take 2 capsules (200 mg total) by mouth 3 (three) times daily. For agitation (Patient not taking: Reported on 03/06/2019) 180 capsule 0 Not Taking at Unknown time  . hydrocortisone cream 1 % Apply topically 2 (two) times daily. For itching (Patient not taking: Reported on 02/16/2018) 30 g 0 Not Taking at Unknown time  . hydrOXYzine (ATARAX/VISTARIL) 50 MG tablet Take 1 tablet (50 mg total) by mouth every 6 (six) hours as needed for anxiety. (Patient not taking: Reported on 03/06/2019) 75 tablet 0 Not Taking at Unknown time  . ibuprofen (ADVIL) 200 MG tablet Take 800 mg by mouth every 6 (six) hours as needed for moderate pain.   03/05/2019 at  Unknown time  . lurasidone (LATUDA) 40 MG TABS tablet Take 40 mg by mouth daily with breakfast.   03/05/2019 at Unknown time  . mupirocin cream (BACTROBAN) 2 % Apply topically 2 (two) times daily. For wound care (Patient not taking: Reported on 02/16/2018) 15 g 0 Not Taking at Unknown time  . nicotine polacrilex (NICORETTE) 2 MG gum Take 1 each (2 mg total) by mouth as needed for smoking cessation. (May purchase from over the counter): For smoking cessation (Patient not taking: Reported on 02/16/2018) 100 tablet 0 Not Taking at Unknown time  . Oxcarbazepine (TRILEPTAL) 300 MG tablet Take 1 tablet (300 mg total) by mouth 2 (two) times daily. For mood stabilization (Patient taking differently: Take 300 mg by mouth daily. For mood stabilization) 60 tablet 0 03/05/2019 at Unknown time  . sulfamethoxazole-trimethoprim (BACTRIM DS,SEPTRA DS) 800-160 MG tablet Take 1 tablet by mouth every 12 (twelve) hours. For urinary tract infection (Patient not taking: Reported on 02/16/2018)   Not Taking at Unknown time  . traMADol (ULTRAM) 50 MG tablet TAKE 1-2  TABS PO Q6-8 HOURS PRN PAIN (Patient not taking: Reported on 03/06/2019) 40 tablet 0 Not Taking at Unknown time  . traZODone (DESYREL) 100 MG tablet Take 1 tablet (100 mg total) by mouth at bedtime. For sleep (Patient not taking: Reported on 02/16/2018) 30 tablet 0 Not Taking at Unknown time    Musculoskeletal: Strength & Muscle Tone: within normal limits Gait & Station: normal Patient leans: N/A  Psychiatric Specialty Exam: Physical Exam  Constitutional: She is oriented to person, place, and time. She appears well-developed and well-nourished.  HENT:  Head: Normocephalic.  Respiratory: Effort normal.  Musculoskeletal: Normal range of motion.  Neurological: She is alert and oriented to person, place, and time.    ROS  Blood pressure (!) 148/109, pulse 74, temperature 98.5 F (36.9 C), temperature source Oral, resp. rate 16, last menstrual period 02/21/2019, SpO2  99 %.There is no height or weight on file to calculate BMI.  General Appearance: Casual  Eye Contact:  Good  Speech:  Clear and Coherent and Normal Rate  Volume:  Normal  Mood:  Anxious and Depressed  Affect:  Congruent and Depressed  Thought Process:  Coherent and Descriptions of Associations: Intact  Orientation:  Full (Time, Place, and Person)  Thought Content:  Logical  Suicidal Thoughts:  Yes.  without intent/plan  Homicidal Thoughts:  No  Memory:  Immediate;   Good Recent;   Good Remote;   Fair  Judgement:  Fair  Insight:  Fair  Psychomotor Activity:  Normal  Concentration:  Concentration: Good and Attention Span: Good  Recall:  Good  Fund of Knowledge:  Good  Language:  Good  Akathisia:  Negative  Handed:  Right  AIMS (if indicated):     Assets:  Architect Housing Physical Health Resilience Social Support  ADL's:  Intact  Cognition:  WNL  Sleep:         Treatment Plan Summary: Daily contact with patient to assess and evaluate symptoms and progress in treatment, Medication management and Plan ad mit to [facility] OBS unit   Observation Level/Precautions:  15 minute checks Laboratory:  Labs from [facility] reviewed.  Medications:   [facility] Home medications restarted Discharge Concerns:  Safety and substance abuse Estimated LOS:24-48 hours Other:      Laveda Abbe, NP 5/23/20201:48 PM

## 2019-03-06 NOTE — Progress Notes (Signed)
Sarah Mcclure is awake for vital signs. Her BP is improved. She reports some anxiety and rates it a 5# on 1-10# scale with 10# being the worse.

## 2019-03-06 NOTE — ED Notes (Signed)
Belongings removed, pt placed in paper scrubs. Wanded by security.  
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
## 2019-03-06 NOTE — ED Notes (Signed)
Pt changed into scrubs and wanded by security  

## 2019-03-06 NOTE — ED Triage Notes (Signed)
Patient is in triage waiting.

## 2019-03-06 NOTE — ED Notes (Signed)
Pt aware being admitted to [facility] Obs Unit - pt signed correct consent form - copy faxed to [facility] - copy sent to Medical Records - original placed in envelope for [facility].

## 2019-03-06 NOTE — Progress Notes (Addendum)
Patient's skin assessed and patient searched for contraband by Lorin Glass, RN. Patient has several scars (old lacerations from self-cutting behaviors) to bilateral forearms. She has a tattoo on right foot and left upper back.

## 2019-03-06 NOTE — ED Notes (Signed)
Pt voiced understanding and agreement w/tx plan - accepted to [facility] 203-1 - Pt signed consent form - copy faxed to [facility] - copy sent to Medical Records - original placed in envelope for [facility]. ALL Belongings - 1 labeled belongings bag w/Home Meds and 1 valuables envelope - [place] - Pt aware.

## 2019-03-06 NOTE — ED Notes (Addendum)
Called [facility] to inquire re: COVID-19 results. Advised "presumptive positive" so [facility] is repeating test.

## 2019-03-06 NOTE — ED Triage Notes (Signed)
BIB [facility] from [facility]. Pt states she is feeling suicidal d/t recent breakup with boyfriend. ETOH on board.

## 2019-03-06 NOTE — BH Assessment (Signed)
[facility] Assessment Progress Note   Patient accepted to [facility] Observation room 203-1 to Dr. Roosvelt Harps.  Nurse call report to [phone].  Pt is voluntary and can come after 08:00.    Please fax admission papers to [phone] prior to arrival.

## 2019-03-07 DIAGNOSIS — R45851 Suicidal ideations: Secondary | ICD-10-CM | POA: Diagnosis not present

## 2019-03-07 DIAGNOSIS — F319 Bipolar disorder, unspecified: Secondary | ICD-10-CM | POA: Diagnosis not present
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
## 2019-03-07 NOTE — Progress Notes (Signed)
D: Pt A & O X 3. Denies SI, HI, AVH and pain at this time. D/C home as ordered. Picked up in lobby by children's father. A: D/C instructions reviewed with pt including prescriptions and follow up care, compliance encouraged. All belongings from locker #[id number] given to pt at time of departure. Scheduled and PRN medications given with verbal education and effects monitored. Safety checks maintained without incident till time of d/c.  R: Pt receptive to care. Compliant with medications when offered. Denies adverse drug reactions when assessed. Verbalized understanding related to d/c instructions. Signed belonging sheet in agreement with items received from locker. Ambulatory with a steady gait. Appears to be in no physical distress at time of departure.

## 2019-03-07 NOTE — Progress Notes (Signed)
   03/07/19 0747  COVID-19 Daily Checkoff  Have you had a fever (temp > 37.80C/100F)  in the past 24 hours?  No  Have you had any of these symptoms in the past 24 hours? Sore throat  If you have had runny nose, nasal congestion, sneezing in the past 24 hours, has it worsened? No  COVID-19 EXPOSURE  Have you traveled outside the state in the past 14 days? No  Have you been in contact with someone with a confirmed diagnosis of COVID-19 or PUI in the past 14 days without wearing appropriate PPE? No  Have you been living in the same home as a person with confirmed diagnosis of COVID-19 or a PUI (household contact)? No  Have you been diagnosed with COVID-19? No
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
## 2019-03-07 NOTE — Progress Notes (Signed)
D: Patient is calm and cooperative. She appears less depressed and hopeless than yesterday. Her primary concern is returning home and drinking with her children's father. She states her problems occur due to social drinking in the home setting. She denies SI/HI/AVH. She continues to have chronic upper back pain r/t fibromyalgia. Her appetite is good, and she is eating each meal. She reports sleeping well last night, but awakening early this morning. She took several long naps yesterday, that contributed to this. She is not interested in alcohol detox at this time, but is considering AA after discharge. She does not intend to stop smoking yet. A: Provided support and encouragement. Discussed benefits of AA and smoking cessation. Educated on medications. R: Patient verbalizes she is ready for discharge.

## 2019-03-07 NOTE — Progress Notes (Addendum)
Sleeping after receiving Vistaril and Ibuprofen. I will not not wake her  for CIWA. Continue current plan of care.

## 2022-01-20 ENCOUNTER — Emergency Department (HOSPITAL_BASED_OUTPATIENT_CLINIC_OR_DEPARTMENT_OTHER)
Admission: EM | Admit: 2022-01-20 | Discharge: 2022-01-20 | Disposition: A | Discharge status: Home / Self Care | Payer: Medicaid Other | Attending: Emergency Medicine | Admitting: Emergency Medicine

## 2022-01-20 ENCOUNTER — Emergency Department (HOSPITAL_BASED_OUTPATIENT_CLINIC_OR_DEPARTMENT_OTHER): Payer: Medicaid Other

## 2022-01-20 ENCOUNTER — Other Ambulatory Visit: Payer: Self-pay

## 2022-01-20 ENCOUNTER — Encounter (HOSPITAL_BASED_OUTPATIENT_CLINIC_OR_DEPARTMENT_OTHER): Payer: Self-pay | Admitting: Emergency Medicine

## 2022-01-20 DIAGNOSIS — R1031 Right lower quadrant pain: Secondary | ICD-10-CM | POA: Diagnosis present

## 2022-01-20 DIAGNOSIS — N132 Hydronephrosis with renal and ureteral calculous obstruction: Secondary | ICD-10-CM | POA: Diagnosis not present

## 2022-01-20 DIAGNOSIS — N200 Calculus of kidney: Secondary | ICD-10-CM

## 2022-01-20 LAB — URINALYSIS, ROUTINE W REFLEX MICROSCOPIC
Bilirubin Urine: NEGATIVE
Glucose, UA: NEGATIVE mg/dL
Ketones, ur: 15 mg/dL — AB
Leukocytes,Ua: NEGATIVE
Nitrite: NEGATIVE
Protein, ur: NEGATIVE mg/dL
Specific Gravity, Urine: 1.01 (ref 1.005–1.030)
pH: 7 (ref 5.0–8.0)

## 2022-01-20 LAB — COMPREHENSIVE METABOLIC PANEL
ALT: 76 U/L — ABNORMAL HIGH (ref 0–44)
AST: 66 U/L — ABNORMAL HIGH (ref 15–41)
Albumin: 4 g/dL (ref 3.5–5.0)
Alkaline Phosphatase: 60 U/L (ref 38–126)
Anion gap: 12 (ref 5–15)
BUN: 8 mg/dL (ref 6–20)
CO2: 22 mmol/L (ref 22–32)
Calcium: 8.8 mg/dL — ABNORMAL LOW (ref 8.9–10.3)
Chloride: 104 mmol/L (ref 98–111)
Creatinine, Ser: 0.75 mg/dL (ref 0.44–1.00)
GFR, Estimated: >60 mL/min (ref >60)
Glucose, Bld: 123 mg/dL — ABNORMAL HIGH (ref 70–99)
Potassium: 3.7 mmol/L (ref 3.5–5.1)
Sodium: 138 mmol/L (ref 135–145)
Total Bilirubin: 0.8 mg/dL (ref 0.3–1.2)
Total Protein: 7.4 g/dL (ref 6.5–8.1)

## 2022-01-20 LAB — WET PREP, GENITAL
Clue Cells Wet Prep HPF POC: NONE SEEN
Sperm: NONE SEEN
Trich, Wet Prep: NONE SEEN
WBC, Wet Prep HPF POC: <10 (ref <10)
Yeast Wet Prep HPF POC: NONE SEEN

## 2022-01-20 LAB — CBC WITH DIFFERENTIAL/PLATELET
Abs Immature Granulocytes: 0.03 10*3/uL (ref 0.00–0.07)
Basophils Absolute: 0.1 10*3/uL (ref 0.0–0.1)
Basophils Relative: 1 %
Eosinophils Absolute: 0.1 10*3/uL (ref 0.0–0.5)
Eosinophils Relative: 1 %
HCT: 40.4 % (ref 36.0–46.0)
Hemoglobin: 14.1 g/dL (ref 12.0–15.0)
Immature Granulocytes: 0 %
Lymphocytes Relative: 23 %
Lymphs Abs: 2.4 10*3/uL (ref 0.7–4.0)
MCH: 32.7 pg (ref 26.0–34.0)
MCHC: 34.9 g/dL (ref 30.0–36.0)
MCV: 93.7 fL (ref 80.0–100.0)
Monocytes Absolute: 0.8 10*3/uL (ref 0.1–1.0)
Monocytes Relative: 8 %
Neutro Abs: 6.9 10*3/uL (ref 1.7–7.7)
Neutrophils Relative %: 67 %
Platelets: 289 10*3/uL (ref 150–400)
RBC: 4.31 MIL/uL (ref 3.87–5.11)
RDW: 14.2 % (ref 11.5–15.5)
WBC: 10.2 10*3/uL (ref 4.0–10.5)
nRBC: 0 % (ref 0.0–0.2)

## 2022-01-20 LAB — URINALYSIS, MICROSCOPIC (REFLEX)

## 2022-01-20 LAB — LACTIC ACID, PLASMA: Lactic Acid, Venous: 2.1 mmol/L (ref 0.5–1.9)

## 2022-01-20 LAB — PREGNANCY, URINE: Preg Test, Ur: NEGATIVE

## 2022-01-20 LAB — LIPASE, BLOOD: Lipase: 26 U/L (ref 11–51)

## 2022-01-20 MED ORDER — TAMSULOSIN HCL 0.4 MG PO CAPS: 0.4000 mg | ORAL_CAPSULE | Freq: Every day | ORAL | 0 refills | Status: DC | PRN

## 2022-01-20 MED ORDER — OXYCODONE HCL 5 MG PO CAPS: 5.0000 mg | ORAL_CAPSULE | ORAL | 0 refills | Status: AC | PRN

## 2022-01-20 MED ORDER — SODIUM CHLORIDE 0.9 % IV BOLUS
500.0000 mL | Freq: Once | INTRAVENOUS | Status: AC
  Administered 2022-01-20: 500 mL via INTRAVENOUS

## 2022-01-20 MED ORDER — HYDROMORPHONE HCL 1 MG/ML IJ SOLN
1.0000 mg | Freq: Once | INTRAMUSCULAR | Status: AC
  Administered 2022-01-20: 1 mg via INTRAVENOUS
  Filled 2022-01-20: qty 1

## 2022-01-20 MED ORDER — ONDANSETRON HCL 4 MG/2ML IJ SOLN
4.0000 mg | Freq: Once | INTRAMUSCULAR | Status: AC
  Administered 2022-01-20: 4 mg via INTRAVENOUS
  Filled 2022-01-20: qty 2

## 2022-01-20 MED ORDER — ONDANSETRON HCL 4 MG PO TABS: 4.0000 mg | ORAL_TABLET | Freq: Three times a day (TID) | ORAL | 0 refills | Status: AC | PRN

## 2022-01-20 NOTE — ED Notes (Signed)
Pt is aware that urine is needed, will try to ask pt later for a urine sample. ? ?

## 2022-01-20 NOTE — ED Triage Notes (Signed)
Pt arrives pov, slow gait to triage with c/o lower pelvic pain x 1 month and RLQ pain radiating to right flank today ?

## 2022-01-20 NOTE — ED Notes (Signed)
Dr aware of latic  2.1 ?

## 2022-01-20 NOTE — ED Notes (Signed)
ED Provider at bedside. 

## 2022-01-20 NOTE — ED Notes (Signed)
Pt attempted to get urine but only gave a few drops ?

## 2022-01-20 NOTE — Discharge Instructions (Signed)
Your history, exam, work-up today revealed a right-sided kidney stone likely causing your symptoms.  The urine did not show convincing evidence of infection and the stone symptoms have improved after medications.  We feel you are safe for discharge home at this time.  Your vital signs are reassuring.  Please rest and stay hydrated and use the pain medicine, nausea medicine, and Flomax if your symptoms persist.  Please follow-up with urology.  If any symptoms change or worsen acutely, please return to the nearest emergency department. ?

## 2022-01-20 NOTE — ED Notes (Signed)
MSE waiver explained. Pt verbalizes understanding. Signature pad not functioning  ?

## 2022-01-20 NOTE — ED Provider Notes (Signed)
?[facility] EMERGENCY DEPARTMENT ?Provider Note ? ? ?CSN: [id number] ?Arrival date & time: 01/20/22  1640 ? ?  ? ?History ? ?Chief Complaint  ?Patient presents with  ? Abdominal Pain  ? ? ?Sarah Mcclure is a 36 y.o. female. ? ?The history is provided by the patient and medical records.  ?Abdominal Pain ?Pain location:  RLQ and R flank ?Pain quality: aching and sharp   ?Pain radiates to:  R flank and back ?Pain severity:  Severe ?Onset quality:  Gradual ?Duration:  4 weeks (evolved today) ?Timing:  Constant ?Progression:  Waxing and waning ?Chronicity:  New ?Context: not recent illness and not trauma   ?Relieved by:  Nothing ?Worsened by:  Nothing ?Ineffective treatments:  None tried ?Associated symptoms: nausea   ?Associated symptoms: no chest pain, no chills, no constipation, no cough, no diarrhea, no dysuria, no fatigue, no fever, no flatus, no shortness of breath, no vaginal bleeding, no vaginal discharge and no vomiting   ? ?  ? ?Home Medications ?Prior to Admission medications   ?Medication Sig Start Date End Date Taking? Authorizing Provider  ?acetaminophen (TYLENOL) 325 MG tablet Take 650 mg by mouth every 6 (six) hours as needed for mild pain.    [provider]  ?ARIPiprazole (ABILIFY) 20 MG tablet Take 1 tablet (20 mg total) by mouth daily. For mood control ?Patient not taking: Reported on 02/16/2018 01/08/18   Lindell Spar I, NP  ?escitalopram (LEXAPRO) 20 MG tablet Take 20 mg by mouth daily.    [provider]  ?ibuprofen (ADVIL) 200 MG tablet Take 800 mg by mouth every 6 (six) hours as needed for moderate pain.    [provider]  ?lurasidone (LATUDA) 40 MG TABS tablet Take 40 mg by mouth daily with breakfast.    [provider]  ?nicotine polacrilex (NICORETTE) 2 MG gum Take 1 each (2 mg total) by mouth as needed for smoking cessation. (May purchase from over the counter): For smoking cessation ?Patient not taking: Reported on 02/16/2018 01/07/18   Encarnacion Slates,  NP  ?Oxcarbazepine (TRILEPTAL) 300 MG tablet Take 1 tablet (300 mg total) by mouth 2 (two) times daily. For mood stabilization ?Patient taking differently: Take 300 mg by mouth daily. For mood stabilization 01/07/18   Encarnacion Slates, NP  ?   ? ?Allergies    ?Amoxicillin and Penicillins   ? ?Review of Systems   ?Review of Systems  ?Constitutional:  Negative for chills, fatigue and fever.  ?HENT:  Negative for congestion.   ?Respiratory:  Negative for cough, chest tightness, shortness of breath and wheezing.   ?Cardiovascular:  Negative for chest pain, palpitations and leg swelling.  ?Gastrointestinal:  Positive for abdominal pain and nausea. Negative for abdominal distention, constipation, diarrhea, flatus and vomiting.  ?Genitourinary:  Positive for flank pain and frequency. Negative for dysuria, vaginal bleeding and vaginal discharge.  ?Musculoskeletal:  Positive for back pain. Negative for neck pain and neck stiffness.  ?Skin:  Negative for rash and wound.  ?Neurological:  Negative for dizziness, light-headedness, numbness and headaches.  ?Psychiatric/Behavioral:  Negative for agitation and confusion.   ? ?Physical Exam ?Updated Vital Signs ?BP (!) 152/100   Pulse 64   Resp (!) 30   Ht 5\' 1"  (1.549 m)   LMP 12/21/2021   SpO2 100%   BMI 22.30 kg/m?  ?Physical Exam ?Vitals and nursing note reviewed. Exam conducted with a chaperone present.  ?Constitutional:   ?   General: She is not in  acute distress. ?   Appearance: She is well-developed.  ?HENT:  ?   Head: Normocephalic and atraumatic.  ?Eyes:  ?   Conjunctiva/sclera: Conjunctivae normal.  ?Cardiovascular:  ?   Rate and Rhythm: Normal rate and regular rhythm.  ?   Heart sounds: No murmur heard. ?Pulmonary:  ?   Effort: Pulmonary effort is normal. No respiratory distress.  ?   Breath sounds: Normal breath sounds.  ?Abdominal:  ?   General: Abdomen is flat. Bowel sounds are normal. There is no distension.  ?   Palpations: Abdomen is soft.  ?   Tenderness: There  is abdominal tenderness in the right lower quadrant. There is right CVA tenderness. There is no left CVA tenderness.  ?Genitourinary: ?   Vagina: No signs of injury. No vaginal discharge, tenderness or bleeding.  ?   Cervix: No cervical motion tenderness.  ?   Uterus: Not tender.   ?   Adnexa:     ?   Right: No tenderness.      ?   Left: No tenderness.    ?Musculoskeletal:     ?   General: No swelling.  ?   Cervical back: Neck supple. No tenderness.  ?Skin: ?   General: Skin is warm and dry.  ?   Capillary Refill: Capillary refill takes less than 2 seconds.  ?   Findings: No erythema or rash.  ?Neurological:  ?   General: No focal deficit present.  ?   Mental Status: She is alert.  ?Psychiatric:     ?   Mood and Affect: Mood normal.  ? ? ?ED Results / Procedures / Treatments   ?Labs ?(all labs ordered are listed, but only abnormal results are displayed) ?Labs Reviewed  ?URINALYSIS, ROUTINE W REFLEX MICROSCOPIC - Abnormal; Notable for the following components:  ?    Result Value  ? Hgb urine dipstick MODERATE (*)   ? Ketones, ur 15 (*)   ? All other components within normal limits  ?COMPREHENSIVE METABOLIC PANEL - Abnormal; Notable for the following components:  ? Glucose, Bld 123 (*)   ? Calcium 8.8 (*)   ? AST 66 (*)   ? ALT 76 (*)   ? All other components within normal limits  ?LACTIC ACID, PLASMA - Abnormal; Notable for the following components:  ? Lactic Acid, Venous 2.1 (*)   ? All other components within normal limits  ?URINALYSIS, MICROSCOPIC (REFLEX) - Abnormal; Notable for the following components:  ? Bacteria, UA RARE (*)   ? All other components within normal limits  ?WET PREP, GENITAL  ?URINE CULTURE  ?PREGNANCY, URINE  ?CBC WITH DIFFERENTIAL/PLATELET  ?LIPASE, BLOOD  ?LACTIC ACID, PLASMA  ?GC/CHLAMYDIA PROBE AMP () NOT AT [facility]  ? ? ?EKG ?None ? ?Radiology ?CT Renal Stone Study ? ?Result Date: 01/20/2022 ?CLINICAL DATA:  Flank pain, kidney stone suspected. Severe right flank pain with nausea and  vomiting. EXAM: CT ABDOMEN AND PELVIS WITHOUT CONTRAST TECHNIQUE: Multidetector CT imaging of the abdomen and pelvis was performed following the standard protocol without IV contrast. RADIATION DOSE REDUCTION: This exam was performed according to the departmental dose-optimization program which includes automated exposure control, adjustment of the mA and/or kV according to patient size and/or use of iterative reconstruction technique. COMPARISON:  Right upper quadrant abdominal ultrasound 12/13/2017. FINDINGS: Lower chest: Clear lung bases. No significant pleural or pericardial effusion. Hepatobiliary: There is heterogeneous hepatic steatosis without focally suspicious liver lesion on noncontrast imaging. No evidence of gallstones, gallbladder  wall thickening or biliary dilatation. Pancreas: Unremarkable. No pancreatic ductal dilatation or surrounding inflammatory changes. Spleen: Normal in size without focal abnormality. Adrenals/Urinary Tract: Both adrenal glands appear normal. There is moderate right-sided hydronephrosis and hydroureter secondary to an obstructing 5 mm calculus at the right ureterovesical junction (image 73/2). This may be faintly visible on the scout image, although is partially obscured by overlap with the distal sacrum. No other urinary tract calculi are seen. The left kidney and bladder appear unremarkable. Stomach/Bowel: No enteric contrast administered. The stomach appears unremarkable for its degree of distension. No evidence of bowel wall thickening, distention or surrounding inflammatory change. The appendix appears normal. Vascular/Lymphatic: There are no enlarged abdominal or pelvic lymph nodes. No significant vascular findings on noncontrast imaging. Reproductive: The uterus and ovaries appear unremarkable. No adnexal mass. Other: No evidence of abdominal wall mass or hernia. No ascites. Musculoskeletal: No acute or significant osseous findings. IMPRESSION: 1. Obstructing 5 mm  calculus at the right ureterovesical junction with moderate right-sided hydronephrosis. 2. No other urinary tract calculi or acute findings identified. 3. Heterogeneous hepatic steatosis. Electronically Signed

## 2022-01-21 LAB — GC/CHLAMYDIA PROBE AMP (~~LOC~~) NOT AT ARMC
Chlamydia: NEGATIVE
Comment: NEGATIVE
Comment: NORMAL
Neisseria Gonorrhea: NEGATIVE

## 2022-01-22 LAB — URINE CULTURE

## 2022-12-12 ENCOUNTER — Ambulatory Visit (INDEPENDENT_AMBULATORY_CARE_PROVIDER_SITE_OTHER): Payer: Medicaid Other | Admitting: Physician Assistant

## 2022-12-12 ENCOUNTER — Ambulatory Visit: Payer: Self-pay

## 2022-12-12 ENCOUNTER — Encounter: Payer: Self-pay | Admitting: Physician Assistant

## 2022-12-12 DIAGNOSIS — M25552 Pain in left hip: Secondary | ICD-10-CM

## 2022-12-12 DIAGNOSIS — M2550 Pain in unspecified joint: Secondary | ICD-10-CM

## 2022-12-12 MED ORDER — METHYLPREDNISOLONE 4 MG PO TBPK: ORAL_TABLET | ORAL | 0 refills | Status: DC

## 2022-12-12 NOTE — Progress Notes (Signed)
Office Visit Note   Patient: Sarah Mcclure           Date of Birth: Mar 20, 1986           MRN: [id number] Visit Date: 12/12/2022              Requested by: No referring provider defined for this encounter. PCP: System, Provider Not In   Assessment & Plan: Visit Diagnoses:  1. Pain in left hip   2. Polyarthralgia     Plan: Sarah Mcclure is a pleasant 37 year old woman with a 3-week history of left hip pain.  She does not remember any particular incident but started happening after she been bowling.  She denies any locking or catching.  She said that she had polyarthralgia in all of her joints but most of them have resolved with the exception of her left hip.  She does have a family history of lupus in her father.  Her mom has some type of arthritis but she is unsure of what type.  She does not recall that she has ever had screening test before.  She has been told she has fibromyalgia but does not take any medication for it she does have a hip history of trochanteric bursitis but I do not feel that this coordinates with her symptoms today.  She does have pain with manipulation of her hip reproducible in her groin especially with external rotation frog-leg.  I will place her on a Medrol Dosepak.  Also will get inflammatory labs.  She will follow-up with me in 2 weeks.  If she had any concerns would recommend referral to rheumatologist  Pain Assessment  Average Pain: 7 Current Pain: 7 Aggravating Factors: Rotating her hip outward weightbearing sleeping on her stomach Alleviating Factors: Rest  Follow-Up Instructions: Return in about 2 weeks (around 12/26/2022).   Orders:  Orders Placed This Encounter  Procedures   XR HIP UNILAT W OR W/O PELVIS 2-3 VIEWS LEFT   Meds ordered this encounter  Medications   methylPREDNISolone (MEDROL DOSEPAK) 4 MG TBPK tablet    Sig: Take as directed with food    Dispense:  21 tablet    Refill:  0      Procedures: No procedures performed   Clinical  Data: No additional findings.   Subjective: Chief Complaint  Patient presents with   Left Hip - Pain    HPI pleasant 37 year old woman with a 3-week history of left hip pain.  Denies any fever chills or recent illness.  She did go bowling a few weeks ago but unsure if this was related.  She also reports a history of polyarthralgia.  She has a history of fibromyalgia as well though she does not take any medication for it.  Family history is positive for lupus in her dad and her mom has some type of arthritis but she is unsure what type  Review of Systems  All other systems reviewed and are negative.    Objective: Vital Signs: There were no vitals taken for this visit.  Physical Exam Constitutional:      Appearance: Normal appearance.  Skin:    General: Skin is warm and dry.  Neurological:     Mental Status: She is alert.  Psychiatric:        Mood and Affect: Mood normal.     Ortho Exam Examination of her hip she has mild tenderness over the trochanteric bursa.  Her compartments are soft and nontender she has baseline radicular findings of  numbing and tingling but she says this is separate.  She does have a known back issue.  She has good strength with dorsiflexion plantarflexion extension and flexion.  She does have pain with in her hip joint with internal/external logrolling of her hip.  She is especially tender and painful with external rotation frog-leg Specialty Comments:  No specialty comments available.  Imaging: No results found.   PMFS History: Patient Active Problem List   Diagnosis Date Noted   Polyarthralgia 12/12/2022   Pain in left hip 12/12/2022   Bipolar 1 disorder ([place]) 03/06/2019   ETOH abuse 03/06/2019   Pain in right foot 04/20/2018   Bipolar affective disorder, manic, severe, with psychotic behavior ([place]) 01/01/2018   Bipolar affective disorder, currently manic, moderate ([place]) 12/31/2017   Psychosis ([place])    Bipolar affective disorder, mixed,  severe ([place]) 12/27/2017   Bipolar affective disorder ([place]) 12/16/2017   Polysubstance (including opioids) dependence, daily use ([place]) 12/15/2017   Breast lump on left side at 6 o'clock position 06/29/2013   Breast lump on right side at 3 o'clock position 06/29/2013   Cat allergies 05/25/2012   Family history of systemic lupus erythematosus 04/13/2012   Back pain 04/13/2012   Pleuritic chest pain 04/13/2012   Shin splints 04/13/2012   Past Medical History:  Diagnosis Date   Allergy    Asthma    As a child   Bipolar 1 disorder ([place])    Chronic back pain greater than 3 months duration 2013   Depression    Fibromyalgia     Family History  Problem Relation Age of Onset   Alcohol abuse Mother    Arthritis Mother    Lupus Father    Alcohol abuse Father    Heart disease Father    Depression Father    Heart disease Maternal Grandfather     History reviewed. No pertinent surgical history. Social History   Occupational History   Not on file  Tobacco Use   Smoking status: Every Day    Packs/day: 1.00    Types: Cigarettes   Smokeless tobacco: Never  Substance and Sexual Activity   Alcohol use: Yes    Alcohol/week: 56.0 standard drinks of alcohol    Types: 56 Shots of liquor per week    Comment: daily   Drug use: Yes    Comment: gummies thc   Sexual activity: Yes    Birth control/protection: Condom

## 2022-12-18 ENCOUNTER — Telehealth: Payer: Self-pay | Admitting: Physician Assistant

## 2022-12-18 LAB — ANAEROBIC AND AEROBIC CULTURE

## 2022-12-18 LAB — RHEUMATOID FACTOR: Rheumatoid fact SerPl-aCnc: <14 IU/mL (ref <14)

## 2022-12-18 LAB — SEDIMENTATION RATE: Sed Rate: 2 mm/h (ref 0–20)

## 2022-12-18 LAB — URIC ACID: Uric Acid, Serum: 3.4 mg/dL (ref 2.5–7.0)

## 2022-12-18 NOTE — Telephone Encounter (Signed)
Pt called requesting a call back from PA Persons. Pt states last office visit PA Persons sent in medication for her pains and she states it is not helping and would like to discuss an alternative until next OV. Please call pt at [phone]

## 2022-12-19 ENCOUNTER — Other Ambulatory Visit: Payer: Self-pay | Admitting: Physician Assistant

## 2022-12-19 MED ORDER — MELOXICAM 15 MG PO TABS: 15.0000 mg | ORAL_TABLET | Freq: Every day | ORAL | 0 refills | Status: DC

## 2022-12-26 ENCOUNTER — Encounter: Payer: Self-pay | Admitting: Physician Assistant

## 2022-12-26 ENCOUNTER — Ambulatory Visit (INDEPENDENT_AMBULATORY_CARE_PROVIDER_SITE_OTHER): Payer: Medicaid Other | Admitting: Physician Assistant

## 2022-12-26 DIAGNOSIS — M25552 Pain in left hip: Secondary | ICD-10-CM | POA: Diagnosis not present

## 2022-12-26 NOTE — Progress Notes (Signed)
Office Visit Note   Patient: Sarah Mcclure           Date of Birth: [birth year]           MRN: [id number] Visit Date: 12/26/2022              Requested by: No referring provider defined for this encounter. PCP: System, Provider Not In  No chief complaint on file.     HPI: Sarah Mcclure comes in today for follow-up on her left hip pain.  She did take steroid which did not seem to help her much.  I recently switched her to meloxicam and she does feel like that is helpful.  She also gets some just general achiness in her shoulders and her back and her chest and her thighs.  Assessment & Plan: Visit Diagnoses:  1. Pain in left hip     Plan: Her screening inflammatory labs were negative.  Her and I had a long discussion.  I am fine to continue prescribing Mobic as long as she gets her kidney function.she checked with a primary care provider periodically.  She has a sister who has difficulties with wheat and has stopped taking wheat we talked about food as causes for inflammation certainly this would be something she could try for herself.  Ultimately if she continued to have problems I can refer her to rheumatology  Follow-Up Instructions: No follow-ups on file.   Ortho Exam  Patient is alert, oriented, no adenopathy, well-dressed, normal affect, normal respiratory effort. Examination demonstrates good fluid motion with range of hip good strength no paresthesias cannot reproduce any pain today  Imaging: No results found. No images are attached to the encounter.  Labs: Lab Results  Component Value Date   HGBA1C 5.0 12/17/2017   ESRSEDRATE 2 12/12/2022   ESRSEDRATE 1 04/13/2012   LABURIC 3.4 12/12/2022   REPTSTATUS 01/22/2022 FINAL 01/20/2022   CULT MULTIPLE SPECIES PRESENT, SUGGEST RECOLLECTION (A) 01/20/2022     Lab Results  Component Value Date   ALBUMIN 4.0 01/20/2022   ALBUMIN 3.8 03/06/2019   ALBUMIN 4.1 02/16/2018    No results found for: "MG" No results found for:  "VD25OH"  No results found for: "PREALBUMIN"    Latest Ref Rng & Units 01/20/2022    5:37 PM 03/06/2019    1:00 AM 02/16/2018   11:19 AM  CBC EXTENDED  WBC 4.0 - 10.5 K/uL 10.2  9.6  10.5   RBC 3.87 - 5.11 MIL/uL 4.31  4.50  4.34   Hemoglobin 12.0 - 15.0 g/dL 14.1  14.1  13.1   HCT 36.0 - 46.0 % 40.4  41.5  38.8   Platelets 150 - 400 K/uL 289  261  300   NEUT# 1.7 - 7.7 K/uL 6.9     Lymph# 0.7 - 4.0 K/uL 2.4        There is no height or weight on file to calculate BMI.  Orders:  No orders of the defined types were placed in this encounter.  No orders of the defined types were placed in this encounter.    Procedures: No procedures performed  Clinical Data: No additional findings.  ROS:  All other systems negative, except as noted in the HPI. Review of Systems  All other systems reviewed and are negative.   Objective: Vital Signs: There were no vitals taken for this visit.  Specialty Comments:  No specialty comments available.  PMFS History: Patient Active Problem List   Diagnosis Date Noted  Polyarthralgia 12/12/2022   Pain in left hip 12/12/2022   Bipolar 1 disorder ([place]) 03/06/2019   ETOH abuse 03/06/2019   Pain in right foot 04/20/2018   Bipolar affective disorder, manic, severe, with psychotic behavior ([place]) 01/01/2018   Bipolar affective disorder, currently manic, moderate ([place]) 12/31/2017   Psychosis ([place])    Bipolar affective disorder, mixed, severe ([place]) 12/27/2017   Bipolar affective disorder ([place]) 12/16/2017   Polysubstance (including opioids) dependence, daily use ([place]) 12/15/2017   Breast lump on left side at 6 o'clock position 06/29/2013   Breast lump on right side at 3 o'clock position 06/29/2013   Cat allergies 05/25/2012   Family history of systemic lupus erythematosus 04/13/2012   Back pain 04/13/2012   Pleuritic chest pain 04/13/2012   Shin splints 04/13/2012   Past Medical History:  Diagnosis Date   Allergy    Asthma    As a child    Bipolar 1 disorder ([place])    Chronic back pain greater than 3 months duration 2013   Depression    Fibromyalgia     Family History  Problem Relation Age of Onset   Alcohol abuse Mother    Arthritis Mother    Lupus Father    Alcohol abuse Father    Heart disease Father    Depression Father    Heart disease Maternal Grandfather     History reviewed. No pertinent surgical history. Social History   Occupational History   Not on file  Tobacco Use   Smoking status: Every Day    Packs/day: 1    Types: Cigarettes   Smokeless tobacco: Never  Substance and Sexual Activity   Alcohol use: Yes    Alcohol/week: 56.0 standard drinks of alcohol    Types: 56 Shots of liquor per week    Comment: daily   Drug use: Yes    Comment: gummies thc   Sexual activity: Yes    Birth control/protection: Condom

## 2023-01-13 ENCOUNTER — Other Ambulatory Visit: Payer: Self-pay | Admitting: Physician Assistant

## 2023-02-18 ENCOUNTER — Other Ambulatory Visit: Payer: Self-pay | Admitting: Physician Assistant

## 2023-03-29 ENCOUNTER — Encounter (HOSPITAL_BASED_OUTPATIENT_CLINIC_OR_DEPARTMENT_OTHER): Payer: Self-pay

## 2023-03-29 ENCOUNTER — Emergency Department (HOSPITAL_BASED_OUTPATIENT_CLINIC_OR_DEPARTMENT_OTHER)
Admission: EM | Admit: 2023-03-29 | Discharge: 2023-03-29 | Disposition: A | Discharge status: Home / Self Care | Payer: Medicaid Other | Attending: Emergency Medicine | Admitting: Emergency Medicine

## 2023-03-29 ENCOUNTER — Emergency Department (HOSPITAL_BASED_OUTPATIENT_CLINIC_OR_DEPARTMENT_OTHER): Payer: Medicaid Other

## 2023-03-29 DIAGNOSIS — W540XXA Bitten by dog, initial encounter: Secondary | ICD-10-CM | POA: Insufficient documentation

## 2023-03-29 DIAGNOSIS — S61451A Open bite of right hand, initial encounter: Secondary | ICD-10-CM | POA: Diagnosis not present

## 2023-03-29 DIAGNOSIS — Z23 Encounter for immunization: Secondary | ICD-10-CM | POA: Insufficient documentation

## 2023-03-29 MED ORDER — BACITRACIN ZINC 500 UNIT/GM EX OINT
TOPICAL_OINTMENT | Freq: Two times a day (BID) | CUTANEOUS | Status: DC
  Filled 2023-03-29: qty 28.35

## 2023-03-29 MED ORDER — SULFAMETHOXAZOLE-TRIMETHOPRIM 800-160 MG PO TABS: 1.0000 | ORAL_TABLET | Freq: Two times a day (BID) | ORAL | 0 refills | Status: AC

## 2023-03-29 MED ORDER — CLINDAMYCIN HCL 150 MG PO CAPS: 300.0000 mg | ORAL_CAPSULE | Freq: Four times a day (QID) | ORAL | 0 refills | Status: AC

## 2023-03-29 MED ORDER — TETANUS-DIPHTH-ACELL PERTUSSIS 5-2.5-18.5 LF-MCG/0.5 IM SUSY
0.5000 mL | PREFILLED_SYRINGE | Freq: Once | INTRAMUSCULAR | Status: AC
  Administered 2023-03-29: 0.5 mL via INTRAMUSCULAR
  Filled 2023-03-29: qty 0.5

## 2023-03-29 NOTE — ED Triage Notes (Signed)
Dog bit on right hand between 4th and fifth finger.  Onset last night.  Noted lac between fingers.

## 2023-03-29 NOTE — ED Notes (Signed)
Patient states dog is her own and is up to date with vaccinations

## 2023-03-29 NOTE — ED Notes (Signed)
Dog bite irrigated.  Bacterin applied Drsg. With guaze.

## 2023-03-29 NOTE — ED Provider Notes (Signed)
[place] EMERGENCY DEPARTMENT AT [facility] Provider Note   CSN: [id number] Arrival date & time: 03/29/23  1530     History  Chief Complaint  Patient presents with   Animal Bite    Sarah Mcclure is a 37 y.o. female presented after a pit mix bit her right hand yesterday.  Patient states at 9 PM last night she went to break up a dog fight between her 2 dogs when her pet mix bit her between her right fourth and fifth digit.  Patient is to clean out the wound but came today because he does not like how it is healing and wants it sutured.  Patient denies any change in sensation/motor skills or skin color changes or discharge.  Patient states that the dog is 76 years old and up-to-date on vaccines.  Patient states she has had the rabies vaccine in the past.  Patient is that she can watch her dog for the next 10 days and call animal control if she sees signs of rabies.  Patient is unsure of last Tdap.  Patient denied trismus, jaw claudication, fever, hand swelling  Home Medications Prior to Admission medications   Medication Sig Start Date End Date Taking? Authorizing Provider  clindamycin (CLEOCIN) 150 MG capsule Take 2 capsules (300 mg total) by mouth every 6 (six) hours for 7 days. 03/29/23 04/05/23 Yes Hurley Sobel, Beverly Gust, PA-C  sulfamethoxazole-trimethoprim (BACTRIM DS) 800-160 MG tablet Take 1 tablet by mouth 2 (two) times daily for 7 days. 03/29/23 04/05/23 Yes Hailyn Zarr, Beverly Gust, PA-C  acetaminophen (TYLENOL) 325 MG tablet Take 650 mg by mouth every 6 (six) hours as needed for mild pain.    [provider]  ARIPiprazole (ABILIFY) 20 MG tablet Take 1 tablet (20 mg total) by mouth daily. For mood control Patient not taking: Reported on 02/16/2018 01/08/18   Armandina Stammer I, NP  escitalopram (LEXAPRO) 20 MG tablet Take 20 mg by mouth daily.    [provider]  ibuprofen (ADVIL) 200 MG tablet Take 800 mg by mouth every 6 (six) hours as needed for moderate pain.    [provider]  lurasidone (LATUDA) 40 MG TABS tablet Take 40 mg by mouth daily with breakfast.    [provider]  meloxicam (MOBIC) 15 MG tablet TAKE 1 TABLET(15 MG) BY MOUTH DAILY 02/19/23   Persons, [place], PA  methylPREDNISolone (MEDROL DOSEPAK) 4 MG TBPK tablet Take as directed with food 12/12/22   Persons, [place], PA  nicotine polacrilex (NICORETTE) 2 MG gum Take 1 each (2 mg total) by mouth as needed for smoking cessation. (May purchase from over the counter): For smoking cessation Patient not taking: Reported on 02/16/2018 01/07/18   Armandina Stammer I, NP  ondansetron (ZOFRAN) 4 MG tablet Take 1 tablet (4 mg total) by mouth every 8 (eight) hours as needed for nausea or vomiting. 01/20/22   Tegeler, Canary Brim, MD  Oxcarbazepine (TRILEPTAL) 300 MG tablet Take 1 tablet (300 mg total) by mouth 2 (two) times daily. For mood stabilization Patient taking differently: Take 300 mg by mouth daily. For mood stabilization 01/07/18   Armandina Stammer I, NP  oxycodone (OXY-IR) 5 MG capsule Take 1 capsule (5 mg total) by mouth every 4 (four) hours as needed. 01/20/22   Tegeler, Canary Brim, MD  tamsulosin (FLOMAX) 0.4 MG CAPS capsule Take 1 capsule (0.4 mg total) by mouth daily as needed. 01/20/22   Tegeler, Canary Brim, MD      Allergies  Amoxicillin and Penicillins    Review of Systems   Review of Systems See HPI Physical Exam Updated Vital Signs BP (!) 152/106 (BP Location: Left Arm)   Pulse 99   Temp 98.5 F (36.9 C) (Oral)   Resp 16   Ht 5' 0.75" (1.543 m)   Wt 86.2 kg   SpO2 98%   BMI 36.20 kg/m  Physical Exam Constitutional:      General: She is not in acute distress. Cardiovascular:     Pulses: Normal pulses.  Musculoskeletal:        General: Normal range of motion.     Comments: No step-off/crepitus/abnormalities palpated No flexor tendon tenderness No signs of flexor tenosynovitis Full active range of motion right wrist/hand/fingers  Skin:    General: Skin is warm  and dry.     Capillary Refill: Capillary refill takes less than 2 seconds.     Findings: No erythema.     Comments: 1 cm laceration between the fourth and fifth digits on the right hand that is closely approximated with no discharge or surrounding skin color changes  Neurological:     Mental Status: She is alert.     Comments: Sensation intact distally     ED Results / Procedures / Treatments   Labs (all labs ordered are listed, but only abnormal results are displayed) Labs Reviewed - No data to display  EKG None  Radiology DG Hand Complete Right  Result Date: 03/29/2023 CLINICAL DATA:  Dog bite EXAM: RIGHT HAND - COMPLETE 3+ VIEW COMPARISON:  None Available. FINDINGS: There is no evidence of fracture or dislocation. There is no evidence of arthropathy or other focal bone abnormality. Soft tissues are unremarkable. No foreign body identified. IMPRESSION: Negative. Electronically Signed   By: Darliss Cheney M.D.   On: 03/29/2023 17:07    Procedures Procedures    Medications Ordered in ED Medications  bacitracin ointment (has no administration in time range)  Tdap (BOOSTRIX) injection 0.5 mL (0.5 mLs Intramuscular Given 03/29/23 1737)    ED Course/ Medical Decision Making/ A&P                             Medical Decision Making Amount and/or Complexity of Data Reviewed Radiology: ordered.  Risk OTC drugs. Prescription drug management.   Soyla Murphy 37 y.o. presented today for dog bite. Working DDx that I considered at this time includes, but not limited to, dog bite, cellulitis, flexor tenosynovitis, rabies, tetanus.  R/o DDx: cellulitis, flexor tenosynovitis, rabies, tetanus: These are considered less likely due to history of present illness and physical exam findings  Review of prior external notes: 01/20/2022 ED  Unique Tests and My Interpretation:  Right hand x-ray: No acute osseous changes or foreign bodies noted  Discussion with Independent Historian:  None  Discussion of Management of Tests: None  Risk: Medium: prescription drug management  Risk Stratification Score: None  Plan: Patient presented for dog bite. On exam patient was in no acute distress and had stable vitals.  Patient's exam did show 1 cm laceration between fourth and fifth digits of right hand that did not show any signs of skin infections or drainage.  Laceration was closely approximated and does not need suture repair.  X-ray was obtained to evaluate for possible foreign body which was ultimately negative.  I spoke to the patient about how we do not sew up the dog bites as this can lead to further  infection and instead will clean out the wound give bacitracin ointments and encouraged patient to keep wound clean and dry and use Neosporin when she changes out the wound.  Patient does have penicillin allergy and so clindamycin and Bactrim will be ordered for her to take as she states she has had these in the past.  I spoke to the patient about how clindamycin can increase her risk for C. difficile and patient verbalized understanding of this risk.  I encouraged patient to watch her dog over the next 10 days to look for signs of rabies and to call animal control if dog does begin to show signs and to return to ER for rabies series if this is the case.  Patient was given return precautions. Patient stable for discharge at this time.  Patient verbalized understanding of plan.         Final Clinical Impression(s) / ED Diagnoses Final diagnoses:  Dog bite, initial encounter    Rx / DC Orders ED Discharge Orders          Ordered    clindamycin (CLEOCIN) 150 MG capsule  Every 6 hours        03/29/23 1737    sulfamethoxazole-trimethoprim (BACTRIM DS) 800-160 MG tablet  2 times daily        03/29/23 1737              Netta Corrigan, PA-C 03/29/23 1741    Tegeler, Canary Brim, MD 03/29/23 1747

## 2023-03-29 NOTE — Discharge Instructions (Signed)
Please pick up the antibiotics I prescribed Breo and take as prescribed.  Please follow-up with your primary care provider recent symptoms and ER visit.  Today your x-ray did not show any foreign bodies or damage to bones.  You may use Neosporin ointments and keep the wound clean and dry and follow-up with your primary care provider in the next week to be reevaluated.  Please watch her dog over the next 10 days to ensure the dog's not show any signs of rabies including increased aggression, foaming at the mouth, or other symptoms.  If dog does begin to show the symptoms call animal control and return to ER for rabies vaccines.

## 2023-10-06 ENCOUNTER — Other Ambulatory Visit: Payer: Self-pay | Admitting: Physician Assistant

## 2023-12-09 ENCOUNTER — Other Ambulatory Visit: Payer: Self-pay | Admitting: Orthopedic Surgery

## 2024-03-07 IMAGING — CT CT RENAL STONE PROTOCOL
2 of 3 series · 16 of 46 positions shown, 18 images · non-contrast
Comparison: Right upper quadrant abdominal ultrasound 12/13/2017.

CLINICAL DATA: Flank pain, kidney stone suspected. Severe right
flank pain with nausea and vomiting.



[Series 2: axial st · axial · 0.90mm/px · z∈[-546,-146]mm · 13 of 92 slices shown, 15 images]
[im 6/92  soft-tissue]
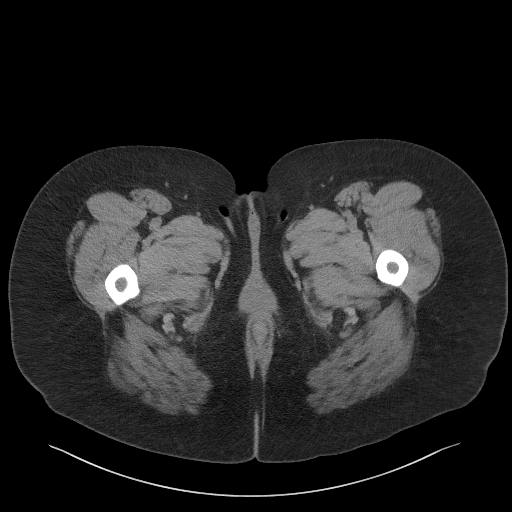
[im 6/92  bone]
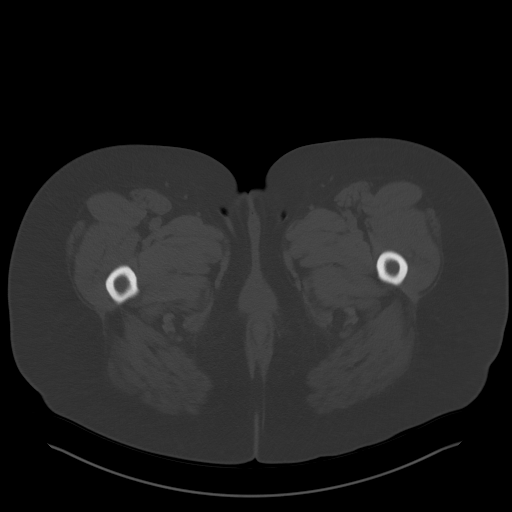
[im 12/92  soft-tissue]
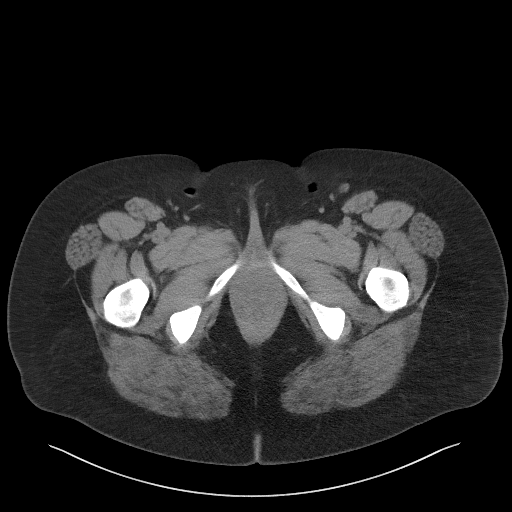
[im 18/92  soft-tissue]
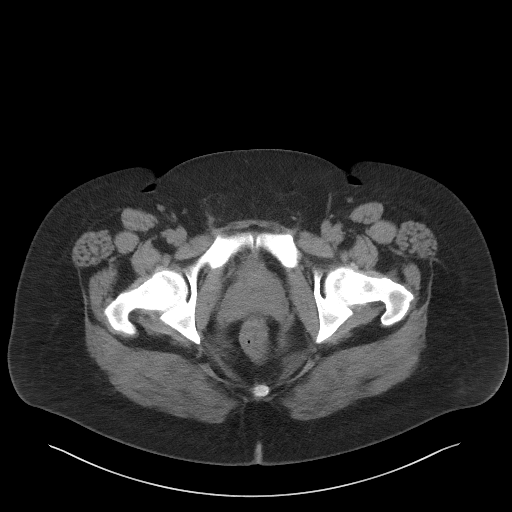
[im 27/92  soft-tissue]
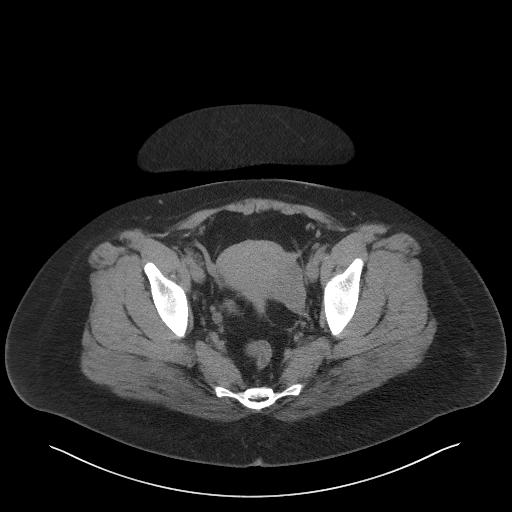
[im 33/92  soft-tissue]
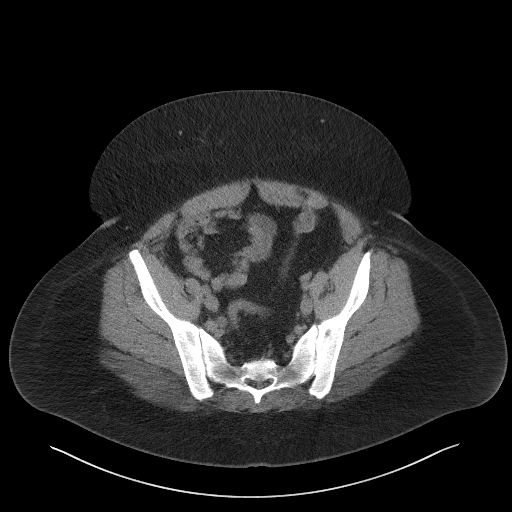
[im 39/92  soft-tissue]
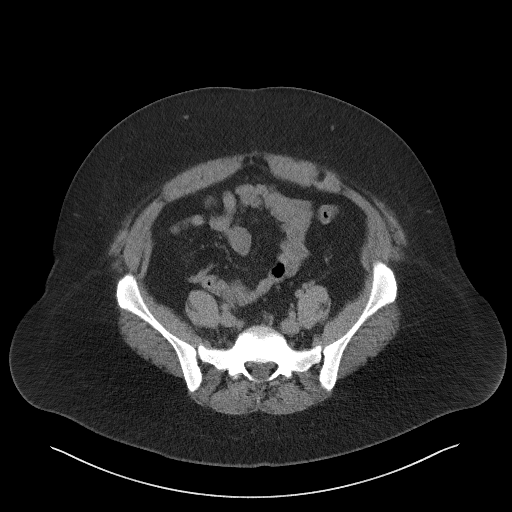
[im 47/92  soft-tissue]
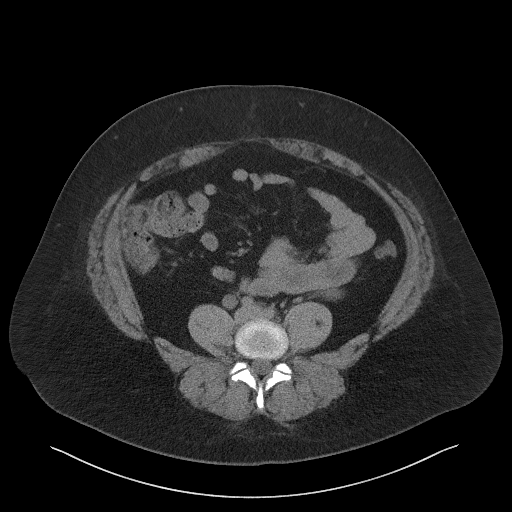
[im 53/92  soft-tissue]
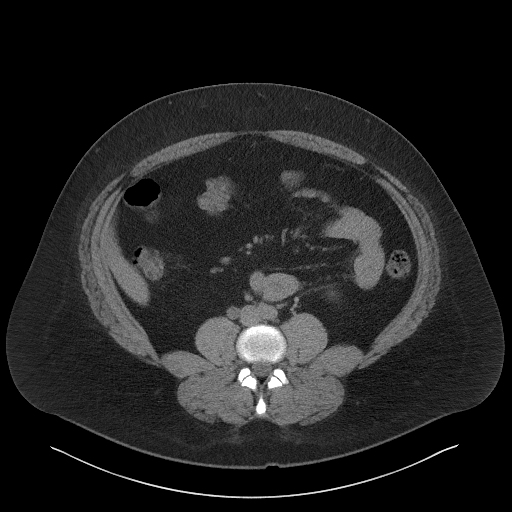
[im 59/92  soft-tissue]
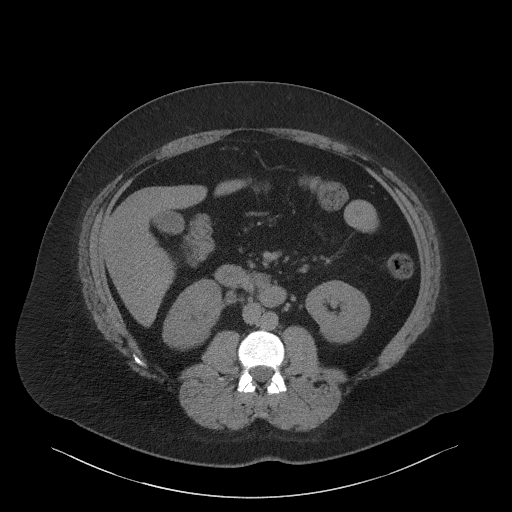
[im 59/92  bone]
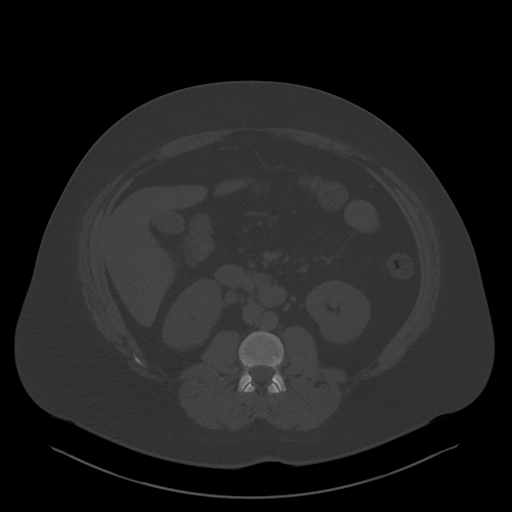
[im 65/92  soft-tissue]
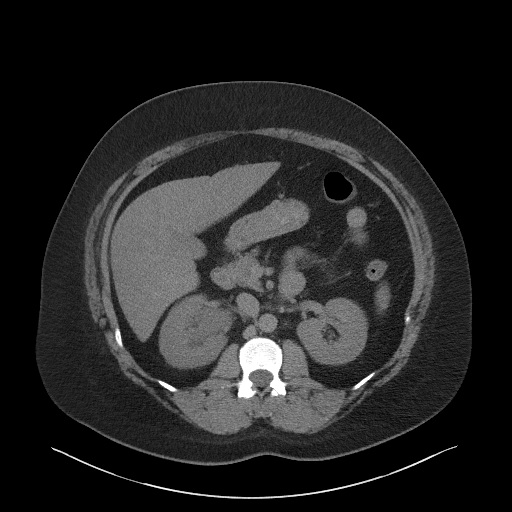
[im 74/92  soft-tissue]
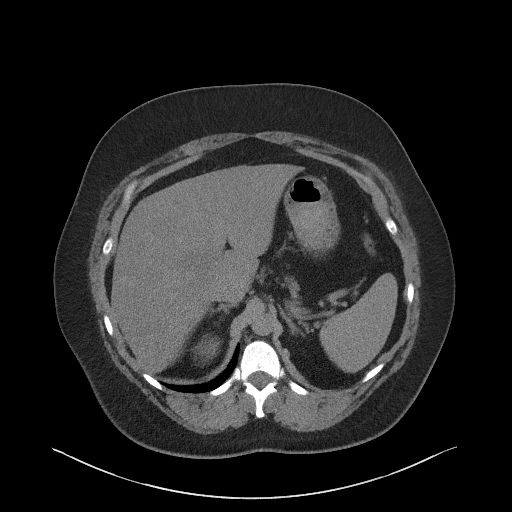
[im 80/92  soft-tissue]
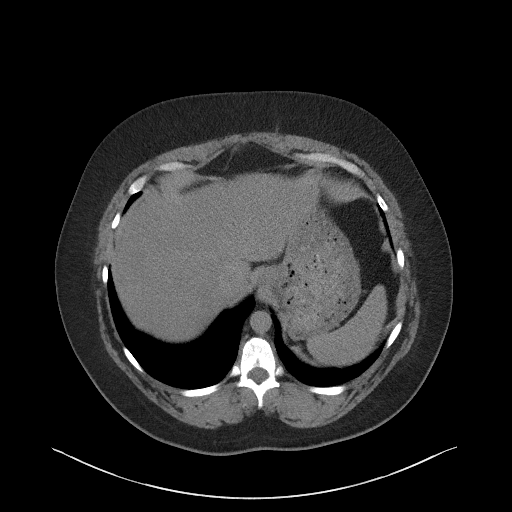
[im 86/92  soft-tissue]
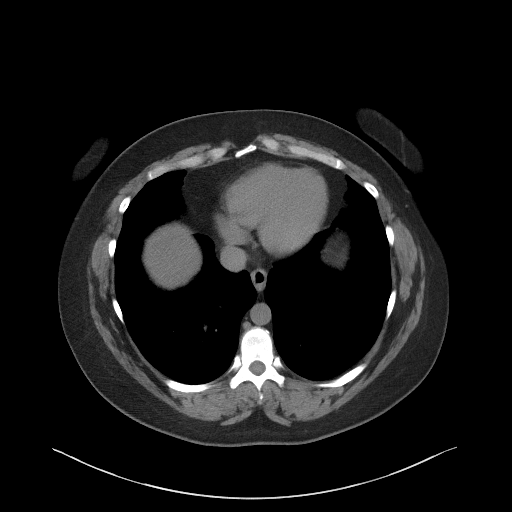

[Series 4: coronal st · coronal · 0.86mm/px · 3 of 119 slices shown]
[im 40/119  soft-tissue]
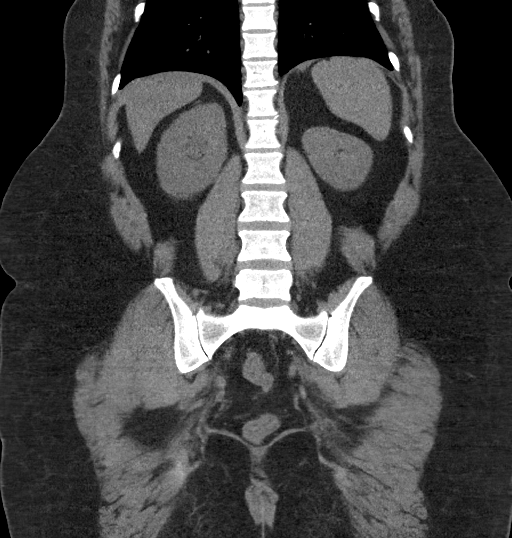
[im 53/119  soft-tissue]
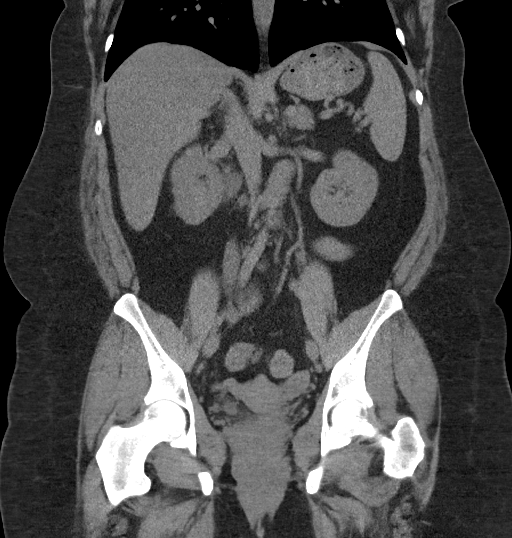
[im 66/119  soft-tissue]
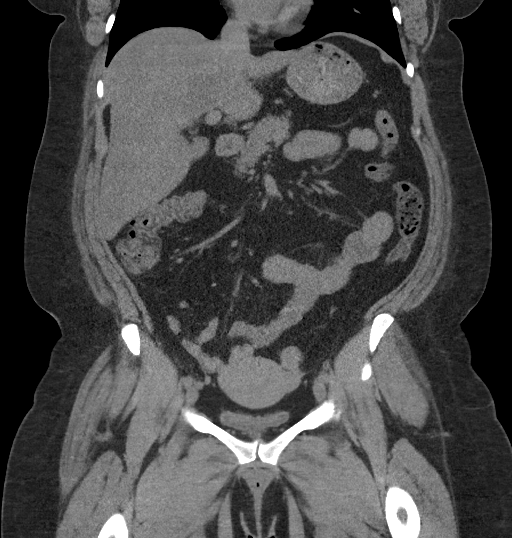

[16 of 46 positions shown; findings below may reference images not displayed]

FINDINGS: Lower chest: Clear lung bases. No significant pleural or pericardial
effusion.

Hepatobiliary: There is heterogeneous hepatic steatosis without
focally suspicious liver lesion on noncontrast imaging. No evidence
of gallstones, gallbladder wall thickening or biliary dilatation.

Pancreas: Unremarkable. No pancreatic ductal dilatation or
surrounding inflammatory changes.

Spleen: Normal in size without focal abnormality.

Adrenals/Urinary Tract: Both adrenal glands appear normal. There is
moderate right-sided hydronephrosis and hydroureter secondary to an
obstructing 5 mm calculus at the right ureterovesical junction
(image 73/2). This may be faintly visible on the scout image,
although is partially obscured by overlap with the distal sacrum. No
other urinary tract calculi are seen. The left kidney and bladder
appear unremarkable.

Stomach/Bowel: No enteric contrast administered. The stomach appears
unremarkable for its degree of distension. No evidence of bowel wall
thickening, distention or surrounding inflammatory change. The
appendix appears normal.

Vascular/Lymphatic: There are no enlarged abdominal or pelvic lymph
nodes. No significant vascular findings on noncontrast imaging.

Reproductive: The uterus and ovaries appear unremarkable. No adnexal
mass.

Other: No evidence of abdominal wall mass or hernia. No ascites.

Musculoskeletal: No acute or significant osseous findings.
IMPRESSION: 1. Obstructing 5 mm calculus at the right ureterovesical junction
with moderate right-sided hydronephrosis.
2. No other urinary tract calculi or acute findings identified.
3. Heterogeneous hepatic steatosis.

## 2024-03-09 ENCOUNTER — Encounter: Payer: Self-pay | Admitting: Physician Assistant

## 2024-03-09 ENCOUNTER — Ambulatory Visit (INDEPENDENT_AMBULATORY_CARE_PROVIDER_SITE_OTHER): Payer: MEDICAID | Admitting: Physician Assistant

## 2024-03-09 ENCOUNTER — Other Ambulatory Visit: Payer: Self-pay | Admitting: Physician Assistant

## 2024-03-09 DIAGNOSIS — M2550 Pain in unspecified joint: Secondary | ICD-10-CM | POA: Diagnosis not present

## 2024-03-09 MED ORDER — MELOXICAM 15 MG PO TABS: 15.0000 mg | ORAL_TABLET | Freq: Every day | ORAL | 2 refills | Status: DC

## 2024-03-09 NOTE — Progress Notes (Signed)
 Office Visit Note   Patient: Sarah Mcclure           Date of Birth: 01-16-1986           MRN: [id number] Visit Date: 03/09/2024              Requested by: No referring provider defined for this encounter. PCP: System, Provider Not In   Assessment & Plan: Visit Diagnoses:  1. Polyarthralgia     Plan: [name] is a pleasant 38 year old woman who I have seen for polyarthralgia about a year ago.  At that time basic inflammatory labs came back without any significant elevation.  She did seem to manage well on meloxicam .  She is requesting refills of meloxicam  today.  She still continues to complain of intermittent swelling in her hands and her hips sometimes.  I will refer her to rheumatology.  Will also refill her meloxicam .  As she does not have a primary care provider we will get a BUN and creatinine just to ensure that she does not have any kidney issues since I am prescribing the meloxicam .  Will also get a vitamin D to see if this is the source of her polyarthralgia  Follow-Up Instructions: Return if symptoms worsen or fail to improve.   Orders:  No orders of the defined types were placed in this encounter.  No orders of the defined types were placed in this encounter.     Procedures: No procedures performed   Clinical Data: No additional findings.   Subjective: No chief complaint on file.   HPI Tarisa is a pleasant 38 year old woman with polyarthralgia.  No family history of inflammatory arthropathies.  She did have some blood work done a year ago did not from a screening aspect show any abnormalities.  She continues to have polyarthralgia that makes it limiting for her to do things.  Most recently has been her hands with swelling stiffness and pain.  Review of Systems  All other systems reviewed and are negative.    Objective: Vital Signs: There were no vitals taken for this visit.  Physical Exam Constitutional:      Appearance: Normal appearance.  Pulmonary:      Effort: Pulmonary effort is normal.     Breath sounds: Normal breath sounds.  Skin:    General: Skin is warm and dry.  Neurological:     Mental Status: She is alert.  Psychiatric:        Mood and Affect: Mood normal.        Behavior: Behavior normal.      Specialty Comments:  No specialty comments available.  Imaging: No results found.   PMFS History: Patient Active Problem List   Diagnosis Date Noted   Polyarthralgia 12/12/2022   Pain in left hip 12/12/2022   Bipolar 1 disorder ([facility]) 03/06/2019   ETOH abuse 03/06/2019   Pain in right foot 04/20/2018   Bipolar affective disorder, manic, severe, with psychotic behavior ([facility]) 01/01/2018   Bipolar affective disorder, currently manic, moderate ([facility]) 12/31/2017   Psychosis ([facility])    Bipolar affective disorder, mixed, severe ([facility]) 12/27/2017   Bipolar affective disorder ([facility]) 12/16/2017   Polysubstance (including opioids) dependence, daily use ([facility]) 12/15/2017   Breast lump on left side at 6 o'clock position 06/29/2013   Breast lump on right side at 3 o'clock position 06/29/2013   Cat allergies 05/25/2012   Family history of systemic lupus erythematosus 04/13/2012   Back pain 04/13/2012   Pleuritic chest pain 04/13/2012  Shin splints 04/13/2012   Past Medical History:  Diagnosis Date   Allergy    Asthma    As a child   Bipolar 1 disorder ([facility])    Chronic back pain greater than 3 months duration 2013   Depression    Fibromyalgia     Family History  Problem Relation Age of Onset   Alcohol abuse Mother    Arthritis Mother    Lupus Father    Alcohol abuse Father    Heart disease Father    Depression Father    Heart disease Maternal Grandfather     History reviewed. No pertinent surgical history. Social History   Occupational History   Not on file  Tobacco Use   Smoking status: Every Day    Current packs/day: 1.00    Types: Cigarettes   Smokeless tobacco: Never  Substance and Sexual Activity   Alcohol  use: Yes    Alcohol/week: 56.0 standard drinks of alcohol    Types: 56 Shots of liquor per week    Comment: daily   Drug use: Not Currently    Comment: gummies thc   Sexual activity: Yes    Birth control/protection: Condom

## 2024-03-10 ENCOUNTER — Other Ambulatory Visit: Payer: Self-pay | Admitting: Physician Assistant

## 2024-03-10 LAB — HOUSE ACCOUNT TRACKING

## 2024-03-10 LAB — BUN: BUN: 6 mg/dL — ABNORMAL LOW (ref 7–25)

## 2024-03-10 LAB — VITAMIN D 25 HYDROXY (VIT D DEFICIENCY, FRACTURES): Vit D, 25-Hydroxy: 19 ng/mL — ABNORMAL LOW (ref 30–100)

## 2024-03-11 ENCOUNTER — Other Ambulatory Visit: Payer: Self-pay | Admitting: Physician Assistant

## 2024-03-11 MED ORDER — VITAMIN D (ERGOCALCIFEROL) 1.25 MG (50000 UNIT) PO CAPS: 50000.0000 [IU] | ORAL_CAPSULE | ORAL | 0 refills | Status: DC

## 2024-03-19 LAB — CREATINE

## 2024-03-25 LAB — SPECIMEN STATUS REPORT

## 2024-04-29 ENCOUNTER — Encounter: Payer: Self-pay | Admitting: Physician Assistant

## 2024-04-29 ENCOUNTER — Other Ambulatory Visit: Payer: Self-pay | Admitting: Physician Assistant

## 2024-04-29 ENCOUNTER — Ambulatory Visit (INDEPENDENT_AMBULATORY_CARE_PROVIDER_SITE_OTHER): Payer: MEDICAID | Admitting: Physician Assistant

## 2024-04-29 DIAGNOSIS — M2550 Pain in unspecified joint: Secondary | ICD-10-CM

## 2024-04-29 DIAGNOSIS — E559 Vitamin D deficiency, unspecified: Secondary | ICD-10-CM

## 2024-04-29 NOTE — Addendum Note (Signed)
 Addended by: RODGERS LACY on: 04/29/2024 01:26 PM   Modules accepted: Orders

## 2024-04-29 NOTE — Progress Notes (Signed)
 Office Visit Note   Patient: Sarah Mcclure           Date of Birth: May 22, 1986           MRN: [id number] Visit Date: 04/29/2024              Requested by: No referring provider defined for this encounter. PCP: System, Provider Not In      HPI: Patient is a 38 year old woman who comes in today for follow-up on her vitamin D .  She has been taking 50,000 international units once a week and tolerating it.  She is here to recheck her vitamin D .  Also has concerns because she continues to have polyarthralgia.  She gets occasional joint swelling.  She has had inflammatory labs that have not ruled in any particular inflammatory arthropathy however given the progression of this she is concerned.  She thinks this is getting worse.  Assessment & Plan: Visit Diagnoses:  1. Polyarthralgia     Plan: Will go forward and draw vitamin D  today.  I will put in a referral to rheumatology to get their thoughts.  She is in agreement to this may follow-up as needed will talk to her pending the vitamin D  results  Follow-Up Instructions: Return if symptoms worsen or fail to improve.   Ortho Exam  Patient is alert, oriented, no adenopathy, well-dressed, normal affect, normal respiratory effort.     Imaging: No results found. No images are attached to the encounter.  Labs: Lab Results  Component Value Date   HGBA1C 5.0 12/17/2017   ESRSEDRATE 2 12/12/2022   ESRSEDRATE 1 04/13/2012   LABURIC 3.4 12/12/2022   REPTSTATUS 01/22/2022 FINAL 01/20/2022   CULT MULTIPLE SPECIES PRESENT, SUGGEST RECOLLECTION (A) 01/20/2022     Lab Results  Component Value Date   ALBUMIN 4.0 01/20/2022   ALBUMIN 3.8 03/06/2019   ALBUMIN 4.1 02/16/2018    No results found for: MG Lab Results  Component Value Date   VD25OH 19 (L) 03/09/2024    No results found for: PREALBUMIN    Latest Ref Rng & Units 01/20/2022    5:37 PM 03/06/2019    1:00 AM 02/16/2018   11:19 AM  CBC EXTENDED  WBC 4.0 - 10.5 K/uL  10.2  9.6  10.5   RBC 3.87 - 5.11 MIL/uL 4.31  4.50  4.34   Hemoglobin 12.0 - 15.0 g/dL 85.8  85.8  86.8   HCT 36.0 - 46.0 % 40.4  41.5  38.8   Platelets 150 - 400 K/uL 289  261  300   NEUT# 1.7 - 7.7 K/uL 6.9     Lymph# 0.7 - 4.0 K/uL 2.4        There is no height or weight on file to calculate BMI.  Orders:  Orders Placed This Encounter  Procedures   Ambulatory referral to Rheumatology   No orders of the defined types were placed in this encounter.    Procedures: No procedures performed  Clinical Data: No additional findings.  ROS:  All other systems negative, except as noted in the HPI. Review of Systems  Objective: Vital Signs: There were no vitals taken for this visit.  Specialty Comments:  No specialty comments available.  PMFS History: Patient Active Problem List   Diagnosis Date Noted   Polyarthralgia 12/12/2022   Pain in left hip 12/12/2022   Bipolar 1 disorder (HCC) 03/06/2019   ETOH abuse 03/06/2019   Pain in right foot 04/20/2018   Bipolar affective  disorder, manic, severe, with psychotic behavior (HCC) 01/01/2018   Bipolar affective disorder, currently manic, moderate (HCC) 12/31/2017   Psychosis (HCC)    Bipolar affective disorder, mixed, severe (HCC) 12/27/2017   Bipolar affective disorder (HCC) 12/16/2017   Polysubstance (including opioids) dependence, daily use (HCC) 12/15/2017   Breast lump on left side at 6 o'clock position 06/29/2013   Breast lump on right side at 3 o'clock position 06/29/2013   Cat allergies 05/25/2012   Family history of systemic lupus erythematosus 04/13/2012   Back pain 04/13/2012   Pleuritic chest pain 04/13/2012   Shin splints 04/13/2012   Past Medical History:  Diagnosis Date   Allergy    Asthma    As a child   Bipolar 1 disorder (HCC)    Chronic back pain greater than 3 months duration 2013   Depression    Fibromyalgia     Family History  Problem Relation Age of Onset   Alcohol abuse Mother     Arthritis Mother    Lupus Father    Alcohol abuse Father    Heart disease Father    Depression Father    Heart disease Maternal Grandfather     History reviewed. No pertinent surgical history. Social History   Occupational History   Not on file  Tobacco Use   Smoking status: Every Day    Current packs/day: 1.00    Types: Cigarettes   Smokeless tobacco: Never  Substance and Sexual Activity   Alcohol use: Yes    Alcohol/week: 56.0 standard drinks of alcohol    Types: 56 Shots of liquor per week    Comment: daily   Drug use: Not Currently    Comment: gummies thc   Sexual activity: Yes    Birth control/protection: Condom

## 2024-04-30 LAB — HOUSE ACCOUNT TRACKING

## 2024-04-30 LAB — VITAMIN D 25 HYDROXY (VIT D DEFICIENCY, FRACTURES): Vit D, 25-Hydroxy: 44 ng/mL (ref 30–100)

## 2024-07-14 ENCOUNTER — Ambulatory Visit: Payer: MEDICAID

## 2024-07-14 VITALS — BP 124/90 | HR 99 | Temp 97.9°F | Resp 13 | Ht 61.75 in | Wt 176.6 lb

## 2024-07-14 DIAGNOSIS — M797 Fibromyalgia: Secondary | ICD-10-CM | POA: Insufficient documentation

## 2024-07-14 NOTE — Progress Notes (Signed)
 Office Visit Note  Patient: Sarah Mcclure             Date of Birth: Jul 25, 1986           MRN: [id number]             PCP: System, Provider Not In Referring: Persons, Ronal Dragon, [place] Visit Date: 07/14/2024 Occupation: Data Unavailable  Subjective:  New Patient (Initial Visit) (Joint Pain. Total body pain. Pain feels like an aching and stabbing pain. )   History of Present Illness: Sarah Mcclure is a 38 y.o. female with joint pain.  She admits to issues with flare ups that started after she was bowling. She states that the pain is all over in her joints and bones, and would move around in where they were located. She admits that she has had 10 flare-ups since her symptoms first started. She went off gluten, which didn't help. She also was put on medrol , which also didn't help. She says meloxicam  only helped a little. She states that the flares will last for a few weeks. She admits to RLS at night. She admits to baseline pain all the time, but during these flares it gets worse.   She states the pain is not localized to the joints, and is on the muscles and areas in between. She sometimes feels like a band is squeezing her bones when these occurs. She admits to allodynia since she was younger. She admits to being flexible as a child, but not being double jointed. She admits to non-refreshing sleep. She denies brain fog. She denies any worsening of her symptoms when she did yoga.  She does admit to a dog bite right before her symptoms started.   She denies any rashes. She denies oral ulcers. She admits to recent dry eye due to corneal abrasion, but no dry eyes or dry mouth on a consistent basis. She denies photosensitivity. She denies raynaud's symptoms.   She denies any AM stiffness. She admits to improvement throughout the day when she is active. She admits to hx of bursitis in her hips when she does a lot of activity.  She admits to hx of father with lupus, no other family hx of  autoimmune diseases. Daughter with T1DM  Activities of Daily Living:  Patient reports morning stiffness for 0 minutes.   Patient Reports nocturnal pain.  Difficulty dressing/grooming: Denies Difficulty climbing stairs: Denies Difficulty getting out of chair: Denies Difficulty using hands for taps, buttons, cutlery, and/or writing: Reports  Review of Systems  Constitutional:  Positive for fatigue.  HENT:  Negative for mouth sores and mouth dryness.   Eyes:  Positive for dryness.  Respiratory:  Negative for shortness of breath.   Cardiovascular:  Negative for chest pain and palpitations.  Gastrointestinal:  Negative for blood in stool, constipation and diarrhea.  Endocrine: Negative for increased urination.  Genitourinary:  Negative for involuntary urination.  Musculoskeletal:  Positive for joint pain, joint pain, joint swelling, myalgias, muscle tenderness and myalgias. Negative for gait problem, muscle weakness and morning stiffness.  Skin:  Negative for color change, rash, hair loss and sensitivity to sunlight.  Allergic/Immunologic: Negative for susceptible to infections.  Neurological:  Negative for dizziness and headaches.  Hematological:  Negative for swollen glands.  Psychiatric/Behavioral:  Positive for depressed mood and sleep disturbance. The patient is nervous/anxious.     PMFS History:  Patient Active Problem List   Diagnosis Date Noted   Polyarthralgia 12/12/2022   Pain in left  hip 12/12/2022   Bipolar 1 disorder (HCC) 03/06/2019   ETOH abuse 03/06/2019   Pain in right foot 04/20/2018   Bipolar affective disorder, manic, severe, with psychotic behavior (HCC) 01/01/2018   Bipolar affective disorder, currently manic, moderate (HCC) 12/31/2017   Psychosis (HCC)    Bipolar affective disorder, mixed, severe (HCC) 12/27/2017   Bipolar affective disorder (HCC) 12/16/2017   Polysubstance (including opioids) dependence, daily use (HCC) 12/15/2017   Breast lump on left side  at 6 o'clock position 06/29/2013   Breast lump on right side at 3 o'clock position 06/29/2013   Cat allergies 05/25/2012   Family history of systemic lupus erythematosus 04/13/2012   Back pain 04/13/2012   Pleuritic chest pain 04/13/2012   Shin splints 04/13/2012    Past Medical History:  Diagnosis Date   Allergy    Asthma    As a child   Bipolar 1 disorder (HCC)    Chronic back pain greater than 3 months duration 2013   Depression    Fibromyalgia     Family History  Problem Relation Age of Onset   Alcohol abuse Mother    Arthritis Mother    Lupus Father    Alcohol abuse Father    Heart disease Father    Depression Father    Depression Sister    Heart disease Maternal Grandmother    Heart disease Maternal Grandfather    Diabetes Daughter    Past Surgical History:  Procedure Laterality Date   WISDOM TOOTH EXTRACTION     Social History   Tobacco Use   Smoking status: Every Day    Current packs/day: 1.00    Types: Cigarettes    Passive exposure: Never   Smokeless tobacco: Never  Vaping Use   Vaping status: Former  Substance Use Topics   Alcohol use: Yes    Alcohol/week: 56.0 standard drinks of alcohol    Types: 56 Shots of liquor per week    Comment: daily   Drug use: Not Currently    Comment: gummies thc   Social History   Social History Narrative   Not on file     Immunization History  Administered Date(s) Administered   Tdap 03/29/2023     Objective: Vital Signs: BP (!) 124/90 (BP Location: Left Arm, Patient Position: Sitting, Cuff Size: Small)   Pulse 99   Temp 97.9 F (36.6 C)   Resp 13   Ht 5' 1.75 (1.568 m)   Wt 176 lb 9.6 oz (80.1 kg)   LMP 06/24/2024 (Exact Date)   BMI 32.56 kg/m    Physical Exam Vitals and nursing note reviewed.  HENT:     Head: Normocephalic and atraumatic.     Nose: Nose normal.  Eyes:     Conjunctiva/sclera: Conjunctivae normal.     Pupils: Pupils are equal, round, and reactive to light.  Cardiovascular:      Rate and Rhythm: Normal rate and regular rhythm.  Pulmonary:     Effort: Pulmonary effort is normal. No respiratory distress.  Musculoskeletal:     Comments: Diffuse tenderness to palpation of UE and LE  Skin:    General: Skin is warm and dry.  Neurological:     Mental Status: She is alert. Mental status is at baseline.  Psychiatric:        Mood and Affect: Mood normal.        Behavior: Behavior normal.      Musculoskeletal Exam:   CDAI Exam: CDAI Score: -- Patient Global: --;  Provider Global: -- Swollen: --; Tender: -- Joint Exam 07/14/2024   No joint exam has been documented for this visit     Investigation: No additional findings.  Imaging: No results found.  Recent Labs: Lab Results  Component Value Date   WBC 10.2 01/20/2022   HGB 14.1 01/20/2022   PLT 289 01/20/2022   NA 138 01/20/2022   K 3.7 01/20/2022   CL 104 01/20/2022   CO2 22 01/20/2022   GLUCOSE 123 (H) 01/20/2022   BUN 6 (L) 03/09/2024   CREATININE 0.75 01/20/2022   BILITOT 0.8 01/20/2022   ALKPHOS 60 01/20/2022   AST 66 (H) 01/20/2022   ALT 76 (H) 01/20/2022   PROT 7.4 01/20/2022   ALBUMIN 4.0 01/20/2022   CALCIUM  8.8 (L) 01/20/2022   GFRAA >60 03/06/2019    Speciality Comments: No specialty comments available.  Procedures:  No procedures performed Allergies: Amoxicillin and Penicillins   Assessment / Plan:     Visit Diagnoses:  Fibromyalgia The patient notes fatigue, widespread pain, and allodynia. The patient's history is not concerning for inflammatory joint disease, inflammatory myositis, or PMR. The patient's physical exam has no evidence of inflammatory joint disease. Patient does not have any features of SLE (no objective malar rash, inflammatory arthritis, Raynaud's, alopecia, recurrent cytopenias), rheumatoid arthritis (no evidence of synovitis, negative RF/CCP), Sjogren's disease (no sicca symptoms), scleroderma (no sclerodactyly, no Raynaud's), or Behcet's (no  oral/genital ulcers, no ocular inflammatory disease).    Discussed with patient that fibromyalgia is a chronic pain disorder, thought to be a disorder of pain regulation, and often classified as a form of central sensitization. Fibromyalgia is characterized by widespread musculoskeletal pain, accompanied by fatigue, cognitive disturbances, psychiatric symptoms, headaches, paresthesia's, and multiple somatic symptoms. Fibromyalgia is not an inflammatory or autoimmune condition, and immunosuppressive therapy does not treat fibromyalgia, which also correlates with patient not responding to prednisone therapy. Treatment is both nonpharmacological and pharmacological in nature, and should focus on sleep hygiene, graded exercise programs, pharmacologic therapy (options including TCA (amitriptyline), SNRIs (duloxetine, milnacipran), flexeril, gabapentin /lyrica, naltrexone, TENS), pyschosocial therapy i.e. CBT. The patient will likely benefit from multidisciplinary care by PCP, PT, integrative medicine, psychiatry, and pain management. These recommendations were discussed with the patient.    Orders: No orders of the defined types were placed in this encounter.  minutes. Greater than 50% of time was spent in counseling No orders of the defined types were placed in this encounter.   I personally spent a total of 60 minutes in the care of the patient today including preparing to see the patient, getting/reviewing separately obtained history, performing a medically appropriate exam/evaluation, counseling and educating, placing orders, and documenting clinical information in the EHR.   Follow-Up Instructions: Return if symptoms worsen or fail to improve.   Asberry Claw, DO

## 2024-08-16 ENCOUNTER — Encounter: Payer: Self-pay | Admitting: Radiology

## 2024-09-21 ENCOUNTER — Encounter: Payer: Self-pay | Admitting: Emergency Medicine

## 2024-09-21 ENCOUNTER — Ambulatory Visit
Admission: EM | Admit: 2024-09-21 | Discharge: 2024-09-21 | Disposition: A | Discharge status: Home / Self Care | Payer: MEDICAID

## 2024-09-21 DIAGNOSIS — M778 Other enthesopathies, not elsewhere classified: Secondary | ICD-10-CM

## 2024-09-21 MED ORDER — DICLOFENAC SODIUM 75 MG PO TBEC: 75.0000 mg | DELAYED_RELEASE_TABLET | Freq: Two times a day (BID) | ORAL | 0 refills | Status: AC

## 2024-09-21 NOTE — Discharge Instructions (Signed)

## 2024-09-21 NOTE — ED Provider Notes (Signed)
 [facility]    CSN: [id number] Arrival date & time: 09/21/24  1202      History   Chief Complaint Chief Complaint  Patient presents with   Wrist Pain    R wrist    Hand Pain    R hand     HPI Sarah Mcclure is a 38 y.o. female.   Pt presents today due right wrist pain that she is ranking a 6/10 for the past 5 days. Pt states that she does repetitive motions at work and heard and felt pop in that palm of her hand. Pt states that she has been using ibuprofen  with no relief of pain. Pt states that pain radiates up her right forearm.    The history is provided by the patient.  Wrist Pain  Hand Pain    Past Medical History:  Diagnosis Date   Allergy    Asthma    As a child   Bipolar 1 disorder (HCC)    Chronic back pain greater than 3 months duration 2013   Depression    Fibromyalgia     Patient Active Problem List   Diagnosis Date Noted   Polyarthralgia 12/12/2022   Pain in left hip 12/12/2022   Bipolar 1 disorder (HCC) 03/06/2019   ETOH abuse 03/06/2019   Pain in right foot 04/20/2018   Bipolar affective disorder, manic, severe, with psychotic behavior (HCC) 01/01/2018   Bipolar affective disorder, currently manic, moderate (HCC) 12/31/2017   Psychosis (HCC)    Bipolar affective disorder, mixed, severe (HCC) 12/27/2017   Bipolar affective disorder (HCC) 12/16/2017   Polysubstance (including opioids) dependence, daily use (HCC) 12/15/2017   Breast lump on left side at 6 o'clock position 06/29/2013   Breast lump on right side at 3 o'clock position 06/29/2013   Cat allergies 05/25/2012   Family history of systemic lupus erythematosus 04/13/2012   Back pain 04/13/2012   Pleuritic chest pain 04/13/2012   Shin splints 04/13/2012    Past Surgical History:  Procedure Laterality Date   WISDOM TOOTH EXTRACTION      OB History     Gravida  5   Para  2   Term  2   Preterm      AB  3   Living  2      SAB  2   IAB  1   Ectopic       Multiple      Live Births               Home Medications    Prior to Admission medications   Medication Sig Start Date End Date Taking? Authorizing Provider  diclofenac  (VOLTAREN ) 75 MG EC tablet Take 1 tablet (75 mg total) by mouth 2 (two) times daily. 09/21/24  Yes Andra Krabbe C, PA-C  naproxen  (NAPROSYN ) 250 MG tablet Take 250 mg by mouth.    [provider]  ondansetron  (ZOFRAN ) 4 MG tablet Take 1 tablet (4 mg total) by mouth every 8 (eight) hours as needed for nausea or vomiting. Patient not taking: Reported on 07/14/2024 01/20/22   Tegeler, Lonni PARAS, MD  oxycodone  (OXY-IR) 5 MG capsule Take 1 capsule (5 mg total) by mouth every 4 (four) hours as needed. Patient not taking: Reported on 07/14/2024 01/20/22   Tegeler, Lonni PARAS, MD  sodium chloride  (MURO 128) 5 % ophthalmic ointment Apply 0.25 inches to eye. 03/10/24   [provider]    Family History Family History  Problem Relation  Age of Onset   Alcohol abuse Mother    Arthritis Mother    Lupus Father    Alcohol abuse Father    Heart disease Father    Depression Father    Depression Sister    Heart disease Maternal Grandmother    Heart disease Maternal Grandfather    Diabetes Daughter     Social History Social History   Tobacco Use   Smoking status: Every Day    Current packs/day: 1.00    Types: Cigarettes    Passive exposure: Current   Smokeless tobacco: Never  Vaping Use   Vaping status: Former  Substance Use Topics   Alcohol use: Yes    Alcohol/week: 56.0 standard drinks of alcohol    Types: 56 Shots of liquor per week    Comment: daily   Drug use: Not Currently    Comment: gummies thc     Allergies   Amoxicillin and Penicillins   Review of Systems Review of Systems   Physical Exam Triage Vital Signs ED Triage Vitals  Encounter Vitals Group     BP 09/21/24 1253 (!) 162/98     Girls Systolic BP Percentile --      Girls Diastolic BP Percentile --      Boys  Systolic BP Percentile --      Boys Diastolic BP Percentile --      Pulse Rate 09/21/24 1253 93     Resp 09/21/24 1253 18     Temp 09/21/24 1253 98.8 F (37.1 C)     Temp Source 09/21/24 1253 Oral     SpO2 09/21/24 1253 98 %     Weight 09/21/24 1252 176 lb 9.4 oz (80.1 kg)     Height --      Head Circumference --      Peak Flow --      Pain Score 09/21/24 1252 3     Pain Loc --      Pain Education --      Exclude from Growth Chart --    No data found.  Updated Vital Signs BP (!) 162/98 (BP Location: Left Arm)   Pulse 93   Temp 98.8 F (37.1 C) (Oral)   Resp 18   Wt 176 lb 9.4 oz (80.1 kg)   LMP 08/28/2024 (Approximate)   SpO2 98%   BMI 32.56 kg/m   Visual Acuity Right Eye Distance:   Left Eye Distance:   Bilateral Distance:    Right Eye Near:   Left Eye Near:    Bilateral Near:     Physical Exam Vitals and nursing note reviewed.  Constitutional:      General: She is not in acute distress.    Appearance: Normal appearance. She is not ill-appearing, toxic-appearing or diaphoretic.  Eyes:     General: No scleral icterus. Cardiovascular:     Rate and Rhythm: Normal rate and regular rhythm.     Heart sounds: Normal heart sounds.  Pulmonary:     Effort: Pulmonary effort is normal. No respiratory distress.     Breath sounds: Normal breath sounds. No wheezing or rhonchi.  Musculoskeletal:       Hands:     Comments: Tenderness to palpation where indicated on diagram, full active ROM, pain elicited with active ROM, no bruising or swelling noted  Skin:    General: Skin is warm.  Neurological:     Mental Status: She is alert and oriented to person, place, and time.  Psychiatric:  Mood and Affect: Mood normal.        Behavior: Behavior normal.      UC Treatments / Results  Labs (all labs ordered are listed, but only abnormal results are displayed) Labs Reviewed - No data to display  EKG   Radiology No results found.  Procedures Procedures  (including critical care time)  Medications Ordered in UC Medications - No data to display  Initial Impression / Assessment and Plan / UC Course  I have reviewed the triage vital signs and the nursing notes.  Pertinent labs & imaging results that were available during my care of the patient were reviewed by me and considered in my medical decision making (see chart for details).   Final Clinical Impressions(s) / UC Diagnoses   Final diagnoses:  Tendonitis of wrist, right     Discharge Instructions      Today you have been diagnosed with a musculoskeletal injury.  You should use ice on affected area for 20 minutes at a time a couple times a day for the first 24 hours then you may switch to heat in the same intervals.  Be sure to put a barrier between ice or heat source and skin to prevent burns.  May also wrap affected area and Ace bandage if tolerated and appropriate, and elevate above the level of the heart to help reduce swelling.  Do not wrap Ace bandages around neck or torso as wrapping too tight can restrict air movement inability to breathe.  If symptoms do not seem to be improving in 3 to 5 days after following these instructions we need to follow-up with orthopedist or PCP.    ED Prescriptions     Medication Sig Dispense Auth. Provider   diclofenac  (VOLTAREN ) 75 MG EC tablet Take 1 tablet (75 mg total) by mouth 2 (two) times daily. 30 tablet Andra Corean BROCKS, PA-C      PDMP not reviewed this encounter.   Andra Corean BROCKS, PA-C 09/21/24 1400

## 2024-09-21 NOTE — ED Triage Notes (Signed)
 Pt presents c/o r hand and wrist pain x 5 days. Pt states,  Last Thursday I changed my tires. That night I felt a pop in my pal and then had pain that radiated down the tendon. I have Fibromyalgia so I don't know if I overdid it or what. And this brace is no good. Pt denies injury and/or fall.
